# Patient Record
Sex: Female | Born: 1945
Health system: Southern US, Community
[De-identification: ages and names within clinical notes are randomized; demographics above are authoritative.]

## PROBLEM LIST (undated history)

## (undated) DIAGNOSIS — R519 Headache, unspecified: Secondary | ICD-10-CM

## (undated) DIAGNOSIS — K589 Irritable bowel syndrome without diarrhea: Secondary | ICD-10-CM

## (undated) DIAGNOSIS — M1990 Unspecified osteoarthritis, unspecified site: Secondary | ICD-10-CM

## (undated) DIAGNOSIS — E039 Hypothyroidism, unspecified: Secondary | ICD-10-CM

## (undated) DIAGNOSIS — M2240 Chondromalacia patellae, unspecified knee: Secondary | ICD-10-CM

## (undated) DIAGNOSIS — E119 Type 2 diabetes mellitus without complications: Secondary | ICD-10-CM

## (undated) DIAGNOSIS — J40 Bronchitis, not specified as acute or chronic: Secondary | ICD-10-CM

## (undated) DIAGNOSIS — F419 Anxiety disorder, unspecified: Secondary | ICD-10-CM

## (undated) DIAGNOSIS — T4145XA Adverse effect of unspecified anesthetic, initial encounter: Secondary | ICD-10-CM

## (undated) DIAGNOSIS — I10 Essential (primary) hypertension: Secondary | ICD-10-CM

## (undated) DIAGNOSIS — G2581 Restless legs syndrome: Secondary | ICD-10-CM

## (undated) DIAGNOSIS — T8859XA Other complications of anesthesia, initial encounter: Secondary | ICD-10-CM

## (undated) DIAGNOSIS — K219 Gastro-esophageal reflux disease without esophagitis: Secondary | ICD-10-CM

## (undated) DIAGNOSIS — R51 Headache: Secondary | ICD-10-CM

## (undated) HISTORY — PX: EYE SURGERY: SHX253

## (undated) HISTORY — PX: ABDOMINAL HYSTERECTOMY: SHX81

## (undated) HISTORY — PX: BREAST SURGERY: SHX581

---
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
## 1986-10-02 HISTORY — PX: BREAST EXCISIONAL BIOPSY: SUR124

## 2004-05-11 LAB — HM COLONOSCOPY

## 2005-06-12 ENCOUNTER — Ambulatory Visit: Payer: Self-pay | Admitting: Family Medicine

## 2006-06-19 ENCOUNTER — Ambulatory Visit: Payer: Self-pay | Admitting: Family Medicine

## 2007-06-25 ENCOUNTER — Ambulatory Visit: Payer: Self-pay | Admitting: Family Medicine

## 2008-09-17 ENCOUNTER — Ambulatory Visit: Payer: Self-pay | Admitting: Family Medicine

## 2009-10-04 ENCOUNTER — Ambulatory Visit: Payer: Self-pay | Admitting: Family Medicine

## 2010-01-18 ENCOUNTER — Ambulatory Visit: Payer: Self-pay | Admitting: Otolaryngology

## 2010-10-17 ENCOUNTER — Ambulatory Visit: Payer: Self-pay | Admitting: Family Medicine

## 2011-12-21 DIAGNOSIS — E7800 Pure hypercholesterolemia, unspecified: Secondary | ICD-10-CM | POA: Diagnosis not present

## 2011-12-21 DIAGNOSIS — G43909 Migraine, unspecified, not intractable, without status migrainosus: Secondary | ICD-10-CM | POA: Diagnosis not present

## 2011-12-21 DIAGNOSIS — E119 Type 2 diabetes mellitus without complications: Secondary | ICD-10-CM | POA: Diagnosis not present

## 2011-12-21 DIAGNOSIS — I10 Essential (primary) hypertension: Secondary | ICD-10-CM | POA: Diagnosis not present

## 2012-01-03 ENCOUNTER — Ambulatory Visit: Payer: Self-pay | Admitting: Family Medicine

## 2012-01-03 DIAGNOSIS — Z1231 Encounter for screening mammogram for malignant neoplasm of breast: Secondary | ICD-10-CM | POA: Diagnosis not present

## 2012-03-20 DIAGNOSIS — G2581 Restless legs syndrome: Secondary | ICD-10-CM | POA: Diagnosis not present

## 2012-03-20 DIAGNOSIS — G43909 Migraine, unspecified, not intractable, without status migrainosus: Secondary | ICD-10-CM | POA: Diagnosis not present

## 2012-03-20 DIAGNOSIS — E119 Type 2 diabetes mellitus without complications: Secondary | ICD-10-CM | POA: Diagnosis not present

## 2012-03-20 DIAGNOSIS — I10 Essential (primary) hypertension: Secondary | ICD-10-CM | POA: Diagnosis not present

## 2012-06-20 DIAGNOSIS — E109 Type 1 diabetes mellitus without complications: Secondary | ICD-10-CM | POA: Diagnosis not present

## 2012-06-20 DIAGNOSIS — F411 Generalized anxiety disorder: Secondary | ICD-10-CM | POA: Diagnosis not present

## 2012-06-20 DIAGNOSIS — J069 Acute upper respiratory infection, unspecified: Secondary | ICD-10-CM | POA: Diagnosis not present

## 2012-06-20 DIAGNOSIS — G43909 Migraine, unspecified, not intractable, without status migrainosus: Secondary | ICD-10-CM | POA: Diagnosis not present

## 2012-08-06 DIAGNOSIS — Z23 Encounter for immunization: Secondary | ICD-10-CM | POA: Diagnosis not present

## 2012-09-11 DIAGNOSIS — H2510 Age-related nuclear cataract, unspecified eye: Secondary | ICD-10-CM | POA: Diagnosis not present

## 2012-09-19 DIAGNOSIS — E7800 Pure hypercholesterolemia, unspecified: Secondary | ICD-10-CM | POA: Diagnosis not present

## 2012-09-19 DIAGNOSIS — G2581 Restless legs syndrome: Secondary | ICD-10-CM | POA: Diagnosis not present

## 2012-09-19 DIAGNOSIS — E109 Type 1 diabetes mellitus without complications: Secondary | ICD-10-CM | POA: Diagnosis not present

## 2012-09-19 DIAGNOSIS — F17200 Nicotine dependence, unspecified, uncomplicated: Secondary | ICD-10-CM | POA: Diagnosis not present

## 2012-09-23 DIAGNOSIS — I10 Essential (primary) hypertension: Secondary | ICD-10-CM | POA: Diagnosis not present

## 2012-09-23 DIAGNOSIS — R5381 Other malaise: Secondary | ICD-10-CM | POA: Diagnosis not present

## 2012-09-23 DIAGNOSIS — E785 Hyperlipidemia, unspecified: Secondary | ICD-10-CM | POA: Diagnosis not present

## 2013-01-08 DIAGNOSIS — E7800 Pure hypercholesterolemia, unspecified: Secondary | ICD-10-CM | POA: Diagnosis not present

## 2013-01-08 DIAGNOSIS — E109 Type 1 diabetes mellitus without complications: Secondary | ICD-10-CM | POA: Diagnosis not present

## 2013-01-08 DIAGNOSIS — Z0000 Encounter for general adult medical examination without abnormal findings: Secondary | ICD-10-CM | POA: Diagnosis not present

## 2013-01-08 DIAGNOSIS — G2581 Restless legs syndrome: Secondary | ICD-10-CM | POA: Diagnosis not present

## 2013-01-08 LAB — HM PAP SMEAR: HM Pap smear: NORMAL

## 2013-01-16 DIAGNOSIS — I10 Essential (primary) hypertension: Secondary | ICD-10-CM | POA: Diagnosis not present

## 2013-01-16 DIAGNOSIS — G2581 Restless legs syndrome: Secondary | ICD-10-CM | POA: Diagnosis not present

## 2013-01-16 DIAGNOSIS — E119 Type 2 diabetes mellitus without complications: Secondary | ICD-10-CM | POA: Diagnosis not present

## 2013-01-16 DIAGNOSIS — Z23 Encounter for immunization: Secondary | ICD-10-CM | POA: Diagnosis not present

## 2013-01-16 DIAGNOSIS — F17200 Nicotine dependence, unspecified, uncomplicated: Secondary | ICD-10-CM | POA: Diagnosis not present

## 2013-02-11 ENCOUNTER — Ambulatory Visit: Payer: Self-pay | Admitting: Family Medicine

## 2013-02-11 DIAGNOSIS — Z803 Family history of malignant neoplasm of breast: Secondary | ICD-10-CM | POA: Diagnosis not present

## 2013-02-11 DIAGNOSIS — Z1231 Encounter for screening mammogram for malignant neoplasm of breast: Secondary | ICD-10-CM | POA: Diagnosis not present

## 2013-05-22 DIAGNOSIS — E109 Type 1 diabetes mellitus without complications: Secondary | ICD-10-CM | POA: Diagnosis not present

## 2013-05-22 DIAGNOSIS — G2581 Restless legs syndrome: Secondary | ICD-10-CM | POA: Diagnosis not present

## 2013-05-22 DIAGNOSIS — F411 Generalized anxiety disorder: Secondary | ICD-10-CM | POA: Diagnosis not present

## 2013-05-22 DIAGNOSIS — I10 Essential (primary) hypertension: Secondary | ICD-10-CM | POA: Diagnosis not present

## 2013-06-10 ENCOUNTER — Ambulatory Visit: Payer: Self-pay | Admitting: Family Medicine

## 2013-06-10 DIAGNOSIS — M899 Disorder of bone, unspecified: Secondary | ICD-10-CM | POA: Diagnosis not present

## 2013-06-10 LAB — HM DEXA SCAN

## 2013-07-01 DIAGNOSIS — Z23 Encounter for immunization: Secondary | ICD-10-CM | POA: Diagnosis not present

## 2013-09-30 DIAGNOSIS — E109 Type 1 diabetes mellitus without complications: Secondary | ICD-10-CM | POA: Diagnosis not present

## 2013-09-30 DIAGNOSIS — G43909 Migraine, unspecified, not intractable, without status migrainosus: Secondary | ICD-10-CM | POA: Diagnosis not present

## 2013-09-30 DIAGNOSIS — F17200 Nicotine dependence, unspecified, uncomplicated: Secondary | ICD-10-CM | POA: Diagnosis not present

## 2013-09-30 DIAGNOSIS — G2581 Restless legs syndrome: Secondary | ICD-10-CM | POA: Diagnosis not present

## 2013-10-06 DIAGNOSIS — E785 Hyperlipidemia, unspecified: Secondary | ICD-10-CM | POA: Diagnosis not present

## 2013-10-09 DIAGNOSIS — H2510 Age-related nuclear cataract, unspecified eye: Secondary | ICD-10-CM | POA: Diagnosis not present

## 2014-01-12 DIAGNOSIS — G2581 Restless legs syndrome: Secondary | ICD-10-CM | POA: Diagnosis not present

## 2014-01-12 DIAGNOSIS — G43909 Migraine, unspecified, not intractable, without status migrainosus: Secondary | ICD-10-CM | POA: Diagnosis not present

## 2014-01-12 DIAGNOSIS — E109 Type 1 diabetes mellitus without complications: Secondary | ICD-10-CM | POA: Diagnosis not present

## 2014-01-12 DIAGNOSIS — J441 Chronic obstructive pulmonary disease with (acute) exacerbation: Secondary | ICD-10-CM | POA: Diagnosis not present

## 2014-03-26 ENCOUNTER — Ambulatory Visit: Payer: Self-pay | Admitting: Family Medicine

## 2014-03-26 DIAGNOSIS — Z1231 Encounter for screening mammogram for malignant neoplasm of breast: Secondary | ICD-10-CM | POA: Diagnosis not present

## 2014-03-26 LAB — HM MAMMOGRAPHY: HM MAMMO: NORMAL

## 2014-07-07 DIAGNOSIS — Z23 Encounter for immunization: Secondary | ICD-10-CM | POA: Diagnosis not present

## 2014-07-21 DIAGNOSIS — E119 Type 2 diabetes mellitus without complications: Secondary | ICD-10-CM | POA: Diagnosis not present

## 2014-07-21 DIAGNOSIS — F410 Panic disorder [episodic paroxysmal anxiety] without agoraphobia: Secondary | ICD-10-CM | POA: Diagnosis not present

## 2014-08-31 ENCOUNTER — Ambulatory Visit: Payer: Self-pay | Admitting: Family Medicine

## 2014-08-31 DIAGNOSIS — M25512 Pain in left shoulder: Secondary | ICD-10-CM | POA: Diagnosis not present

## 2014-08-31 DIAGNOSIS — M25511 Pain in right shoulder: Secondary | ICD-10-CM | POA: Diagnosis not present

## 2014-08-31 DIAGNOSIS — M5032 Other cervical disc degeneration, mid-cervical region: Secondary | ICD-10-CM | POA: Diagnosis not present

## 2014-08-31 DIAGNOSIS — M542 Cervicalgia: Secondary | ICD-10-CM | POA: Diagnosis not present

## 2014-08-31 DIAGNOSIS — E119 Type 2 diabetes mellitus without complications: Secondary | ICD-10-CM | POA: Diagnosis not present

## 2014-08-31 DIAGNOSIS — Z23 Encounter for immunization: Secondary | ICD-10-CM | POA: Diagnosis not present

## 2014-08-31 DIAGNOSIS — G8929 Other chronic pain: Secondary | ICD-10-CM | POA: Diagnosis not present

## 2014-08-31 DIAGNOSIS — M5012 Cervical disc disorder with radiculopathy, mid-cervical region: Secondary | ICD-10-CM | POA: Diagnosis not present

## 2014-08-31 DIAGNOSIS — M5410 Radiculopathy, site unspecified: Secondary | ICD-10-CM | POA: Diagnosis not present

## 2014-11-19 DIAGNOSIS — J441 Chronic obstructive pulmonary disease with (acute) exacerbation: Secondary | ICD-10-CM | POA: Diagnosis not present

## 2014-11-19 DIAGNOSIS — M1990 Unspecified osteoarthritis, unspecified site: Secondary | ICD-10-CM | POA: Diagnosis not present

## 2014-11-19 DIAGNOSIS — Z23 Encounter for immunization: Secondary | ICD-10-CM | POA: Diagnosis not present

## 2014-11-19 DIAGNOSIS — E109 Type 1 diabetes mellitus without complications: Secondary | ICD-10-CM | POA: Diagnosis not present

## 2014-11-19 LAB — HEMOGLOBIN A1C: HEMOGLOBIN A1C: 5.9 % (ref 4.0–6.0)

## 2014-11-26 DIAGNOSIS — M7541 Impingement syndrome of right shoulder: Secondary | ICD-10-CM | POA: Diagnosis not present

## 2014-11-26 DIAGNOSIS — M7542 Impingement syndrome of left shoulder: Secondary | ICD-10-CM | POA: Diagnosis not present

## 2014-12-09 DIAGNOSIS — M25511 Pain in right shoulder: Secondary | ICD-10-CM | POA: Diagnosis not present

## 2014-12-09 DIAGNOSIS — M25512 Pain in left shoulder: Secondary | ICD-10-CM | POA: Diagnosis not present

## 2014-12-09 DIAGNOSIS — M25612 Stiffness of left shoulder, not elsewhere classified: Secondary | ICD-10-CM | POA: Diagnosis not present

## 2014-12-09 DIAGNOSIS — M7541 Impingement syndrome of right shoulder: Secondary | ICD-10-CM | POA: Diagnosis not present

## 2014-12-09 DIAGNOSIS — M25611 Stiffness of right shoulder, not elsewhere classified: Secondary | ICD-10-CM | POA: Diagnosis not present

## 2014-12-09 DIAGNOSIS — M7542 Impingement syndrome of left shoulder: Secondary | ICD-10-CM | POA: Diagnosis not present

## 2014-12-11 DIAGNOSIS — M25512 Pain in left shoulder: Secondary | ICD-10-CM | POA: Diagnosis not present

## 2014-12-11 DIAGNOSIS — M25611 Stiffness of right shoulder, not elsewhere classified: Secondary | ICD-10-CM | POA: Diagnosis not present

## 2014-12-11 DIAGNOSIS — M25511 Pain in right shoulder: Secondary | ICD-10-CM | POA: Diagnosis not present

## 2014-12-11 DIAGNOSIS — M25612 Stiffness of left shoulder, not elsewhere classified: Secondary | ICD-10-CM | POA: Diagnosis not present

## 2014-12-11 DIAGNOSIS — M7541 Impingement syndrome of right shoulder: Secondary | ICD-10-CM | POA: Diagnosis not present

## 2014-12-11 DIAGNOSIS — M7542 Impingement syndrome of left shoulder: Secondary | ICD-10-CM | POA: Diagnosis not present

## 2014-12-14 DIAGNOSIS — M25611 Stiffness of right shoulder, not elsewhere classified: Secondary | ICD-10-CM | POA: Diagnosis not present

## 2014-12-14 DIAGNOSIS — M25511 Pain in right shoulder: Secondary | ICD-10-CM | POA: Diagnosis not present

## 2014-12-14 DIAGNOSIS — M7542 Impingement syndrome of left shoulder: Secondary | ICD-10-CM | POA: Diagnosis not present

## 2014-12-14 DIAGNOSIS — M25512 Pain in left shoulder: Secondary | ICD-10-CM | POA: Diagnosis not present

## 2014-12-14 DIAGNOSIS — M25612 Stiffness of left shoulder, not elsewhere classified: Secondary | ICD-10-CM | POA: Diagnosis not present

## 2014-12-14 DIAGNOSIS — M7541 Impingement syndrome of right shoulder: Secondary | ICD-10-CM | POA: Diagnosis not present

## 2014-12-23 DIAGNOSIS — M7542 Impingement syndrome of left shoulder: Secondary | ICD-10-CM | POA: Diagnosis not present

## 2014-12-23 DIAGNOSIS — M25612 Stiffness of left shoulder, not elsewhere classified: Secondary | ICD-10-CM | POA: Diagnosis not present

## 2014-12-23 DIAGNOSIS — M7541 Impingement syndrome of right shoulder: Secondary | ICD-10-CM | POA: Diagnosis not present

## 2014-12-23 DIAGNOSIS — M25611 Stiffness of right shoulder, not elsewhere classified: Secondary | ICD-10-CM | POA: Diagnosis not present

## 2014-12-23 DIAGNOSIS — M25511 Pain in right shoulder: Secondary | ICD-10-CM | POA: Diagnosis not present

## 2014-12-23 DIAGNOSIS — M25512 Pain in left shoulder: Secondary | ICD-10-CM | POA: Diagnosis not present

## 2014-12-24 DIAGNOSIS — I10 Essential (primary) hypertension: Secondary | ICD-10-CM | POA: Diagnosis not present

## 2014-12-24 DIAGNOSIS — M1990 Unspecified osteoarthritis, unspecified site: Secondary | ICD-10-CM | POA: Diagnosis not present

## 2014-12-24 DIAGNOSIS — E119 Type 2 diabetes mellitus without complications: Secondary | ICD-10-CM | POA: Diagnosis not present

## 2014-12-24 DIAGNOSIS — R05 Cough: Secondary | ICD-10-CM | POA: Diagnosis not present

## 2014-12-29 DIAGNOSIS — E785 Hyperlipidemia, unspecified: Secondary | ICD-10-CM | POA: Diagnosis not present

## 2014-12-29 DIAGNOSIS — R5381 Other malaise: Secondary | ICD-10-CM | POA: Diagnosis not present

## 2014-12-29 LAB — LIPID PANEL
Cholesterol: 174 mg/dL (ref 0–200)
HDL: 71 mg/dL — AB (ref 35–70)
LDL Cholesterol: 95 mg/dL
LDl/HDL Ratio: 1.3
TRIGLYCERIDES: 39 mg/dL — AB (ref 40–160)

## 2014-12-29 LAB — BASIC METABOLIC PANEL
BUN: 23 mg/dL — AB (ref 4–21)
Creatinine: 1.1 mg/dL (ref <=1.1)
GLUCOSE: 134 mg/dL
Potassium: 5 mmol/L (ref 3.4–5.3)
SODIUM: 144 mmol/L (ref 137–147)

## 2014-12-29 LAB — HEPATIC FUNCTION PANEL
ALK PHOS: 81 U/L (ref 25–125)
ALT: 6 U/L — AB (ref 7–35)
AST: 12 U/L — AB (ref 13–35)
Bilirubin, Total: 0.2 mg/dL

## 2014-12-29 LAB — CBC AND DIFFERENTIAL
HCT: 40 % (ref 36–46)
Hemoglobin: 13 g/dL (ref 12.0–16.0)
Neutrophils Absolute: 49 /uL
PLATELETS: 325 10*3/uL (ref 150–399)
WBC: 5.8 10^3/mL

## 2014-12-29 LAB — TSH: TSH: 6.85 u[IU]/mL — AB (ref <=5.90)

## 2015-01-07 DIAGNOSIS — M25511 Pain in right shoulder: Secondary | ICD-10-CM | POA: Diagnosis not present

## 2015-01-07 DIAGNOSIS — M25611 Stiffness of right shoulder, not elsewhere classified: Secondary | ICD-10-CM | POA: Diagnosis not present

## 2015-01-07 DIAGNOSIS — M25512 Pain in left shoulder: Secondary | ICD-10-CM | POA: Diagnosis not present

## 2015-01-07 DIAGNOSIS — M7542 Impingement syndrome of left shoulder: Secondary | ICD-10-CM | POA: Diagnosis not present

## 2015-01-07 DIAGNOSIS — M25612 Stiffness of left shoulder, not elsewhere classified: Secondary | ICD-10-CM | POA: Diagnosis not present

## 2015-01-07 DIAGNOSIS — M7541 Impingement syndrome of right shoulder: Secondary | ICD-10-CM | POA: Diagnosis not present

## 2015-01-14 DIAGNOSIS — M7541 Impingement syndrome of right shoulder: Secondary | ICD-10-CM | POA: Diagnosis not present

## 2015-02-05 DIAGNOSIS — G2581 Restless legs syndrome: Secondary | ICD-10-CM | POA: Insufficient documentation

## 2015-02-05 DIAGNOSIS — E785 Hyperlipidemia, unspecified: Secondary | ICD-10-CM | POA: Insufficient documentation

## 2015-02-05 DIAGNOSIS — K219 Gastro-esophageal reflux disease without esophagitis: Secondary | ICD-10-CM | POA: Insufficient documentation

## 2015-02-05 DIAGNOSIS — N183 Chronic kidney disease, stage 3 unspecified: Secondary | ICD-10-CM | POA: Insufficient documentation

## 2015-02-05 DIAGNOSIS — N6019 Diffuse cystic mastopathy of unspecified breast: Secondary | ICD-10-CM | POA: Insufficient documentation

## 2015-02-05 DIAGNOSIS — M2240 Chondromalacia patellae, unspecified knee: Secondary | ICD-10-CM | POA: Insufficient documentation

## 2015-02-05 DIAGNOSIS — Z87891 Personal history of nicotine dependence: Secondary | ICD-10-CM | POA: Insufficient documentation

## 2015-02-05 DIAGNOSIS — K589 Irritable bowel syndrome without diarrhea: Secondary | ICD-10-CM | POA: Insufficient documentation

## 2015-02-05 DIAGNOSIS — M1990 Unspecified osteoarthritis, unspecified site: Secondary | ICD-10-CM | POA: Insufficient documentation

## 2015-02-05 DIAGNOSIS — E039 Hypothyroidism, unspecified: Secondary | ICD-10-CM | POA: Insufficient documentation

## 2015-02-05 DIAGNOSIS — I10 Essential (primary) hypertension: Secondary | ICD-10-CM | POA: Insufficient documentation

## 2015-02-05 DIAGNOSIS — E109 Type 1 diabetes mellitus without complications: Secondary | ICD-10-CM | POA: Insufficient documentation

## 2015-02-05 DIAGNOSIS — F419 Anxiety disorder, unspecified: Secondary | ICD-10-CM | POA: Insufficient documentation

## 2015-02-05 DIAGNOSIS — I878 Other specified disorders of veins: Secondary | ICD-10-CM | POA: Insufficient documentation

## 2015-02-05 DIAGNOSIS — M8580 Other specified disorders of bone density and structure, unspecified site: Secondary | ICD-10-CM | POA: Insufficient documentation

## 2015-02-05 DIAGNOSIS — J441 Chronic obstructive pulmonary disease with (acute) exacerbation: Secondary | ICD-10-CM | POA: Insufficient documentation

## 2015-02-05 DIAGNOSIS — J309 Allergic rhinitis, unspecified: Secondary | ICD-10-CM | POA: Insufficient documentation

## 2015-03-23 ENCOUNTER — Ambulatory Visit (INDEPENDENT_AMBULATORY_CARE_PROVIDER_SITE_OTHER): Payer: Medicare Other | Admitting: Family Medicine

## 2015-03-23 ENCOUNTER — Encounter: Payer: Self-pay | Admitting: Family Medicine

## 2015-03-23 VITALS — BP 142/64 | HR 66 | Temp 98.3°F | Resp 12 | Ht 61.0 in | Wt 125.0 lb

## 2015-03-23 DIAGNOSIS — E109 Type 1 diabetes mellitus without complications: Secondary | ICD-10-CM | POA: Diagnosis not present

## 2015-03-23 DIAGNOSIS — Z87891 Personal history of nicotine dependence: Secondary | ICD-10-CM | POA: Diagnosis not present

## 2015-03-23 DIAGNOSIS — Z0000 Encounter for general adult medical examination without abnormal findings: Secondary | ICD-10-CM

## 2015-03-23 DIAGNOSIS — Z1211 Encounter for screening for malignant neoplasm of colon: Secondary | ICD-10-CM | POA: Diagnosis not present

## 2015-03-23 DIAGNOSIS — I10 Essential (primary) hypertension: Secondary | ICD-10-CM

## 2015-03-23 DIAGNOSIS — E038 Other specified hypothyroidism: Secondary | ICD-10-CM

## 2015-03-23 LAB — POCT UA - MICROALBUMIN: Microalbumin Ur, POC: 20 mg/L

## 2015-03-23 NOTE — Progress Notes (Signed)
Patient ID: WHITNIE DELEON, female   DOB: 02-22-1946, 69 y.o.   MRN: [id number] Patient: Debbie Dennis, Female    DOB: 09-Mar-1946, 69 y.o.   MRN: [id number] Visit Date: 03/23/2015  Today's Provider: Wilhemena Durie, MD   Chief Complaint  Patient presents with  . Medicare Wellness   Subjective:    Annual wellness visit Debbie Dennis is a 69 y.o. female who presents today for her Subsequent Annual Wellness Visit. She feels fairly well. She reports exercising yes. She reports she is sleeping poorly. Due to arm/shouler pain. Her last pap smear done in April 2014 and it was normal-she has had abdominal hysterectomy and still has ovaries. [id number]. Colonoscopy 2005. Mammogram-03/2014. She is up to date on immunizations at this time.  Type 1 diabetes since age 85. All risk factors are treated. Stress to the patient of hypoglycemia this to be avoided as she gets older. Overall patient is doing well. Her husband is having severe anxiety problems and for this reason she retired in the past year. He is actually been referred from local psychiatrist to a psychiatrist in [place] for this issue.  Review of Systems  Constitutional: Negative.   HENT: Negative.   Eyes: Negative.   Respiratory: Negative.   Cardiovascular: Negative.   Gastrointestinal: Negative.   Endocrine: Negative.   Genitourinary: Negative.   Musculoskeletal: Positive for myalgias and arthralgias. Negative for back pain and gait problem.  Skin: Negative.   Allergic/Immunologic: Negative.   Neurological: Positive for numbness (in her legs).  Hematological: Negative.   Psychiatric/Behavioral: Negative.     History   Social History  . Marital Status: Married    Spouse Name: Debbie Dennis  . Number of Children: 1  . Years of Education: 12   Occupational History  . retired    Social History Main Topics  . Smoking status: Former Smoker -- 1.00 packs/day for 35 years    Types: Cigarettes    Quit date: 10/02/2012  . Smokeless tobacco:  Never Used  . Alcohol Use: Yes     Comment: rarely  . Drug Use: No  . Sexual Activity: Not Currently    Birth Control/ Protection: None   Other Topics Concern  . Not on file   Social History Narrative    Patient Active Problem List   Diagnosis Date Noted  . Anxiety 02/05/2015  . Allergic rhinitis 02/05/2015  . Arthritis 02/05/2015  . Acute exacerbation of chronic obstructive airways disease 02/05/2015  . 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 Insulin dependent type 1 diabetes mellitus 02/05/2015  . Bloodgood disease 02/05/2015  . Acid reflux 02/05/2015  . HLD (hyperlipidemia) 02/05/2015  . BP (high blood pressure) 02/05/2015  . Adult hypothyroidism 02/05/2015  . Adaptive colitis 02/05/2015  . Osteopenia 02/05/2015  . Restless leg 02/05/2015  . Current smoker 02/05/2015  . Venous stasis syndrome 02/05/2015    Past Surgical History  Procedure Laterality Date  . Breast biopsy    . Abdominal hysterectomy      has ovaries-due to abnormal pap smears    Her family history includes Arthritis in her sister; Asthma in her maternal grandfather; Breast cancer in her sister; COPD in her father; Cancer in her mother; Heart attack in her mother; Heart disease in her father; Lung cancer in her brother; Throat cancer in her father.    Previous Medications   ALPRAZOLAM (XANAX) 0.5 MG TABLET    Take by mouth.   BUTALBITAL-APAP-CAFFEINE 50-325-40 MG PER CAPSULE    Take by  mouth.   INSULIN GLARGINE (LANTUS) 100 UNIT/ML INJECTION    Inject into the skin. 8 units at night   INSULIN LISPRO (HUMALOG) 100 UNIT/ML INJECTION    Inject into the skin. Sliding scale   LEVOTHYROXINE (SYNTHROID, LEVOTHROID) 50 MCG TABLET    Take by mouth.   LORATADINE (CLARITIN) 10 MG TABLET    Take by mouth.   MECLIZINE (ANTIVERT) 25 MG TABLET    Take by mouth.   MELOXICAM (MOBIC) 7.5 MG TABLET    Take by mouth.   RANITIDINE (ZANTAC) 75 MG TABLET    Take by mouth.   TOPIRAMATE (TOPAMAX) 50 MG TABLET     Take by mouth 2 (two) times daily.     Patient Care Team: Jerrol Banana., MD as PCP - General (Family Medicine)     Objective:   Vitals: BP 142/64 mmHg  Pulse 66  Temp(Src) 98.3 F (36.8 C)  Resp 12  Ht 5\' 1"  (1.549 m)  Wt 125 lb (56.7 kg)  BMI 23.63 kg/m2  Physical Exam  Constitutional: She is oriented to person, place, and time. She appears well-developed and well-nourished. No distress.  HENT:  Head: Normocephalic and atraumatic.  Right Ear: External ear normal.  Left Ear: External ear normal.  Nose: Nose normal.  Mouth/Throat: Oropharynx is clear and moist.  Eyes: Conjunctivae and EOM are normal. Pupils are equal, round, and reactive to light.  Neck: Normal range of motion. Neck supple.  Cardiovascular: Normal rate, regular rhythm, normal heart sounds and intact distal pulses.  Exam reveals no friction rub.   No murmur heard. Pulmonary/Chest: Effort normal and breath sounds normal. She has no wheezes. She exhibits no tenderness. Right breast exhibits no inverted nipple, no mass and no nipple discharge. Left breast exhibits no inverted nipple, no mass and no nipple discharge.  Abdominal: Soft. Bowel sounds are normal. She exhibits no distension. There is no tenderness.  Musculoskeletal: Normal range of motion. She exhibits no edema or tenderness.  Neurological: She is alert and oriented to person, place, and time.  Skin: Skin is warm and dry.  Psychiatric: She has a normal mood and affect. Her behavior is normal. Judgment and thought content normal.    Activities of Daily Living In your present state of health, do you have any difficulty performing the following activities: 03/23/2015 03/23/2015  Hearing? N N  Vision? N N  Difficulty concentrating or making decisions? N N  Walking or climbing stairs? N N  Dressing or bathing? N N  Doing errands, shopping? N N    Fall Risk Assessment Fall Risk  03/23/2015  Falls in the past year? No     Depression  Screen PHQ 2/9 Scores 03/23/2015  PHQ - 2 Score 2  PHQ- 9 Score 4    Cognitive Testing - 6-CIT  Correct? Score   What year is it? yes 0 0 or 4  What month is it? yes 0 0 or 3  Memorize:    Debbie Dennis,  43,  [place],      What time is it? (within 1 hour) yes 0 0 or 3  Count backwards from 20 yes 0 0, 2, or 4  Name the months of the year yes 0 0, 2, or 4  Repeat name & address above no 8 0, 2, 4, 6, 8, or 10       TOTAL SCORE  8/28   Interpretation:  Normal  Normal (0-7) Abnormal (8-28)  Assessment & Plan:     Annual Wellness Visit  Reviewed patient's Family Medical History Reviewed and updated list of patient's medical providers Assessment of cognitive impairment was done Assessed patient's functional ability Established a written schedule for health screening [place] Completed and Reviewed   Immunization History  Administered Date(s) Administered  . Pneumococcal Conjugate-13 08/31/2014  . Pneumococcal Polysaccharide-23 07/11/2007, 01/16/2013  . Tdap 08/25/2008  . Zoster 03/02/2012    Health Maintenance  Topic Date Due  . FOOT EXAM  12/07/1956  . OPHTHALMOLOGY EXAM  12/07/1956  . URINE MICROALBUMIN  12/07/1956  . COLONOSCOPY  05/11/2014  . INFLUENZA VACCINE  05/03/2015  . HEMOGLOBIN A1C  05/20/2015  . MAMMOGRAM  03/26/2016  . TETANUS/TDAP  08/25/2018  . DEXA SCAN  Completed  . ZOSTAVAX  Completed  . PNA vac Low Risk Adult  Completed   BMD in September 2016   Discussed health benefits of physical activity, and encouraged her to engage in regular exercise appropriate for her age and condition.  1. Medicare annual wellness visit, subsequent  2. Colon cancer screening Refer for colonoscopy. - Ambulatory referral to Gastroenterology  3. Essential hypertension Stable.  4. Insulin dependent type 1 diabetes mellitus Urine microalbumin checked today-20. - HgB A1c Type 1 diabetes since age 77. 5. Other specified  hypothyroidism  - TSH  6. History of tobacco abuse  7. Mild cognitive impairment Will follow. Presently normal. Post partial hysterectomy

## 2015-03-24 LAB — HEMOGLOBIN A1C
Est. average glucose Bld gHb Est-mCnc: 131 mg/dL
Hgb A1c MFr Bld: 6.2 % — ABNORMAL HIGH (ref 4.8–5.6)

## 2015-03-24 LAB — TSH: TSH: 1.23 u[IU]/mL (ref 0.450–4.500)

## 2015-03-25 NOTE — Progress Notes (Signed)
Pt advised-aa 

## 2015-03-29 ENCOUNTER — Other Ambulatory Visit: Payer: Self-pay | Admitting: Family Medicine

## 2015-04-12 ENCOUNTER — Other Ambulatory Visit: Payer: Self-pay

## 2015-04-12 MED ORDER — GLUCOSE BLOOD VI STRP: ORAL_STRIP | Status: DC

## 2015-04-14 ENCOUNTER — Other Ambulatory Visit: Payer: Self-pay

## 2015-04-14 ENCOUNTER — Telehealth: Payer: Self-pay | Admitting: Family Medicine

## 2015-04-14 NOTE — Telephone Encounter (Signed)
Needs one touch strips at [employer].

## 2015-04-15 ENCOUNTER — Other Ambulatory Visit: Payer: Self-pay

## 2015-04-19 ENCOUNTER — Other Ambulatory Visit: Payer: Self-pay

## 2015-04-19 MED ORDER — GLUCOSE BLOOD VI STRP: ORAL_STRIP | Status: DC

## 2015-04-28 ENCOUNTER — Other Ambulatory Visit: Payer: Self-pay | Admitting: Family Medicine

## 2015-04-29 DIAGNOSIS — Z1211 Encounter for screening for malignant neoplasm of colon: Secondary | ICD-10-CM | POA: Diagnosis not present

## 2015-05-11 ENCOUNTER — Ambulatory Visit (INDEPENDENT_AMBULATORY_CARE_PROVIDER_SITE_OTHER): Payer: Medicare Other | Admitting: Family Medicine

## 2015-05-11 ENCOUNTER — Encounter: Payer: Self-pay | Admitting: Family Medicine

## 2015-05-11 VITALS — BP 104/60 | HR 82 | Temp 97.6°F | Resp 16 | Wt 127.0 lb

## 2015-05-11 DIAGNOSIS — R05 Cough: Secondary | ICD-10-CM | POA: Diagnosis not present

## 2015-05-11 DIAGNOSIS — B3731 Acute candidiasis of vulva and vagina: Secondary | ICD-10-CM

## 2015-05-11 DIAGNOSIS — R059 Cough, unspecified: Secondary | ICD-10-CM

## 2015-05-11 DIAGNOSIS — E162 Hypoglycemia, unspecified: Secondary | ICD-10-CM | POA: Diagnosis not present

## 2015-05-11 DIAGNOSIS — B373 Candidiasis of vulva and vagina: Secondary | ICD-10-CM

## 2015-05-11 DIAGNOSIS — R4182 Altered mental status, unspecified: Secondary | ICD-10-CM | POA: Diagnosis not present

## 2015-05-11 DIAGNOSIS — K219 Gastro-esophageal reflux disease without esophagitis: Secondary | ICD-10-CM

## 2015-05-11 DIAGNOSIS — J040 Acute laryngitis: Secondary | ICD-10-CM | POA: Diagnosis not present

## 2015-05-11 DIAGNOSIS — N952 Postmenopausal atrophic vaginitis: Secondary | ICD-10-CM | POA: Diagnosis not present

## 2015-05-11 MED ORDER — FLUCONAZOLE 150 MG PO TABS: 150.0000 mg | ORAL_TABLET | Freq: Every day | ORAL | Status: DC

## 2015-05-11 MED ORDER — OMEPRAZOLE 20 MG PO CPDR: 20.0000 mg | DELAYED_RELEASE_CAPSULE | Freq: Every day | ORAL | Status: DC

## 2015-05-11 NOTE — Progress Notes (Signed)
Patient ID: Debbie Dennis, female   DOB: [birth year], 69 y.o.   MRN: [id number]    Subjective:  HPI Pt reports that she started taking Levothyroxine about 3-4 months ago. Ever since she started it she has been having vaginal irritation. She thought it was a yeast infection so she tried 3 different OTC yeast infection meds. Those did not work. She reports that is does not itch or burn it just does feel irritated and is red does not feel swollen. She tried different underwear and several other home remedities to treat this. She also reports that she has had a sore throat and hoarseness about the same time she started Levothyroxine as well. She at first thought it was her allergies. She thinks that it could be related to the medication.   Prior to Admission medications   Medication Sig Start Date End Date Taking? Authorizing Provider  ALPRAZolam Duanne Moron) 0.5 MG tablet Take by mouth. 01/14/15  Yes Historical Provider, MD  Butalbital-APAP-Caffeine [phone] MG per capsule Take by mouth. 04/23/13  Yes Historical Provider, MD  glucose blood (ONE TOUCH ULTRA TEST) test strip Patient to test six (6) times daily 04/19/15  Yes Richard Maceo Pro., MD  insulin glargine (LANTUS) 100 UNIT/ML injection Inject into the skin. 8 units at night 09/29/13  Yes Historical Provider, MD  insulin lispro (HUMALOG) 100 UNIT/ML injection Inject into the skin. Sliding scale 07/16/14  Yes Historical Provider, MD  levothyroxine (SYNTHROID, LEVOTHROID) 50 MCG tablet TAKE ONE TABLET BY MOUTH ONCE DAILY 04/28/15  Yes Jerrol Banana., MD  loratadine (CLARITIN) 10 MG tablet Take by mouth. 06/03/14  Yes Historical Provider, MD  meclizine (ANTIVERT) 25 MG tablet Take by mouth. 04/24/11  Yes Historical Provider, MD  meloxicam (MOBIC) 7.5 MG tablet Take by mouth. 08/31/14  Yes Historical Provider, MD  ranitidine (ZANTAC) 150 MG tablet TAKE ONE TABLET BY MOUTH TWICE DAILY 03/29/15  Yes Jerrol Banana., MD  ranitidine (ZANTAC) 75 MG  tablet Take by mouth. 02/02/14  Yes Historical Provider, MD  topiramate (TOPAMAX) 50 MG tablet Take by mouth 2 (two) times daily.  02/02/14  Yes Historical Provider, MD    Patient Active Problem List   Diagnosis Date Noted  . Anxiety 02/05/2015  . Allergic rhinitis 02/05/2015  . Arthritis 02/05/2015  . Acute exacerbation of chronic obstructive airways disease 02/05/2015  . 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 Insulin dependent type 1 diabetes mellitus 02/05/2015  . Bloodgood disease 02/05/2015  . Acid reflux 02/05/2015  . HLD (hyperlipidemia) 02/05/2015  . BP (high blood pressure) 02/05/2015  . Adult hypothyroidism 02/05/2015  . Adaptive colitis 02/05/2015  . Osteopenia 02/05/2015  . Restless leg 02/05/2015  . History of tobacco abuse 02/05/2015  . Venous stasis syndrome 02/05/2015    History reviewed. No pertinent past medical history.  History   Social History  . Marital Status: Married    Spouse Name: Sonia Side  . Number of Children: 1  . Years of Education: 12   Occupational History  . retired    Social History Main Topics  . Smoking status: Former Smoker -- 1.00 packs/day for 35 years    Types: Cigarettes    Quit date: 10/02/2012  . Smokeless tobacco: Never Used  . Alcohol Use: Yes     Comment: rarely  . Drug Use: No  . Sexual Activity: Not Currently    Birth Control/ Protection: None   Other Topics Concern  . Not on file   Social History Narrative  Allergies  Allergen Reactions  . Codeine   . Sulfa Antibiotics     Review of Systems  Constitutional: Negative.   HENT: Positive for sore throat (and hoarseness).   Eyes: Negative.   Respiratory: Negative.   Cardiovascular: Negative.   Gastrointestinal: Negative.   Genitourinary:       Vaginal irritation  Musculoskeletal: Negative.   Skin: Negative.   Neurological: Negative.   Endo/Heme/Allergies: Negative.   Psychiatric/Behavioral: Negative.     Immunization History  Administered  Date(s) Administered  . Pneumococcal Conjugate-13 08/31/2014  . Pneumococcal Polysaccharide-23 07/11/2007, 01/16/2013  . Tdap 08/25/2008  . Zoster 03/02/2012   Objective:  BP 104/60 mmHg  Pulse 82  Temp(Src) 97.6 F (36.4 C) (Oral)  Resp 16  Wt 127 lb (57.607 kg)  Physical Exam  Constitutional: She is oriented to person, place, and time and well-developed, well-nourished, and in no distress.  HENT:  Head: Normocephalic and atraumatic.  Right Ear: External ear normal.  Left Ear: External ear normal.  Nose: Nose normal.  Mouth/Throat: Oropharynx is clear and moist.  Eyes: Conjunctivae and EOM are normal. Pupils are equal, round, and reactive to light.  Neck: Normal range of motion. Neck supple.  Cardiovascular: Normal rate, regular rhythm, normal heart sounds and intact distal pulses.   Pulmonary/Chest: Effort normal and breath sounds normal.  Abdominal: Soft. Bowel sounds are normal.  Genitourinary: Vagina normal, uterus normal, cervix normal, right adnexa normal and left adnexa normal.  atrophy and mild erythema. Wet prep showed yeast  Neurological: She is alert and oriented to person, place, and time. Gait normal.  Skin: Skin is warm and dry.  Psychiatric: Mood, memory, affect and judgment normal.    Lab Results  Component Value Date   WBC 5.8 12/29/2014   HGB 13.0 12/29/2014   HCT 40 12/29/2014   PLT 325 12/29/2014   CHOL 174 12/29/2014   TRIG 39* 12/29/2014   HDL 71* 12/29/2014   LDLCALC 95 12/29/2014   TSH 1.230 03/23/2015   HGBA1C 6.2* 03/23/2015    CMP     Component Value Date/Time   NA 144 12/29/2014   K 5.0 12/29/2014   BUN 23* 12/29/2014   CREATININE 1.1 12/29/2014   AST 12* 12/29/2014   ALT 6* 12/29/2014   ALKPHOS 81 12/29/2014    Assessment and Plan :  Vaginitis Treat as yeast although exam is normal today. May need gyn referral. Hoarseness Treat as GERD for now. Refer to ENT if not improving as pt is long time smoker. [place] MD [facility] 05/11/2015 12:00 PM

## 2015-05-13 ENCOUNTER — Encounter: Payer: Self-pay | Admitting: *Deleted

## 2015-05-14 ENCOUNTER — Ambulatory Visit: Payer: Medicare Other | Admitting: Anesthesiology

## 2015-05-14 ENCOUNTER — Encounter
Admission: RE | Disposition: A | Discharge status: Home / Self Care | Payer: Self-pay | Source: Ambulatory Visit | Attending: Gastroenterology

## 2015-05-14 ENCOUNTER — Ambulatory Visit
Admission: RE | Admit: 2015-05-14 | Discharge: 2015-05-14 | Disposition: A | Discharge status: Home / Self Care | Payer: Medicare Other | Source: Ambulatory Visit | Attending: Gastroenterology | Admitting: Gastroenterology

## 2015-05-14 ENCOUNTER — Encounter: Payer: Self-pay | Admitting: Anesthesiology

## 2015-05-14 DIAGNOSIS — F419 Anxiety disorder, unspecified: Secondary | ICD-10-CM | POA: Diagnosis not present

## 2015-05-14 DIAGNOSIS — Z79899 Other long term (current) drug therapy: Secondary | ICD-10-CM | POA: Diagnosis not present

## 2015-05-14 DIAGNOSIS — K635 Polyp of colon: Secondary | ICD-10-CM | POA: Diagnosis not present

## 2015-05-14 DIAGNOSIS — Z90710 Acquired absence of both cervix and uterus: Secondary | ICD-10-CM | POA: Diagnosis not present

## 2015-05-14 DIAGNOSIS — E119 Type 2 diabetes mellitus without complications: Secondary | ICD-10-CM | POA: Diagnosis not present

## 2015-05-14 DIAGNOSIS — K640 First degree hemorrhoids: Secondary | ICD-10-CM | POA: Insufficient documentation

## 2015-05-14 DIAGNOSIS — Z885 Allergy status to narcotic agent status: Secondary | ICD-10-CM | POA: Insufficient documentation

## 2015-05-14 DIAGNOSIS — Z87891 Personal history of nicotine dependence: Secondary | ICD-10-CM | POA: Diagnosis not present

## 2015-05-14 DIAGNOSIS — Z803 Family history of malignant neoplasm of breast: Secondary | ICD-10-CM | POA: Diagnosis not present

## 2015-05-14 DIAGNOSIS — Z801 Family history of malignant neoplasm of trachea, bronchus and lung: Secondary | ICD-10-CM | POA: Insufficient documentation

## 2015-05-14 DIAGNOSIS — E039 Hypothyroidism, unspecified: Secondary | ICD-10-CM | POA: Insufficient documentation

## 2015-05-14 DIAGNOSIS — K648 Other hemorrhoids: Secondary | ICD-10-CM | POA: Diagnosis not present

## 2015-05-14 DIAGNOSIS — Z836 Family history of other diseases of the respiratory system: Secondary | ICD-10-CM | POA: Diagnosis not present

## 2015-05-14 DIAGNOSIS — M1990 Unspecified osteoarthritis, unspecified site: Secondary | ICD-10-CM | POA: Insufficient documentation

## 2015-05-14 DIAGNOSIS — Z8261 Family history of arthritis: Secondary | ICD-10-CM | POA: Diagnosis not present

## 2015-05-14 DIAGNOSIS — Z800 Family history of malignant neoplasm of digestive organs: Secondary | ICD-10-CM | POA: Insufficient documentation

## 2015-05-14 DIAGNOSIS — Z825 Family history of asthma and other chronic lower respiratory diseases: Secondary | ICD-10-CM | POA: Diagnosis not present

## 2015-05-14 DIAGNOSIS — D127 Benign neoplasm of rectosigmoid junction: Secondary | ICD-10-CM | POA: Insufficient documentation

## 2015-05-14 DIAGNOSIS — Z8249 Family history of ischemic heart disease and other diseases of the circulatory system: Secondary | ICD-10-CM | POA: Diagnosis not present

## 2015-05-14 DIAGNOSIS — Z882 Allergy status to sulfonamides status: Secondary | ICD-10-CM | POA: Insufficient documentation

## 2015-05-14 DIAGNOSIS — K644 Residual hemorrhoidal skin tags: Secondary | ICD-10-CM | POA: Insufficient documentation

## 2015-05-14 DIAGNOSIS — I25810 Atherosclerosis of coronary artery bypass graft(s) without angina pectoris: Secondary | ICD-10-CM | POA: Diagnosis not present

## 2015-05-14 DIAGNOSIS — G2581 Restless legs syndrome: Secondary | ICD-10-CM | POA: Diagnosis not present

## 2015-05-14 DIAGNOSIS — Z1211 Encounter for screening for malignant neoplasm of colon: Secondary | ICD-10-CM | POA: Insufficient documentation

## 2015-05-14 DIAGNOSIS — I10 Essential (primary) hypertension: Secondary | ICD-10-CM | POA: Insufficient documentation

## 2015-05-14 DIAGNOSIS — K219 Gastro-esophageal reflux disease without esophagitis: Secondary | ICD-10-CM | POA: Insufficient documentation

## 2015-05-14 DIAGNOSIS — I252 Old myocardial infarction: Secondary | ICD-10-CM | POA: Insufficient documentation

## 2015-05-14 DIAGNOSIS — Z794 Long term (current) use of insulin: Secondary | ICD-10-CM | POA: Diagnosis not present

## 2015-05-14 HISTORY — PX: COLONOSCOPY WITH PROPOFOL: SHX5780

## 2015-05-14 HISTORY — DX: Essential (primary) hypertension: I10

## 2015-05-14 HISTORY — DX: Unspecified osteoarthritis, unspecified site: M19.90

## 2015-05-14 HISTORY — DX: Type 2 diabetes mellitus without complications: E11.9

## 2015-05-14 HISTORY — DX: Irritable bowel syndrome, unspecified: K58.9

## 2015-05-14 HISTORY — DX: Anxiety disorder, unspecified: F41.9

## 2015-05-14 HISTORY — DX: Hypothyroidism, unspecified: E03.9

## 2015-05-14 HISTORY — DX: Restless legs syndrome: G25.81

## 2015-05-14 HISTORY — DX: Chondromalacia patellae, unspecified knee: M22.40

## 2015-05-14 HISTORY — DX: Gastro-esophageal reflux disease without esophagitis: K21.9

## 2015-05-14 LAB — GLUCOSE, CAPILLARY: GLUCOSE-CAPILLARY: 214 mg/dL — AB (ref 65–99)

## 2015-05-14 SURGERY — COLONOSCOPY WITH PROPOFOL
Anesthesia: General

## 2015-05-14 MED ORDER — PROPOFOL 10 MG/ML IV BOLUS
INTRAVENOUS | Status: DC | PRN
  Administered 2015-05-14: 70 mg via INTRAVENOUS

## 2015-05-14 MED ORDER — LIDOCAINE HCL (CARDIAC) 20 MG/ML IV SOLN
INTRAVENOUS | Status: DC | PRN
  Administered 2015-05-14: 50 mg via INTRAVENOUS

## 2015-05-14 MED ORDER — PROPOFOL INFUSION 10 MG/ML OPTIME
INTRAVENOUS | Status: DC | PRN
  Administered 2015-05-14: 120 ug/kg/min via INTRAVENOUS

## 2015-05-14 MED ORDER — SODIUM CHLORIDE 0.9 % IV SOLN: INTRAVENOUS | Status: DC

## 2015-05-14 MED ORDER — SODIUM CHLORIDE 0.9 % IV SOLN
INTRAVENOUS | Status: DC
  Administered 2015-05-14: 1000 mL via INTRAVENOUS

## 2015-05-14 NOTE — Anesthesia Preprocedure Evaluation (Signed)
Anesthesia Evaluation  Patient identified by MRN, date of birth, ID band Patient awake    Reviewed: Allergy & Precautions, H&P , NPO status , Patient's Chart, lab work & pertinent test results, reviewed documented beta blocker date and time   History of Anesthesia Complications Negative for: history of anesthetic complications  Airway Mallampati: I  TM Distance: >3 FB Neck ROM: full    Dental no notable dental hx. (+) Teeth Intact   Pulmonary neg shortness of breath, neg sleep apnea, COPD (mild, no treatment)neg recent URI, former smoker,  breath sounds clear to auscultation  Pulmonary exam normal       Cardiovascular Exercise Tolerance: Good hypertension, - angina+ Peripheral Vascular Disease - CAD, - Past MI and - CABG Normal cardiovascular exam- dysrhythmias - Valvular Problems/MurmursRhythm:regular Rate:Normal     Neuro/Psych negative neurological ROS  negative psych ROS   GI/Hepatic Neg liver ROS, GERD-  Medicated and Controlled,  Endo/Other  diabetes, Type 1, Insulin DependentHypothyroidism   Renal/GU negative Renal ROS  negative genitourinary   Musculoskeletal   Abdominal   Peds  Hematology negative hematology ROS (+)   Anesthesia Other Findings Past Medical History:   Anxiety                                                      Arthritis                                                    Buedinger-Ludloff-Laewen disease                             GERD (gastroesophageal reflux disease)                       Diabetes mellitus without complication                       Hypertension                                                 Hypothyroidism                                               Restless leg syndrome                                        Adaptive colitis                                             Reproductive/Obstetrics negative OB ROS  Anesthesia Physical Anesthesia Plan  ASA: III  Anesthesia Plan: General   Post-op Pain Management:    Induction:   Airway Management Planned:   Additional Equipment:   Intra-op Plan:   Post-operative Plan:   Informed Consent: I have reviewed the patients History and Physical, chart, labs and discussed the procedure including the risks, benefits and alternatives for the proposed anesthesia with the patient or authorized representative who has indicated his/her understanding and acceptance.   Dental Advisory Given  Plan Discussed with: Anesthesiologist, CRNA and Surgeon  Anesthesia Plan Comments:         Anesthesia Quick Evaluation

## 2015-05-14 NOTE — Discharge Instructions (Signed)

## 2015-05-14 NOTE — H&P (Signed)
Primary Care Physician:  Wilhemena Durie, MD  Pre-Procedure History & Physical: HPI:  Debbie Dennis is a 69 y.o. female is here for an colonoscopy.   Past Medical History  Diagnosis Date  . Anxiety   . Arthritis   . Buedinger-Ludloff-Laewen disease   . GERD (gastroesophageal reflux disease)   . Diabetes mellitus without complication   . Hypertension   . Hypothyroidism   . Restless leg syndrome   . Adaptive colitis     Past Surgical History  Procedure Laterality Date  . Breast biopsy    . Abdominal hysterectomy      has ovaries-due to abnormal pap smears    Prior to Admission medications   Medication Sig Start Date End Date Taking? Authorizing Provider  ALPRAZolam Duanne Moron) 0.5 MG tablet Take by mouth. 01/14/15  Yes Historical Provider, MD  Butalbital-APAP-Caffeine [phone] MG per capsule Take by mouth. 04/23/13  Yes Historical Provider, MD  cholecalciferol (VITAMIN D) 1000 UNITS tablet Take 1,000 Units by mouth daily.   Yes Historical Provider, MD  fluconazole (DIFLUCAN) 150 MG tablet Take 1 tablet (150 mg total) by mouth daily. For 2 days 05/11/15  Yes Richard Maceo Pro., MD  glucose blood (ONE TOUCH ULTRA TEST) test strip Patient to test six (6) times daily 04/19/15  Yes Richard Maceo Pro., MD  insulin glargine (LANTUS) 100 UNIT/ML injection Inject into the skin. 8 units at night 09/29/13  Yes Historical Provider, MD  insulin lispro (HUMALOG) 100 UNIT/ML injection Inject into the skin. Sliding scale 07/16/14  Yes Historical Provider, MD  levothyroxine (SYNTHROID, LEVOTHROID) 50 MCG tablet TAKE ONE TABLET BY MOUTH ONCE DAILY 04/28/15  Yes Jerrol Banana., MD  meclizine (ANTIVERT) 25 MG tablet Take by mouth. 04/24/11  Yes Historical Provider, MD  omeprazole (PRILOSEC) 20 MG capsule Take 1 capsule (20 mg total) by mouth daily. 05/11/15  Yes Richard Maceo Pro., MD  ranitidine (ZANTAC) 150 MG tablet TAKE ONE TABLET BY MOUTH TWICE DAILY 03/29/15  Yes Jerrol Banana., MD   ranitidine (ZANTAC) 75 MG tablet Take by mouth. 02/02/14  Yes Historical Provider, MD  topiramate (TOPAMAX) 50 MG tablet Take by mouth 2 (two) times daily.  02/02/14  Yes Historical Provider, MD  loratadine (CLARITIN) 10 MG tablet Take by mouth. 06/03/14   Historical Provider, MD  meloxicam (MOBIC) 7.5 MG tablet Take by mouth. 08/31/14   Historical Provider, MD    Allergies as of 05/02/2015 - Review Complete 03/23/2015  Allergen Reaction Noted  . Codeine  02/05/2015  . Sulfa antibiotics  02/05/2015    Family History  Problem Relation Age of Onset  . Cancer Mother     spread into her lungs-died from heart issues   . Heart attack Mother   . COPD Father   . Throat cancer Father   . Heart disease Father   . Arthritis Sister   . Breast cancer Sister   . Lung cancer Brother   . Asthma Maternal Grandfather     Social History   Social History  . Marital Status: Married    Spouse Name: Sonia Side  . Number of Children: 1  . Years of Education: 12   Occupational History  . retired    Social History Main Topics  . Smoking status: Former Smoker -- 1.00 packs/day for 35 years    Types: Cigarettes    Quit date: 10/02/2012  . Smokeless tobacco: Never Used  . Alcohol Use: Yes  Comment: rarely  . Drug Use: No  . Sexual Activity: Not Currently    Birth Control/ Protection: None   Other Topics Concern  . Not on file   Social History Narrative     Physical Exam: BP 110/62 mmHg  Pulse 65  Temp(Src) 96.1 F (35.6 C) (Tympanic)  Resp 18  Ht 5\' 1"  (1.549 m)  Wt 57.607 kg (127 lb)  BMI 24.01 kg/m2  SpO2 100% General:   Alert,  pleasant and cooperative in NAD Head:  Normocephalic and atraumatic. Neck:  Supple; no masses or thyromegaly. Lungs:  Clear throughout to auscultation.    Heart:  Regular rate and rhythm. Abdomen:  Soft, nontender and nondistended. Normal bowel sounds, without guarding, and without rebound.   Neurologic:  Alert and  oriented x4;  grossly normal  neurologically.  Impression/Plan: Debbie Dennis is here for an colonoscopy to be performed for screening  Risks, benefits, limitations, and alternatives regarding  colonoscopy have been reviewed with the patient.  Questions have been answered.  All parties agreeable.   Josefine Class, MD  05/14/2015, 8:17 AM

## 2015-05-14 NOTE — Transfer of Care (Signed)
Immediate Anesthesia Transfer of Care Note  Patient: Debbie Dennis  Procedure(s) Performed: Procedure(s): COLONOSCOPY WITH PROPOFOL (N/A)  Patient Location: Endoscopy Unit  Anesthesia Type:General  Level of Consciousness: awake  Airway & Oxygen Therapy: Patient Spontanous Breathing and Patient connected to nasal cannula oxygen  Post-op Assessment: Report given to RN  Post vital signs: Reviewed  Last Vitals:  Filed Vitals:   05/14/15 0849  BP: 96/76  Pulse: 64  Temp: 36.1 C  Resp: 16    Complications: No apparent anesthesia complications

## 2015-05-14 NOTE — Op Note (Signed)
[facility] Gastroenterology Patient Name: Debbie Dennis Procedure Date: 05/14/2015 8:26 AM MRN: [id number] Account #: [id number] Date of Birth: [birth year] Admit Type: Outpatient Age: 69 Room: [facility] ENDO ROOM 2 Gender: Female Note Status: Finalized Procedure:         Colonoscopy Indications:       Screening for colorectal malignant neoplasm, Last                     colonoscopy 10 years ago Patient Profile:   This is a 70 year old female. Providers:         Gerrit Heck. Rayann Heman, MD Referring MD:      Janine Ores. Rosanna Randy, MD (Referring MD) Medicines:         Propofol per Anesthesia Complications:     No immediate complications. Procedure:         Pre-Anesthesia Assessment:                    - Prior to the procedure, a History and Physical was                     performed, and patient medications, allergies and                     sensitivities were reviewed. The patient's tolerance of                     previous anesthesia was reviewed.                    After obtaining informed consent, the colonoscope was                     passed under direct vision. Throughout the procedure, the                     patient's blood pressure, pulse, and oxygen saturations                     were monitored continuously. The Olympus [id number]                     colonoscope (S#. [id number]) was introduced through the anus                     and advanced to the the cecum, identified by appendiceal                     orifice and ileocecal valve. The colonoscopy was performed                     without difficulty. The patient tolerated the procedure                     well. The quality of the bowel preparation was excellent. Findings:      The perianal exam findings include non-thrombosed external hemorrhoids.      A 2 mm polyp was found in the recto-sigmoid colon. The polyp was       sessile. The polyp was removed with a jumbo cold forceps. Resection and       retrieval were  complete.      Internal hemorrhoids were found during retroflexion. The hemorrhoids       were Grade I (internal hemorrhoids that do not prolapse).  The exam was otherwise without abnormality. Impression:        - Non-thrombosed external hemorrhoids found on perianal                     exam.                    - One 2 mm polyp at the recto-sigmoid colon. Resected and                     retrieved.                    - Internal hemorrhoids.                    - The examination was otherwise normal. Recommendation:    - Observe patient in GI recovery unit.                    - High fiber diet.                    - Continue present medications.                    - Await pathology results.                    - Repeat colonoscopy for surveillance in 5 years if path                     is adenomatous, otherwise repeat for screening in 10 years.                    - Return to referring physician.                    - The findings and recommendations were discussed with the                     patient.                    - The findings and recommendations were discussed with the                     patient's family.                    - - Repeat colonoscopy for surveillance in 5 years if path                     is adenomatous, otherwise repeat for screening in 10 years. Procedure Code(s): --- Professional ---                    [id number], Colonoscopy, flexible; with biopsy, single or                     multiple CPT copyright 2014 American Medical Association. All rights reserved. The codes documented in this report are preliminary and upon coder review may  be revised to meet current compliance requirements. Mellody Life, MD 05/14/2015 8:48:14 AM This report has been signed electronically. Number of Addenda: 0 Note Initiated On: 05/14/2015 8:26 AM Scope Withdrawal Time: 0 hours 9 minutes 36 seconds  Total Procedure Duration: 0 hours 15 minutes 6 seconds       [facility]

## 2015-05-16 NOTE — Anesthesia Postprocedure Evaluation (Signed)
  Anesthesia Post-op Note  Patient: Debbie Dennis  Procedure(s) Performed: Procedure(s): COLONOSCOPY WITH PROPOFOL (N/A)  Anesthesia type:General  Patient location: PACU  Post pain: Pain level controlled  Post assessment: Post-op Vital signs reviewed, Patient's Cardiovascular Status Stable, Respiratory Function Stable, Patent Airway and No signs of Nausea or vomiting  Post vital signs: Reviewed and stable  Last Vitals:  Filed Vitals:   05/14/15 0920  BP: 111/64  Pulse: 59  Temp:   Resp: 19    Level of consciousness: awake, alert  and patient cooperative  Complications: No apparent anesthesia complications

## 2015-05-17 LAB — SURGICAL PATHOLOGY

## 2015-05-20 ENCOUNTER — Other Ambulatory Visit: Payer: Self-pay | Admitting: Family Medicine

## 2015-05-29 ENCOUNTER — Other Ambulatory Visit: Payer: Self-pay | Admitting: Family Medicine

## 2015-06-01 ENCOUNTER — Ambulatory Visit
Admission: RE | Admit: 2015-06-01 | Discharge: 2015-06-01 | Disposition: A | Discharge status: Home / Self Care | Payer: Medicare Other | Source: Ambulatory Visit | Attending: Family Medicine | Admitting: Family Medicine

## 2015-06-01 ENCOUNTER — Ambulatory Visit (INDEPENDENT_AMBULATORY_CARE_PROVIDER_SITE_OTHER): Payer: Medicare Other | Admitting: Family Medicine

## 2015-06-01 ENCOUNTER — Encounter: Payer: Self-pay | Admitting: Family Medicine

## 2015-06-01 VITALS — BP 108/64 | HR 72 | Temp 97.7°F | Resp 16 | Wt 128.8 lb

## 2015-06-01 DIAGNOSIS — J40 Bronchitis, not specified as acute or chronic: Secondary | ICD-10-CM | POA: Diagnosis not present

## 2015-06-01 DIAGNOSIS — R490 Dysphonia: Secondary | ICD-10-CM | POA: Diagnosis not present

## 2015-06-01 DIAGNOSIS — B373 Candidiasis of vulva and vagina: Secondary | ICD-10-CM

## 2015-06-01 DIAGNOSIS — B3731 Acute candidiasis of vulva and vagina: Secondary | ICD-10-CM

## 2015-06-01 DIAGNOSIS — R05 Cough: Secondary | ICD-10-CM

## 2015-06-01 DIAGNOSIS — R059 Cough, unspecified: Secondary | ICD-10-CM

## 2015-06-01 DIAGNOSIS — K219 Gastro-esophageal reflux disease without esophagitis: Secondary | ICD-10-CM

## 2015-06-01 DIAGNOSIS — J449 Chronic obstructive pulmonary disease, unspecified: Secondary | ICD-10-CM | POA: Insufficient documentation

## 2015-06-01 MED ORDER — DOXYCYCLINE HYCLATE 100 MG PO TABS: 100.0000 mg | ORAL_TABLET | Freq: Two times a day (BID) | ORAL | Status: DC

## 2015-06-01 NOTE — Progress Notes (Signed)
Patient ID: Debbie Dennis, female   DOB: 11-Dec-1945, 69 y.o.   MRN: [id number]       Patient: Debbie Dennis Female    DOB: [birth year]   69 y.o.   MRN: [id number] Visit Date: 06/01/2015  Today's Provider: Wilhemena Durie, MD   Chief Complaint  Patient presents with  . Gastrophageal Reflux    follow-up, ov 05/11/2015, started on omeprazole  . Vaginitis    follow-up, ov on 05/11/2015, started on fluconazole, completed   Subjective:    Gastrophageal Reflux She complains of a hoarse voice. She reports no abdominal pain, no chest pain, no coughing, no heartburn or no sore throat. The current episode started 1 to 4 weeks ago. The problem has been unchanged.       Allergies  Allergen Reactions  . Codeine Nausea And Vomiting  . Sulfa Antibiotics Rash   Previous Medications   ALPRAZOLAM (XANAX) 0.5 MG TABLET    Take by mouth.   BUTALBITAL-APAP-CAFFEINE 50-325-40 MG PER CAPSULE    Take by mouth.   CHOLECALCIFEROL (VITAMIN D) 1000 UNITS TABLET    Take 1,000 Units by mouth daily.   FLUCONAZOLE (DIFLUCAN) 150 MG TABLET    Take 1 tablet (150 mg total) by mouth daily. For 2 days   GLUCOSE BLOOD (ONE TOUCH ULTRA TEST) TEST STRIP    Patient to test six (6) times daily   INSULIN GLARGINE (LANTUS) 100 UNIT/ML INJECTION    Inject into the skin. 8 units at night   INSULIN LISPRO (HUMALOG) 100 UNIT/ML INJECTION    Inject into the skin. Sliding scale   LEVOTHYROXINE (SYNTHROID, LEVOTHROID) 50 MCG TABLET    TAKE ONE TABLET BY MOUTH ONCE DAILY   LORATADINE (CLARITIN) 10 MG TABLET    Take by mouth.   MECLIZINE (ANTIVERT) 25 MG TABLET    Take by mouth.   MELOXICAM (MOBIC) 7.5 MG TABLET    Take by mouth.   OMEPRAZOLE (PRILOSEC) 20 MG CAPSULE    Take 1 capsule (20 mg total) by mouth daily.   RANITIDINE (ZANTAC) 150 MG TABLET    TAKE ONE TABLET BY MOUTH TWICE DAILY   RANITIDINE (ZANTAC) 75 MG TABLET    Take by mouth.   TOPIRAMATE (TOPAMAX) 50 MG TABLET    Take by mouth 2 (two) times daily.     Review of  Systems  Constitutional: Negative.   HENT: Positive for hoarse voice. Negative for sore throat.   Eyes: Negative.   Respiratory: Negative for cough.   Cardiovascular: Negative for chest pain.  Gastrointestinal: Negative for heartburn and abdominal pain.  Endocrine: Negative.   Psychiatric/Behavioral: Negative.     Social History  Substance Use Topics  . Smoking status: Former Smoker -- 1.00 packs/day for 35 years    Types: Cigarettes    Quit date: 10/02/2012  . Smokeless tobacco: Never Used  . Alcohol Use: Yes     Comment: rarely   Objective:   BP 108/64 mmHg  Pulse 72  Temp(Src) 97.7 F (36.5 C) (Oral)  Resp 16  Wt 128 lb 12.8 oz (58.423 kg)  Physical Exam  Constitutional: She is oriented to person, place, and time. She appears well-developed and well-nourished.  HENT:  Head: Normocephalic and atraumatic.  Right Ear: External ear normal.  Left Ear: External ear normal.  Nose: Nose normal.  Eyes: Conjunctivae are normal.  Neck: Neck supple.  Cardiovascular: Normal rate, regular rhythm and normal heart sounds.   Pulmonary/Chest: Effort normal and breath sounds normal.  Abdominal: Soft.  Neurological: She is alert and oriented to person, place, and time.  Skin: Skin is warm and dry.  Psychiatric: She has a normal mood and affect. Her behavior is normal. Judgment and thought content normal.        Assessment & Plan:     Cough, treated as presumptive bronchitis.    treated with doxycycline twice a day for one week Hoarseness in a smoker Refer to ENT if this cough does not resolve with the doxycycline. Type 1 diabetes Wilhemena Durie, MD  [facility]

## 2015-06-05 DIAGNOSIS — E162 Hypoglycemia, unspecified: Secondary | ICD-10-CM | POA: Diagnosis not present

## 2015-06-10 DIAGNOSIS — H15101 Unspecified episcleritis, right eye: Secondary | ICD-10-CM | POA: Diagnosis not present

## 2015-06-11 ENCOUNTER — Telehealth: Payer: Self-pay | Admitting: Family Medicine

## 2015-06-11 DIAGNOSIS — J301 Allergic rhinitis due to pollen: Secondary | ICD-10-CM | POA: Diagnosis not present

## 2015-06-11 DIAGNOSIS — Z87891 Personal history of nicotine dependence: Secondary | ICD-10-CM | POA: Diagnosis not present

## 2015-06-11 DIAGNOSIS — J387 Other diseases of larynx: Secondary | ICD-10-CM | POA: Diagnosis not present

## 2015-06-11 DIAGNOSIS — K219 Gastro-esophageal reflux disease without esophagitis: Secondary | ICD-10-CM | POA: Diagnosis not present

## 2015-06-11 DIAGNOSIS — J370 Chronic laryngitis: Secondary | ICD-10-CM | POA: Diagnosis not present

## 2015-06-11 NOTE — Telephone Encounter (Signed)
Pt's husband called wanting to get a (pin) for when  Or if his wife passes out.  He uses [address]  Call back [phone]  Thanks, Con Memos

## 2015-06-14 NOTE — Telephone Encounter (Signed)
Spoke with husband he states he wants a pen for injection for his wife in case she passes out due to her sugars. He states there is a injection for diabetics like that. Please review.-aa

## 2015-06-14 NOTE — Telephone Encounter (Signed)
Referral to new endocrinology will be better. I have set the patient many times before her sugar control is too tight. She needs to see an endocrinologist for her

## 2015-06-16 NOTE — Telephone Encounter (Signed)
Husband advised and he will let patient know and let her make that decision he said-aa

## 2015-06-19 ENCOUNTER — Other Ambulatory Visit: Payer: Self-pay | Admitting: Family Medicine

## 2015-06-19 DIAGNOSIS — J309 Allergic rhinitis, unspecified: Secondary | ICD-10-CM

## 2015-06-19 DIAGNOSIS — F419 Anxiety disorder, unspecified: Secondary | ICD-10-CM

## 2015-06-21 NOTE — Telephone Encounter (Signed)
Last ov was on 06/01/2015 and next ov appointment is on 07/22/2015.  Thanks,

## 2015-06-25 DIAGNOSIS — E161 Other hypoglycemia: Secondary | ICD-10-CM | POA: Diagnosis not present

## 2015-07-20 ENCOUNTER — Other Ambulatory Visit: Payer: Self-pay | Admitting: Family Medicine

## 2015-07-20 DIAGNOSIS — E109 Type 1 diabetes mellitus without complications: Secondary | ICD-10-CM

## 2015-07-22 ENCOUNTER — Encounter: Payer: Self-pay | Admitting: Family Medicine

## 2015-07-22 ENCOUNTER — Ambulatory Visit (INDEPENDENT_AMBULATORY_CARE_PROVIDER_SITE_OTHER): Payer: Medicare Other | Admitting: Family Medicine

## 2015-07-22 VITALS — BP 118/60 | HR 84 | Temp 97.6°F | Resp 16 | Wt 129.0 lb

## 2015-07-22 DIAGNOSIS — Z23 Encounter for immunization: Secondary | ICD-10-CM | POA: Diagnosis not present

## 2015-07-22 DIAGNOSIS — E109 Type 1 diabetes mellitus without complications: Secondary | ICD-10-CM | POA: Diagnosis not present

## 2015-07-22 DIAGNOSIS — E038 Other specified hypothyroidism: Secondary | ICD-10-CM | POA: Diagnosis not present

## 2015-07-22 LAB — POCT GLYCOSYLATED HEMOGLOBIN (HGB A1C): HEMOGLOBIN A1C: 6

## 2015-07-22 NOTE — Progress Notes (Signed)
Patient ID: Debbie Dennis, female   DOB: 11/25/1945, 69 y.o.   MRN: [id number]    Subjective:  HPI  Diabetes Mellitus Type II, Follow-up:   Lab Results  Component Value Date   HGBA1C 6.2* 03/23/2015   HGBA1C 5.9 11/19/2014    Last seen for diabetes 4 months ago.  Management since then includes none. She reports good compliance with treatment. She is not having side effects.  Current symptoms include none. Home blood sugar records: pt reports that her blood sugar has been up and down and does recall numbers  Episodes of hypoglycemia? yes - She feels like she has had more low blood sugars since she started Levothyroxine.   Current Insulin Regimen: lantus 8 units and Humalog- sliding scale Most Recent Eye Exam: more than a year ago Current exercise: walking  Pertinent Labs:    Component Value Date/Time   CHOL 174 12/29/2014   TRIG 39* 12/29/2014   CREATININE 1.1 12/29/2014    Wt Readings from Last 3 Encounters:  07/22/15 129 lb (58.514 kg)  06/01/15 128 lb 12.8 oz (58.423 kg)  05/14/15 127 lb (57.607 kg)    ------------------------------------------------------------------------     Prior to Admission medications   Medication Sig Start Date End Date Taking? Authorizing Provider  ALPRAZolam Duanne Moron) 0.5 MG tablet TAKE ONE TO TWO TABLETS BY MOUTH AT BEDTIME 06/21/15  Yes Jerrol Banana., MD  Butalbital-APAP-Caffeine [phone] MG per capsule Take by mouth. 04/23/13  Yes Historical Provider, MD  cholecalciferol (VITAMIN D) 1000 UNITS tablet Take 1,000 Units by mouth daily.   Yes Historical Provider, MD  glucose blood (ONE TOUCH ULTRA TEST) test strip Patient to test six (6) times daily 04/19/15  Yes Richard Maceo Pro., MD  insulin glargine (LANTUS) 100 UNIT/ML injection Inject into the skin. 8 units at night 09/29/13  Yes Historical Provider, MD  insulin lispro (HUMALOG) 100 UNIT/ML injection Inject into the skin. Sliding scale 07/16/14  Yes Historical Provider, MD    levothyroxine (SYNTHROID, LEVOTHROID) 50 MCG tablet TAKE ONE TABLET BY MOUTH ONCE DAILY 05/31/15  Yes Jerrol Banana., MD  loratadine (CLARITIN) 10 MG tablet TAKE ONE TABLET BY MOUTH ONCE DAILY 06/21/15  Yes Jerrol Banana., MD  meclizine (ANTIVERT) 25 MG tablet Take by mouth. 04/24/11  Yes Historical Provider, MD  meloxicam (MOBIC) 7.5 MG tablet Take by mouth. 08/31/14  Yes Historical Provider, MD  omeprazole (PRILOSEC) 20 MG capsule Take 1 capsule (20 mg total) by mouth daily. 05/11/15  Yes Richard Maceo Pro., MD  ranitidine (ZANTAC) 75 MG tablet Take by mouth. 02/02/14  Yes Historical Provider, MD  topiramate (TOPAMAX) 50 MG tablet Take by mouth 2 (two) times daily.  02/02/14  Yes Historical Provider, MD    Patient Active Problem List   Diagnosis Date Noted  . Anxiety 02/05/2015  . Allergic rhinitis 02/05/2015  . Arthritis 02/05/2015  . Acute exacerbation of chronic obstructive airways disease ([place]) 02/05/2015  . 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 Insulin dependent type 1 diabetes mellitus ([place]) 02/05/2015  . Bloodgood disease 02/05/2015  . Acid reflux 02/05/2015  . HLD (hyperlipidemia) 02/05/2015  . BP (high blood pressure) 02/05/2015  . Adult hypothyroidism 02/05/2015  . Adaptive colitis 02/05/2015  . Osteopenia 02/05/2015  . Restless leg 02/05/2015  . History of tobacco abuse 02/05/2015  . Venous stasis syndrome 02/05/2015    Past Medical History  Diagnosis Date  . Anxiety   . Arthritis   . Buedinger-Ludloff-Laewen disease   .  GERD (gastroesophageal reflux disease)   . Diabetes mellitus without complication ([place])   . Hypertension   . Hypothyroidism   . Restless leg syndrome   . Adaptive colitis     Social History   Social History  . Marital Status: Married    Spouse Name: Sonia Side  . Number of Children: 1  . Years of Education: 12   Occupational History  . retired    Social History Main Topics  . Smoking status: Former Smoker -- 1.00  packs/day for 35 years    Types: Cigarettes    Quit date: 10/02/2012  . Smokeless tobacco: Never Used  . Alcohol Use: Yes     Comment: rarely  . Drug Use: No  . Sexual Activity: Not Currently    Birth Control/ Protection: None   Other Topics Concern  . Not on file   Social History Narrative    Allergies  Allergen Reactions  . Codeine Nausea And Vomiting  . Sulfa Antibiotics Rash    Review of Systems  Constitutional: Negative.   HENT: Negative.   Eyes: Negative.   Respiratory: Negative.   Cardiovascular: Negative.   Gastrointestinal: Negative.   Genitourinary: Negative.   Musculoskeletal: Negative.   Skin: Negative.   Neurological: Negative.   Endo/Heme/Allergies: Negative.   Psychiatric/Behavioral: Negative.     Immunization History  Administered Date(s) Administered  . Pneumococcal Conjugate-13 08/31/2014  . Pneumococcal Polysaccharide-23 07/11/2007, 01/16/2013  . Tdap 08/25/2008  . Zoster 03/02/2012   Objective:  BP 118/60 mmHg  Pulse 84  Temp(Src) 97.6 F (36.4 C) (Oral)  Resp 16  Wt 129 lb (58.514 kg)  Physical Exam  Constitutional: She is oriented to person, place, and time and well-developed, well-nourished, and in no distress.  HENT:  Head: Normocephalic and atraumatic.  Right Ear: External ear normal.  Left Ear: External ear normal.  Nose: Nose normal.  Eyes: Conjunctivae are normal.  Neck: Neck supple.  Cardiovascular: Normal rate, regular rhythm and normal heart sounds.   Pulmonary/Chest: Effort normal and breath sounds normal.  Abdominal: Soft.  Neurological: She is alert and oriented to person, place, and time.  Skin: Skin is warm and dry.  Psychiatric: Mood, memory, affect and judgment normal.    Lab Results  Component Value Date   WBC 5.8 12/29/2014   HGB 13.0 12/29/2014   HCT 40 12/29/2014   PLT 325 12/29/2014   CHOL 174 12/29/2014   TRIG 39* 12/29/2014   HDL 71* 12/29/2014   LDLCALC 95 12/29/2014   TSH 1.230 03/23/2015    HGBA1C 6.2* 03/23/2015    CMP     Component Value Date/Time   NA 144 12/29/2014   K 5.0 12/29/2014   BUN 23* 12/29/2014   CREATININE 1.1 12/29/2014   AST 12* 12/29/2014   ALT 6* 12/29/2014   ALKPHOS 81 12/29/2014    Assessment and Plan :  1. Insulin dependent type 1 diabetes mellitus ([place]) Told pt I strongly desire her to see endocrinology. She refuses. Will have to allow BS to be higher as hypoglycemia is a regular issue which is worsening as she ages. Again stressed the need to see endocrinology. - POCT HgB A1C--6.0 TIDM for 28 years. 2. Other specified hypothyroidism   3. Need for influenza vaccination  - Flu vaccine HIGH DOSE PF  I have done the exam and reviewed the above chart and it is accurate to the best of my knowledge.  Miguel Aschoff MD [facility] 07/22/2015 3:58 PM

## 2015-07-26 ENCOUNTER — Other Ambulatory Visit: Payer: Self-pay | Admitting: Family Medicine

## 2015-08-24 ENCOUNTER — Other Ambulatory Visit: Payer: Self-pay | Admitting: Family Medicine

## 2015-08-31 ENCOUNTER — Other Ambulatory Visit: Payer: Self-pay | Admitting: Family Medicine

## 2015-08-31 DIAGNOSIS — Z1231 Encounter for screening mammogram for malignant neoplasm of breast: Secondary | ICD-10-CM

## 2015-09-02 ENCOUNTER — Ambulatory Visit
Admission: RE | Admit: 2015-09-02 | Discharge: 2015-09-02 | Disposition: A | Discharge status: Home / Self Care | Payer: Medicare Other | Source: Ambulatory Visit | Attending: Family Medicine | Admitting: Family Medicine

## 2015-09-02 ENCOUNTER — Other Ambulatory Visit: Payer: Self-pay | Admitting: Family Medicine

## 2015-09-02 DIAGNOSIS — Z1231 Encounter for screening mammogram for malignant neoplasm of breast: Secondary | ICD-10-CM | POA: Insufficient documentation

## 2015-10-21 DIAGNOSIS — E119 Type 2 diabetes mellitus without complications: Secondary | ICD-10-CM | POA: Diagnosis not present

## 2015-10-29 ENCOUNTER — Encounter: Payer: Self-pay | Admitting: Family Medicine

## 2015-11-09 ENCOUNTER — Telehealth: Payer: Self-pay | Admitting: Family Medicine

## 2015-11-09 ENCOUNTER — Other Ambulatory Visit: Payer: Self-pay

## 2015-11-09 MED ORDER — INSULIN ASPART 100 UNIT/ML ~~LOC~~ SOLN: SUBCUTANEOUS | Status: DC

## 2015-11-09 NOTE — Telephone Encounter (Signed)
Ok to [employer] dosing.

## 2015-11-09 NOTE — Telephone Encounter (Signed)
Pt stated her insurance will no longer cover her insulin lispro (HUMALOG) 100 UNIT/ML injection she would like it changed to Novalog and send it to [place]. Pt would like it done today if possible. Thanks TNP

## 2015-11-09 NOTE — Telephone Encounter (Signed)
Ok to send? Please advise. Thanks!

## 2015-11-09 NOTE — Telephone Encounter (Signed)
Pt advised and RX sent in and updated in the chart-aa

## 2015-11-22 ENCOUNTER — Ambulatory Visit (INDEPENDENT_AMBULATORY_CARE_PROVIDER_SITE_OTHER): Payer: Medicare Other | Admitting: Family Medicine

## 2015-11-22 VITALS — BP 130/68 | HR 76 | Temp 98.1°F | Resp 16 | Wt 127.0 lb

## 2015-11-22 DIAGNOSIS — E109 Type 1 diabetes mellitus without complications: Secondary | ICD-10-CM | POA: Diagnosis not present

## 2015-11-22 DIAGNOSIS — E785 Hyperlipidemia, unspecified: Secondary | ICD-10-CM

## 2015-11-22 DIAGNOSIS — I10 Essential (primary) hypertension: Secondary | ICD-10-CM

## 2015-11-22 NOTE — Progress Notes (Signed)
Patient ID: Debbie Dennis, female   DOB: 06-28-1946, 70 y.o.   MRN: [id number]   Debbie Dennis  MRN: [id number] DOB: March 17, 1946  Subjective:  HPI   1. Essential hypertension The patient is a 70 year old female who presents for follow up of her hypertension.  She was last seen on 07/22/15.  Her blood pressure was 118/60 at that time and no management changes were made.    2. Insulin dependent type 1 diabetes mellitus ([place]) The patient is also here to follow up on her Type I diabetes.  She states her glucose when checked ranges from in the 50"s-200.  She could not say how often she had low glucose levels, possibly weekly.   Patient Active Problem List   Diagnosis Date Noted  . Anxiety 02/05/2015  . Allergic rhinitis 02/05/2015  . Arthritis 02/05/2015  . Acute exacerbation of chronic obstructive airways disease ([place]) 02/05/2015  . 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 Insulin dependent type 1 diabetes mellitus ([place]) 02/05/2015  . Bloodgood disease 02/05/2015  . Acid reflux 02/05/2015  . HLD (hyperlipidemia) 02/05/2015  . BP (high blood pressure) 02/05/2015  . Adult hypothyroidism 02/05/2015  . Adaptive colitis 02/05/2015  . Osteopenia 02/05/2015  . Restless leg 02/05/2015  . History of tobacco abuse 02/05/2015  . Venous stasis syndrome 02/05/2015    Past Medical History  Diagnosis Date  . Anxiety   . Arthritis   . Buedinger-Ludloff-Laewen disease   . GERD (gastroesophageal reflux disease)   . Diabetes mellitus without complication ([place])   . Hypertension   . Hypothyroidism   . Restless leg syndrome   . Adaptive colitis     Social History   Social History  . Marital Status: Married    Spouse Name: Sonia Side  . Number of Children: 1  . Years of Education: 12   Occupational History  . retired    Social History Main Topics  . Smoking status: Former Smoker -- 1.00 packs/day for 35 years    Types: Cigarettes    Quit date: 10/02/2012  . Smokeless tobacco: Never Used   . Alcohol Use: Yes     Comment: rarely  . Drug Use: No  . Sexual Activity: Not Currently    Birth Control/ Protection: None   Other Topics Concern  . Not on file   Social History Narrative    Outpatient Prescriptions Prior to Visit  Medication Sig Dispense Refill  . ALPRAZolam (XANAX) 0.5 MG tablet TAKE ONE TO TWO TABLETS BY MOUTH AT BEDTIME 60 tablet 5  . Butalbital-APAP-Caffeine 50-325-40 MG per capsule Take by mouth.    Marland Kitchen glucose blood (ONE TOUCH ULTRA TEST) test strip Patient to test six (6) times daily 200 each 12  . insulin aspart (NOVOLOG) 100 UNIT/ML injection Sliding scale- up to 60 units daily DX: E11.9 10 mL 11  . LANTUS 100 UNIT/ML injection INJECT 10 UNITS SUBCUTANEOUSLY AT BEDTIME 10 mL 12  . levothyroxine (SYNTHROID, LEVOTHROID) 50 MCG tablet TAKE ONE TABLET BY MOUTH ONCE DAILY 30 tablet 12  . loratadine (CLARITIN) 10 MG tablet TAKE ONE TABLET BY MOUTH ONCE DAILY 30 tablet 12  . meclizine (ANTIVERT) 25 MG tablet Take by mouth.    . meloxicam (MOBIC) 7.5 MG tablet Take by mouth.    Marland Kitchen omeprazole (PRILOSEC) 20 MG capsule Take 1 capsule (20 mg total) by mouth daily. 30 capsule 12  . ranitidine (ZANTAC) 150 MG tablet TAKE ONE TABLET BY MOUTH TWICE DAILY 60 tablet 12  . ranitidine (  ZANTAC) 75 MG tablet Take by mouth.    . topiramate (TOPAMAX) 50 MG tablet Take by mouth 2 (two) times daily.     . cholecalciferol (VITAMIN D) 1000 UNITS tablet Take 1,000 Units by mouth daily. Reported on 11/22/2015     No facility-administered medications prior to visit.    Allergies  Allergen Reactions  . Codeine Nausea And Vomiting  . Sulfa Antibiotics Rash    Review of Systems  Constitutional: Positive for malaise/fatigue. Negative for fever, chills and diaphoresis.  Respiratory: Negative for cough, hemoptysis, sputum production, shortness of breath and wheezing.   Cardiovascular: Positive for leg swelling. Negative for chest pain, palpitations and orthopnea.  Neurological:  Negative for weakness.  Endo/Heme/Allergies: Negative for polydipsia.   Objective:  BP 130/68 mmHg  Pulse 76  Temp(Src) 98.1 F (36.7 C) (Oral)  Resp 16  Wt 127 lb (57.607 kg)  Physical Exam  Assessment and Plan :  Essential hypertension  Insulin dependent type 1 diabetes mellitus (HCC) Labile, brittle type 1 diabetes area and I stressed again the importance of endocrinology referral and patient refuses. She checks her blood sugar about 12 times a day because she is so brittle. I again stressed to her up and rather her sugars run a little bit higher at age 48 then having very tight control. Again, stressed importance or referral to endocrinology. Patient refuses. More than 50% of this time is spent in counseling. Hypothyroid Hyperlipidemia Consider adding statin therapy even though LDL was below 100. GERD I have done the exam and reviewed the above chart and it is accurate to the best of my knowledge.  Miguel Aschoff MD [facility] 11/22/2015 3:43 PM

## 2015-11-23 DIAGNOSIS — E161 Other hypoglycemia: Secondary | ICD-10-CM | POA: Diagnosis not present

## 2015-11-26 DIAGNOSIS — H2513 Age-related nuclear cataract, bilateral: Secondary | ICD-10-CM | POA: Diagnosis not present

## 2015-11-26 DIAGNOSIS — E109 Type 1 diabetes mellitus without complications: Secondary | ICD-10-CM | POA: Diagnosis not present

## 2015-11-26 DIAGNOSIS — E785 Hyperlipidemia, unspecified: Secondary | ICD-10-CM | POA: Diagnosis not present

## 2015-11-26 DIAGNOSIS — I10 Essential (primary) hypertension: Secondary | ICD-10-CM | POA: Diagnosis not present

## 2015-11-27 LAB — CBC WITH DIFFERENTIAL/PLATELET
BASOS ABS: 0 10*3/uL (ref 0.0–0.2)
BASOS: 1 %
EOS (ABSOLUTE): 0.1 10*3/uL (ref 0.0–0.4)
Eos: 2 %
HEMOGLOBIN: 13.3 g/dL (ref 11.1–15.9)
Hematocrit: 40.9 % (ref 34.0–46.6)
IMMATURE GRANS (ABS): 0 10*3/uL (ref 0.0–0.1)
Immature Granulocytes: 0 %
LYMPHS ABS: 2 10*3/uL (ref 0.7–3.1)
Lymphs: 33 %
MCH: 31.9 pg (ref 26.6–33.0)
MCHC: 32.5 g/dL (ref 31.5–35.7)
MCV: 98 fL — AB (ref 79–97)
Monocytes Absolute: 0.5 10*3/uL (ref 0.1–0.9)
Monocytes: 9 %
NEUTROS ABS: 3.2 10*3/uL (ref 1.4–7.0)
Neutrophils: 55 %
PLATELETS: 292 10*3/uL (ref 150–379)
RBC: 4.17 x10E6/uL (ref 3.77–5.28)
RDW: 13.8 % (ref 12.3–15.4)
WBC: 5.9 10*3/uL (ref 3.4–10.8)

## 2015-11-27 LAB — COMPREHENSIVE METABOLIC PANEL
A/G RATIO: 2.4 (ref 1.1–2.5)
ALBUMIN: 4 g/dL (ref 3.6–4.8)
ALK PHOS: 87 IU/L (ref 39–117)
ALT: 9 IU/L (ref 0–32)
AST: 16 IU/L (ref 0–40)
BILIRUBIN TOTAL: 0.4 mg/dL (ref 0.0–1.2)
BUN / CREAT RATIO: 12 (ref 11–26)
BUN: 13 mg/dL (ref 8–27)
CHLORIDE: 104 mmol/L (ref 96–106)
CO2: 21 mmol/L (ref 18–29)
Calcium: 9.2 mg/dL (ref 8.7–10.3)
Creatinine, Ser: 1.13 mg/dL — ABNORMAL HIGH (ref 0.57–1.00)
GFR calc non Af Amer: 50 mL/min/{1.73_m2} — ABNORMAL LOW (ref >59)
GFR, EST AFRICAN AMERICAN: 58 mL/min/{1.73_m2} — AB (ref >59)
GLUCOSE: 227 mg/dL — AB (ref 65–99)
Globulin, Total: 1.7 g/dL (ref 1.5–4.5)
POTASSIUM: 5 mmol/L (ref 3.5–5.2)
Sodium: 141 mmol/L (ref 134–144)
TOTAL PROTEIN: 5.7 g/dL — AB (ref 6.0–8.5)

## 2015-11-27 LAB — LIPID PANEL WITH LDL/HDL RATIO
CHOLESTEROL TOTAL: 163 mg/dL (ref 100–199)
HDL: 57 mg/dL (ref >39)
LDL CALC: 93 mg/dL (ref 0–99)
LDl/HDL Ratio: 1.6 ratio units (ref 0.0–3.2)
TRIGLYCERIDES: 63 mg/dL (ref 0–149)
VLDL Cholesterol Cal: 13 mg/dL (ref 5–40)

## 2015-11-27 LAB — HEMOGLOBIN A1C
ESTIMATED AVERAGE GLUCOSE: 126 mg/dL
HEMOGLOBIN A1C: 6 % — AB (ref 4.8–5.6)

## 2015-12-10 ENCOUNTER — Other Ambulatory Visit: Payer: Self-pay

## 2015-12-10 MED ORDER — GLUCOSE BLOOD VI STRP: ORAL_STRIP | Status: DC

## 2015-12-24 ENCOUNTER — Other Ambulatory Visit: Payer: Self-pay | Admitting: Family Medicine

## 2015-12-31 DIAGNOSIS — H2513 Age-related nuclear cataract, bilateral: Secondary | ICD-10-CM | POA: Diagnosis not present

## 2016-01-11 ENCOUNTER — Encounter: Payer: Self-pay | Admitting: *Deleted

## 2016-01-20 ENCOUNTER — Encounter
Admission: RE | Disposition: A | Discharge status: Home / Self Care | Payer: Self-pay | Source: Ambulatory Visit | Attending: Ophthalmology

## 2016-01-20 ENCOUNTER — Ambulatory Visit: Payer: Medicare Other | Admitting: Anesthesiology

## 2016-01-20 ENCOUNTER — Ambulatory Visit
Admission: RE | Admit: 2016-01-20 | Discharge: 2016-01-20 | Disposition: A | Discharge status: Home / Self Care | Payer: Medicare Other | Source: Ambulatory Visit | Attending: Ophthalmology | Admitting: Ophthalmology

## 2016-01-20 DIAGNOSIS — K219 Gastro-esophageal reflux disease without esophagitis: Secondary | ICD-10-CM | POA: Diagnosis not present

## 2016-01-20 DIAGNOSIS — F419 Anxiety disorder, unspecified: Secondary | ICD-10-CM | POA: Diagnosis not present

## 2016-01-20 DIAGNOSIS — Z90710 Acquired absence of both cervix and uterus: Secondary | ICD-10-CM | POA: Insufficient documentation

## 2016-01-20 DIAGNOSIS — Z87891 Personal history of nicotine dependence: Secondary | ICD-10-CM | POA: Diagnosis not present

## 2016-01-20 DIAGNOSIS — M4692 Unspecified inflammatory spondylopathy, cervical region: Secondary | ICD-10-CM | POA: Diagnosis not present

## 2016-01-20 DIAGNOSIS — Z885 Allergy status to narcotic agent status: Secondary | ICD-10-CM | POA: Diagnosis not present

## 2016-01-20 DIAGNOSIS — Z882 Allergy status to sulfonamides status: Secondary | ICD-10-CM | POA: Insufficient documentation

## 2016-01-20 DIAGNOSIS — I10 Essential (primary) hypertension: Secondary | ICD-10-CM | POA: Insufficient documentation

## 2016-01-20 DIAGNOSIS — H2512 Age-related nuclear cataract, left eye: Secondary | ICD-10-CM | POA: Insufficient documentation

## 2016-01-20 DIAGNOSIS — E079 Disorder of thyroid, unspecified: Secondary | ICD-10-CM | POA: Diagnosis not present

## 2016-01-20 DIAGNOSIS — M19011 Primary osteoarthritis, right shoulder: Secondary | ICD-10-CM | POA: Insufficient documentation

## 2016-01-20 DIAGNOSIS — G2581 Restless legs syndrome: Secondary | ICD-10-CM | POA: Diagnosis not present

## 2016-01-20 DIAGNOSIS — M19012 Primary osteoarthritis, left shoulder: Secondary | ICD-10-CM | POA: Diagnosis not present

## 2016-01-20 DIAGNOSIS — G43909 Migraine, unspecified, not intractable, without status migrainosus: Secondary | ICD-10-CM | POA: Insufficient documentation

## 2016-01-20 DIAGNOSIS — J449 Chronic obstructive pulmonary disease, unspecified: Secondary | ICD-10-CM | POA: Insufficient documentation

## 2016-01-20 DIAGNOSIS — E119 Type 2 diabetes mellitus without complications: Secondary | ICD-10-CM | POA: Diagnosis not present

## 2016-01-20 HISTORY — PX: CATARACT EXTRACTION W/PHACO: SHX586

## 2016-01-20 HISTORY — DX: Bronchitis, not specified as acute or chronic: J40

## 2016-01-20 HISTORY — DX: Headache: R51

## 2016-01-20 HISTORY — DX: Headache, unspecified: R51.9

## 2016-01-20 LAB — GLUCOSE, CAPILLARY: Glucose-Capillary: 166 mg/dL — ABNORMAL HIGH (ref 65–99)

## 2016-01-20 SURGERY — PHACOEMULSIFICATION, CATARACT, WITH IOL INSERTION
Anesthesia: Monitor Anesthesia Care | Site: Eye | Laterality: Left | Wound class: Clean

## 2016-01-20 MED ORDER — SODIUM CHLORIDE 0.9 % IV SOLN
INTRAVENOUS | Status: DC
  Administered 2016-01-20: 06:00:00 via INTRAVENOUS

## 2016-01-20 MED ORDER — MIDAZOLAM HCL 2 MG/2ML IJ SOLN
INTRAMUSCULAR | Status: DC | PRN
  Administered 2016-01-20: 1 mg via INTRAVENOUS

## 2016-01-20 MED ORDER — TETRACAINE HCL 0.5 % OP SOLN
1.0000 [drp] | Freq: Once | OPHTHALMIC | Status: AC
  Administered 2016-01-20: 1 [drp] via OPHTHALMIC

## 2016-01-20 MED ORDER — BUPIVACAINE HCL (PF) 0.75 % IJ SOLN
INTRAMUSCULAR | Status: AC
  Filled 2016-01-20: qty 10

## 2016-01-20 MED ORDER — POVIDONE-IODINE 5 % OP SOLN
1.0000 "application " | Freq: Once | OPHTHALMIC | Status: AC
  Administered 2016-01-20: 1 via OPHTHALMIC

## 2016-01-20 MED ORDER — CEFUROXIME OPHTHALMIC INJECTION 1 MG/0.1 ML
INJECTION | OPHTHALMIC | Status: AC
  Filled 2016-01-20: qty 0.1

## 2016-01-20 MED ORDER — MOXIFLOXACIN HCL 0.5 % OP SOLN: 1.0000 [drp] | OPHTHALMIC | Status: DC | PRN

## 2016-01-20 MED ORDER — FENTANYL CITRATE (PF) 100 MCG/2ML IJ SOLN
INTRAMUSCULAR | Status: DC | PRN
  Administered 2016-01-20: 50 ug via INTRAVENOUS

## 2016-01-20 MED ORDER — ONDANSETRON HCL 4 MG/2ML IJ SOLN
INTRAMUSCULAR | Status: DC | PRN
Start: 2016-01-20 — End: 2016-01-20
  Administered 2016-01-20: 4 mg via INTRAVENOUS

## 2016-01-20 MED ORDER — MOXIFLOXACIN HCL 0.5 % OP SOLN
OPHTHALMIC | Status: AC
  Filled 2016-01-20: qty 3

## 2016-01-20 MED ORDER — ARMC OPHTHALMIC DILATING GEL
OPHTHALMIC | Status: AC
  Administered 2016-01-20: 1 via OPHTHALMIC
  Filled 2016-01-20: qty 0.25

## 2016-01-20 MED ORDER — ARMC OPHTHALMIC DILATING GEL
1.0000 "application " | OPHTHALMIC | Status: AC | PRN
  Administered 2016-01-20 (×2): 1 via OPHTHALMIC

## 2016-01-20 MED ORDER — NEOMYCIN-POLYMYXIN-DEXAMETH 3.5-10000-0.1 OP OINT
TOPICAL_OINTMENT | OPHTHALMIC | Status: AC
  Filled 2016-01-20: qty 3.5

## 2016-01-20 MED ORDER — LIDOCAINE HCL (PF) 4 % IJ SOLN
INTRAMUSCULAR | Status: DC | PRN
  Administered 2016-01-20: 4 mL via OPHTHALMIC

## 2016-01-20 MED ORDER — EPINEPHRINE HCL 1 MG/ML IJ SOLN
INTRAOCULAR | Status: DC | PRN
  Administered 2016-01-20: 250 mL via OPHTHALMIC

## 2016-01-20 MED ORDER — LIDOCAINE HCL (PF) 4 % IJ SOLN
INTRAMUSCULAR | Status: AC
  Filled 2016-01-20: qty 5

## 2016-01-20 MED ORDER — NA HYALUR & NA CHOND-NA HYALUR 0.4-0.35 ML IO KIT
PACK | INTRAOCULAR | Status: DC | PRN
  Administered 2016-01-20: .75 mL via INTRAOCULAR

## 2016-01-20 MED ORDER — TETRACAINE HCL 0.5 % OP SOLN
OPHTHALMIC | Status: AC
  Administered 2016-01-20: 1 [drp] via OPHTHALMIC
  Filled 2016-01-20: qty 2

## 2016-01-20 MED ORDER — NEOMYCIN-POLYMYXIN-DEXAMETH 0.1 % OP OINT
TOPICAL_OINTMENT | OPHTHALMIC | Status: DC | PRN
  Administered 2016-01-20: 1 via OPHTHALMIC

## 2016-01-20 MED ORDER — CEFUROXIME OPHTHALMIC INJECTION 1 MG/0.1 ML
INJECTION | OPHTHALMIC | Status: DC | PRN
  Administered 2016-01-20: 0.1 mL via INTRACAMERAL

## 2016-01-20 MED ORDER — POVIDONE-IODINE 5 % OP SOLN
OPHTHALMIC | Status: AC
  Administered 2016-01-20: 1 via OPHTHALMIC
  Filled 2016-01-20: qty 30

## 2016-01-20 MED ORDER — NA HYALUR & NA CHOND-NA HYALUR 0.55-0.5 ML IO KIT
PACK | INTRAOCULAR | Status: AC
  Filled 2016-01-20: qty 1.05

## 2016-01-20 MED ORDER — CARBACHOL 0.01 % IO SOLN
INTRAOCULAR | Status: DC | PRN
  Administered 2016-01-20: 0.5 mL via INTRAOCULAR

## 2016-01-20 MED ORDER — EPINEPHRINE HCL 1 MG/ML IJ SOLN
INTRAMUSCULAR | Status: AC
  Filled 2016-01-20: qty 2

## 2016-01-20 SURGICAL SUPPLY — 21 items
CANNULA ANT/CHMB 27GA (MISCELLANEOUS) ×3 IMPLANT
CUP MEDICINE 2OZ PLAST GRAD ST (MISCELLANEOUS) ×3 IMPLANT
GLOVE BIO SURGEON STRL SZ8 (GLOVE) ×3 IMPLANT
GLOVE BIOGEL M 6.5 STRL (GLOVE) ×3 IMPLANT
GLOVE SURG LX 7.5 STRW (GLOVE) ×2
GLOVE SURG LX STRL 7.5 STRW (GLOVE) ×1 IMPLANT
GOWN STRL REUS W/ TWL LRG LVL3 (GOWN DISPOSABLE) ×2 IMPLANT
GOWN STRL REUS W/TWL LRG LVL3 (GOWN DISPOSABLE) ×4
LENS IOL TECNIS SYMFONY 24.0 (Intraocular Lens) ×3 IMPLANT
PACK CATARACT (MISCELLANEOUS) ×3 IMPLANT
PACK CATARACT BRASINGTON LX (MISCELLANEOUS) ×3 IMPLANT
PACK EYE AFTER SURG (MISCELLANEOUS) ×3 IMPLANT
SOL BSS BAG (MISCELLANEOUS) ×3
SOL PREP PVP 2OZ (MISCELLANEOUS) ×3
SOLUTION BSS BAG (MISCELLANEOUS) ×1 IMPLANT
SOLUTION PREP PVP 2OZ (MISCELLANEOUS) ×1 IMPLANT
SYR 3ML LL SCALE MARK (SYRINGE) ×3 IMPLANT
SYR 5ML LL (SYRINGE) ×3 IMPLANT
SYR TB 1ML 27GX1/2 LL (SYRINGE) ×3 IMPLANT
WATER STERILE IRR 1000ML POUR (IV SOLUTION) ×3 IMPLANT
WIPE NON LINTING 3.25X3.25 (MISCELLANEOUS) ×3 IMPLANT

## 2016-01-20 NOTE — Op Note (Signed)
OPERATIVE NOTE  Sidonia Knake Paolucci [id number] 01/20/2016   PREOPERATIVE DIAGNOSIS:  Nuclear sclerotic cataract left eye. H25.12   POSTOPERATIVE DIAGNOSIS:    Nuclear sclerotic cataract left eye.     PROCEDURE:  Phacoemusification with posterior chamber intraocular lens placement of the left eye   LENS:   Implant Name Type Inv. Item Serial No. Manufacturer Lot No. LRB No. Used  LENS IOL SYMFONY 24.0 - [id number] Intraocular Lens LENS IOL SYMFONY 24.0 [id number] Left 1        ULTRASOUND TIME: 20 % of 2 minutes, 2 seconds.  CDE 24.5   SURGEON:  Wyonia Hough, MD   ANESTHESIA:  Topical with tetracaine drops and 2% Xylocaine jelly, augmented with 1% preservative-free intracameral lidocaine.    COMPLICATIONS:  None.   DESCRIPTION OF PROCEDURE:  The patient was identified in the holding room and transported to the operating room and placed in the supine position under the operating microscope.  The left eye was identified as the operative eye and it was prepped and draped in the usual sterile ophthalmic fashion.   A 1 millimeter clear-corneal paracentesis was made at the 1:30 position. 0.5 ml of preservative-free 1% lidocaine was injected into the anterior chamber.  The anterior chamber was filled with Viscoat viscoelastic.  A 2.4 millimeter keratome was used to make a near-clear corneal incision at the 10:30 position.  .  A curvilinear capsulorrhexis was made with a cystotome and capsulorrhexis forceps.  Balanced salt solution was used to hydrodissect and hydrodelineate the nucleus.   Phacoemulsification was then used in stop and chop fashion to remove the lens nucleus and epinucleus.  The remaining cortex was then removed using the irrigation and aspiration handpiece. Provisc was then placed into the capsular bag to distend it for lens placement.  A lens was then injected into the capsular bag.  The remaining viscoelastic was aspirated.   Wounds were hydrated with  balanced salt solution.  The anterior chamber was inflated to a physiologic pressure with balanced salt solution. Cefuroxime 0.1 ml of a 10mg /ml solution was injected into the anterior chamber for a dose of 1 mg of intracameral antibiotic at the completion of the case.  Miostat was placed into the anterior chamber to constrict the pupil.  No wound leaks were noted.  Topical Vigamox drops and Maxitrol ointment were applied to the eye.  The patient was taken to the recovery room in stable condition without complications of anesthesia or surgery  Takeyah Wieman 01/20/2016, 8:05 AM

## 2016-01-20 NOTE — Anesthesia Postprocedure Evaluation (Signed)
Anesthesia Post Note  Patient: Debbie Dennis  Procedure(s) Performed: Procedure(s) (LRB): CATARACT EXTRACTION PHACO AND INTRAOCULAR LENS PLACEMENT (IOC) (Left)  Patient location during evaluation: Other Anesthesia Type: MAC Level of consciousness: awake and alert Pain management: pain level controlled Vital Signs Assessment: post-procedure vital signs reviewed and stable Respiratory status: spontaneous breathing and respiratory function stable Cardiovascular status: blood pressure returned to baseline and stable Postop Assessment: no headache and no backache Anesthetic complications: no    Last Vitals:  Filed Vitals:   01/20/16 0602 01/20/16 0807  BP: 123/63 131/65  Pulse: 75 70  Temp: 36.6 C 35.9 C  Resp: 14 12    Last Pain: There were no vitals filed for this visit.               Doreen Salvage A

## 2016-01-20 NOTE — Anesthesia Preprocedure Evaluation (Signed)
Anesthesia Evaluation  Patient identified by MRN, date of birth, ID band Patient awake    Reviewed: Allergy & Precautions, NPO status   Airway Mallampati: II       Dental  (+) Upper Dentures, Lower Dentures   Pulmonary COPD, former smoker,    breath sounds clear to auscultation       Cardiovascular hypertension,  Rhythm:Regular Rate:Normal     Neuro/Psych    GI/Hepatic Neg liver ROS, GERD  ,  Endo/Other  diabetes, Well Controlled, Type 1, Insulin DependentHypothyroidism   Renal/GU negative Renal ROS     Musculoskeletal   Abdominal   Peds  Hematology   Anesthesia Other Findings   Reproductive/Obstetrics                             Anesthesia Physical Anesthesia Plan  ASA: III  Anesthesia Plan: MAC   Post-op Pain Management:    Induction: Intravenous  Airway Management Planned: Natural Airway and Nasal Cannula  Additional Equipment:   Intra-op Plan:   Post-operative Plan:   Informed Consent: I have reviewed the patients History and Physical, chart, labs and discussed the procedure including the risks, benefits and alternatives for the proposed anesthesia with the patient or authorized representative who has indicated his/her understanding and acceptance.     Plan Discussed with: CRNA  Anesthesia Plan Comments:         Anesthesia Quick Evaluation

## 2016-01-20 NOTE — H&P (Signed)
  The History and Physical notes are on paper, have been signed, and are to be scanned. The patient remains stable and unchanged from the H&P.   Previous H&P reviewed, patient examined, and there are no changes.  Connelly [place] 01/20/2016 7:34 AM

## 2016-01-20 NOTE — Transfer of Care (Signed)
Immediate Anesthesia Transfer of Care Note  Patient: Liona Nimmer Oshana  Procedure(s) Performed: Procedure(s) with comments: CATARACT EXTRACTION PHACO AND INTRAOCULAR LENS PLACEMENT (IOC) (Left) - Lot # [id number] H US:02:01:5 AP:20.2% CDE: 24.51  Patient Location: PHASE II  Anesthesia Type:MAC  Level of Consciousness: Awake, Alert, Oriented  Airway & Oxygen Therapy: Patient Spontanous Breathing and Patient on room air   Post-op Assessment: Report given to RN and Post -op Vital signs reviewed and stable  Post vital signs: Reviewed and stable  Last Vitals:  Filed Vitals:   01/20/16 0602 01/20/16 0807  BP: 123/63 131/65  Pulse: 75 70  Temp: 36.6 C 35.9 C  Resp: 14 12    Complications: No apparent anesthesia complications

## 2016-01-20 NOTE — OR Nursing (Signed)
Patient reports taking Lantus 4 units last pm and Novolog 7 units at 1730 last pm

## 2016-01-20 NOTE — Discharge Instructions (Signed)
Eye Surgery Discharge Instructions  Expect mild scratchy sensation or mild soreness. DO NOT RUB YOUR EYE!  The day of surgery:  Minimal physical activity, but bed rest is not required  No reading, computer work, or close hand work  No bending, lifting, or straining.  May watch TV  For 24 hours:  No driving, legal decisions, or alcoholic beverages  Safety precautions  Eat anything you prefer: It is better to start with liquids, then soup then solid foods.  _____ Eye patch should be worn until postoperative exam tomorrow.  ____ Solar shield eyeglasses should be worn for comfort in the sunlight/patch while sleeping  Resume all regular medications including aspirin or Coumadin if these were discontinued prior to surgery. You may shower, bathe, shave, or wash your hair. Tylenol may be taken for mild discomfort.  Call your doctor if you experience significant pain, nausea, or vomiting, fever > 101 or other signs of infection. [phone] or [phone] Specific instructions:  Follow-up Information    Follow up with Leandrew Koyanagi, MD. Go today.   Specialty:  Ophthalmology   Why:  at 3:40pm   Contact information:   [address]   [place] [address] [phone]

## 2016-01-20 NOTE — Anesthesia Procedure Notes (Signed)
Procedure Name: MAC Date/Time: 01/20/2016 7:41 AM Performed by: Doreen Salvage Pre-anesthesia Checklist: Patient identified, Emergency Drugs available, Suction available and Patient being monitored Patient Re-evaluated:Patient Re-evaluated prior to inductionOxygen Delivery Method: Nasal cannula

## 2016-01-21 ENCOUNTER — Encounter: Payer: Self-pay | Admitting: Ophthalmology

## 2016-03-23 ENCOUNTER — Ambulatory Visit (INDEPENDENT_AMBULATORY_CARE_PROVIDER_SITE_OTHER): Payer: Medicare Other | Admitting: Family Medicine

## 2016-03-23 VITALS — BP 118/68 | HR 68 | Temp 97.5°F | Resp 16 | Ht 61.0 in | Wt 128.0 lb

## 2016-03-23 DIAGNOSIS — M79604 Pain in right leg: Secondary | ICD-10-CM | POA: Diagnosis not present

## 2016-03-23 DIAGNOSIS — M5431 Sciatica, right side: Secondary | ICD-10-CM

## 2016-03-23 MED ORDER — GABAPENTIN 100 MG PO CAPS: 100.0000 mg | ORAL_CAPSULE | Freq: Every day | ORAL | Status: DC

## 2016-03-23 NOTE — Progress Notes (Signed)
Patient: Debbie Dennis, Female    DOB: 1946-06-30, 70 y.o.   MRN: [id number] Visit Date: 03/23/2016  Today's Provider: Wilhemena Durie, MD   Chief Complaint  Patient presents with  . Annual Exam   Subjective:   [name] is a 70 y.o. female who presents today for her Subsequent Annual Wellness Visit. She feels well. She reports exercising 3 days weekly. She reports she is sleeping poorly.  Immunization History  Administered Date(s) Administered  . Influenza, High Dose Seasonal PF 07/22/2015  . Pneumococcal Conjugate-13 08/31/2014  . Pneumococcal Polysaccharide-23 07/11/2007, 01/16/2013  . Tdap 08/25/2008  . Zoster 03/02/2012   09/02/2015 Mammogram 05/14/2015 Colonoscopy-hemorrhoids, polyps, F/U 5 years 01/08/2013 Pap Smear 10/21/2015 Diabetic eye exam 01/20/2016 Cataract surgery  Review of Systems  Constitutional: Negative.   HENT: Positive for rhinorrhea and sneezing.   Eyes: Negative.   Respiratory: Negative.   Cardiovascular: Positive for leg swelling.  Gastrointestinal: Negative.   Endocrine: Negative.   Genitourinary: Negative.   Musculoskeletal: Positive for back pain, arthralgias and neck pain.  Skin: Negative.   Allergic/Immunologic: Negative.   Neurological: Positive for light-headedness and headaches.  Hematological: Negative.   Psychiatric/Behavioral: Negative.     Patient Active Problem List   Diagnosis Date Noted  . Anxiety 02/05/2015  . Allergic rhinitis 02/05/2015  . Arthritis 02/05/2015  . Acute exacerbation of chronic obstructive airways disease ([place]) 02/05/2015  . 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 Insulin dependent type 1 diabetes mellitus ([place]) 02/05/2015  . Bloodgood disease 02/05/2015  . Acid reflux 02/05/2015  . HLD (hyperlipidemia) 02/05/2015  . BP (high blood pressure) 02/05/2015  . Adult hypothyroidism 02/05/2015  . Adaptive colitis 02/05/2015  . Osteopenia 02/05/2015  . Restless leg 02/05/2015  . History of tobacco  abuse 02/05/2015  . Venous stasis syndrome 02/05/2015    Social History   Social History  . Marital Status: Married    Spouse Name: Sonia Side  . Number of Children: 1  . Years of Education: 12   Occupational History  . retired    Social History Main Topics  . Smoking status: Former Smoker -- 1.00 packs/day for 35 years    Types: Cigarettes    Quit date: 10/02/2012  . Smokeless tobacco: Never Used  . Alcohol Use: No     Comment: rarely  . Drug Use: No  . Sexual Activity: Not Currently    Birth Control/ Protection: None   Other Topics Concern  . Not on file   Social History Narrative    Past Surgical History  Procedure Laterality Date  . Abdominal hysterectomy      has ovaries-due to abnormal pap smears  . Colonoscopy with propofol N/A 05/14/2015    Procedure: COLONOSCOPY WITH PROPOFOL;  Surgeon: Josefine Class, MD;  Location: [facility] ENDOSCOPY;  Service: Endoscopy;  Laterality: N/A;  . Breast biopsy Left 1988  . Cataract extraction w/phaco Left 01/20/2016    Procedure: CATARACT EXTRACTION PHACO AND INTRAOCULAR LENS PLACEMENT (IOC);  Surgeon: Leandrew Koyanagi, MD;  Location: [facility] ORS;  Service: Ophthalmology;  Laterality: Left;  Lot # [id number] H US:02:01:5 AP:20.2% CDE: 24.51    Her family history includes Arthritis in her sister; Asthma in her maternal grandfather; Breast cancer (age of onset: 101) in her sister; COPD in her father; Cancer in her mother; Heart attack in her mother; Heart disease in her father; Lung cancer in her brother; Throat cancer in her father.    Outpatient Prescriptions Prior to Visit  Medication Sig Dispense Refill  .  ALPRAZolam (XANAX) 0.5 MG tablet TAKE ONE TO TWO TABLETS BY MOUTH AT BEDTIME 60 tablet 5  . Butalbital-APAP-Caffeine 50-325-40 MG per capsule Take by mouth. Reported on 01/20/2016    . cholecalciferol (VITAMIN D) 1000 UNITS tablet Take 1,000 Units by mouth daily. Reported on 01/20/2016    . fluticasone (FLONASE) 50 MCG/ACT nasal  spray Place into both nostrils daily.    Marland Kitchen glucose blood (ONE TOUCH ULTRA TEST) test strip Patient to test six (6) times daily 200 each 12  . insulin aspart (NOVOLOG) 100 UNIT/ML injection Sliding scale- up to 60 units daily DX: E11.9 10 mL 11  . LANTUS 100 UNIT/ML injection INJECT 10 UNITS SUBCUTANEOUSLY AT BEDTIME (Patient taking differently: 8 UNITS HS) 10 mL 12  . levothyroxine (SYNTHROID, LEVOTHROID) 50 MCG tablet TAKE ONE TABLET BY MOUTH ONCE DAILY 30 tablet 12  . loratadine (CLARITIN) 10 MG tablet TAKE ONE TABLET BY MOUTH ONCE DAILY 30 tablet 12  . meloxicam (MOBIC) 7.5 MG tablet Take by mouth. Reported on 01/20/2016    . omeprazole (PRILOSEC) 20 MG capsule Take 1 capsule (20 mg total) by mouth daily. 30 capsule 12  . topiramate (TOPAMAX) 50 MG tablet Take by mouth 2 (two) times daily.      No facility-administered medications prior to visit.    Allergies  Allergen Reactions  . Codeine Nausea And Vomiting  . Sulfa Antibiotics Rash    Patient Care Team: Jerrol Banana., MD as PCP - General (Family Medicine)  Objective:   Vitals:  Filed Vitals:   03/23/16 1416  BP: 118/68  Pulse: 68  Temp: 97.5 F (36.4 C)  TempSrc: Oral  Resp: 16  Height: 5\' 1"  (1.549 m)  Weight: 128 lb (58.06 kg)    Physical Exam  Constitutional: She is oriented to person, place, and time. She appears well-developed and well-nourished.  HENT:  Head: Normocephalic and atraumatic.  Right Ear: External ear normal.  Left Ear: External ear normal.  Nose: Nose normal.  Mouth/Throat: Oropharynx is clear and moist.  Eyes: Conjunctivae and EOM are normal. Pupils are equal, round, and reactive to light.  Neck: Normal range of motion. Neck supple.  Cardiovascular: Normal rate, regular rhythm, normal heart sounds and intact distal pulses.   Pulmonary/Chest: Effort normal and breath sounds normal.  Abdominal: Soft. Bowel sounds are normal.  Musculoskeletal: Normal range of motion.  Neurological: She  is alert and oriented to person, place, and time.  Skin: Skin is warm and dry.  Psychiatric: She has a normal mood and affect. Her behavior is normal. Judgment and thought content normal.    Activities of Daily Living In your present state of health, do you have any difficulty performing the following activities: 03/23/2016  Hearing? Y  Vision? N  Difficulty concentrating or making decisions? N  Walking or climbing stairs? N  Dressing or bathing? N  Doing errands, shopping? N    Fall Risk Assessment Fall Risk  03/23/2016 03/23/2015  Falls in the past year? No No     Depression Screen PHQ 2/9 Scores 03/23/2016 03/23/2015  PHQ - 2 Score 2 2  PHQ- 9 Score 8 4    Cognitive Testing - 6-CIT    Year: 0 4 points  Month: 0 3 points  Memorize "Pia Mau, [address], [place]"  Time (within 1 hour:) 0 3 points  Count backwards from 20: 0 2 4 points  Name months of year: 0 2 4 points  Repeat Address: 0 2 4 6  8 10 points   Total Score: 6/28  Interpretation : Normal (0-7) Abnormal (8-28)    Assessment & Plan:     Annual Wellness Visit  Reviewed patient's Family Medical History Reviewed and updated list of patient's medical providers Assessment of cognitive impairment was done Assessed patient's functional ability Established a written schedule for health screening [place] Completed and Reviewed  Exercise Activities and Dietary recommendations Goals    None      Immunization History  Administered Date(s) Administered  . Influenza, High Dose Seasonal PF 07/22/2015  . Pneumococcal Conjugate-13 08/31/2014  . Pneumococcal Polysaccharide-23 07/11/2007, 01/16/2013  . Tdap 08/25/2008  . Zoster 03/02/2012    Health Maintenance  Topic Date Due  . Hepatitis C Screening  08/01/1946  . FOOT EXAM  03/22/2016  . URINE MICROALBUMIN  03/22/2016  . INFLUENZA VACCINE  05/02/2016  . HEMOGLOBIN A1C  05/25/2016  . OPHTHALMOLOGY EXAM  10/20/2016  . MAMMOGRAM   09/01/2017  . TETANUS/TDAP  08/25/2018  . COLONOSCOPY  05/13/2025  . DEXA SCAN  Completed  . ZOSTAVAX  Completed  . PNA vac Low Risk Adult  Completed      Discussed health benefits of physical activity, and encouraged her to engage in regular exercise appropriate for her age and condition.            Status post left cataract surgery May 2017, she is scheduled for right cataract removal November 2017            Type 1 diabetes--she has very labile blood sugars and I again recommended strongly that we refer to endocrinology. She refuses to do this.            Diabetic neuropathy--try gabapentin 100 mg daily at bedtime and after a week increase to 200 mg daily at bedtime. Return to clinic 3 months for A1c and follow-up neuropathy.  Miguel Aschoff MD [facility] 03/23/2016 2:21 PM  ------------------------------------------------------------------------------------------------------------

## 2016-03-24 ENCOUNTER — Other Ambulatory Visit: Payer: Self-pay | Admitting: Family Medicine

## 2016-05-22 ENCOUNTER — Other Ambulatory Visit: Payer: Self-pay | Admitting: Family Medicine

## 2016-05-22 MED ORDER — GLUCOSE BLOOD VI STRP: ORAL_STRIP | 12 refills | Status: DC

## 2016-05-22 NOTE — Telephone Encounter (Signed)
Pt is requesting a Rx to change her Test stripes to Reli On Ultima.  [place]  CB#[phone]/MW  Pt is requesting these sent to the pharmacy today if possible.

## 2016-05-22 NOTE — Telephone Encounter (Signed)
Done-aa 

## 2016-05-23 ENCOUNTER — Ambulatory Visit: Payer: Medicare Other | Admitting: Family Medicine

## 2016-06-10 ENCOUNTER — Other Ambulatory Visit: Payer: Self-pay | Admitting: Family Medicine

## 2016-06-21 ENCOUNTER — Other Ambulatory Visit: Payer: Self-pay | Admitting: Family Medicine

## 2016-07-21 DIAGNOSIS — E162 Hypoglycemia, unspecified: Secondary | ICD-10-CM | POA: Diagnosis not present

## 2016-07-21 DIAGNOSIS — E161 Other hypoglycemia: Secondary | ICD-10-CM | POA: Diagnosis not present

## 2016-07-24 ENCOUNTER — Ambulatory Visit (INDEPENDENT_AMBULATORY_CARE_PROVIDER_SITE_OTHER): Payer: Medicare Other | Admitting: Family Medicine

## 2016-07-24 VITALS — BP 118/62 | HR 80 | Temp 97.7°F | Resp 14 | Wt 133.0 lb

## 2016-07-24 DIAGNOSIS — E109 Type 1 diabetes mellitus without complications: Secondary | ICD-10-CM | POA: Diagnosis not present

## 2016-07-24 DIAGNOSIS — Z23 Encounter for immunization: Secondary | ICD-10-CM

## 2016-07-24 DIAGNOSIS — E038 Other specified hypothyroidism: Secondary | ICD-10-CM | POA: Diagnosis not present

## 2016-07-24 DIAGNOSIS — W19XXXA Unspecified fall, initial encounter: Secondary | ICD-10-CM

## 2016-07-24 DIAGNOSIS — M79604 Pain in right leg: Secondary | ICD-10-CM

## 2016-07-24 DIAGNOSIS — G629 Polyneuropathy, unspecified: Secondary | ICD-10-CM

## 2016-07-24 DIAGNOSIS — M25562 Pain in left knee: Secondary | ICD-10-CM

## 2016-07-24 DIAGNOSIS — M5431 Sciatica, right side: Secondary | ICD-10-CM

## 2016-07-24 LAB — POCT GLYCOSYLATED HEMOGLOBIN (HGB A1C): Hemoglobin A1C: 5.5

## 2016-07-24 MED ORDER — GABAPENTIN 100 MG PO CAPS: 100.0000 mg | ORAL_CAPSULE | Freq: Three times a day (TID) | ORAL | 12 refills | Status: DC

## 2016-07-24 NOTE — Progress Notes (Signed)
Debbie Dennis  MRN: [id number] DOB: 11-27-1945  Subjective:  HPI  Patient is here for follow up last visit was in June for wellness visit. Last follow up was in February and routine labs were done at that time. On her last visit in June for diabetic neuropathy Gabapentin was added and patient was advised to start with 1 tablet daily and then increase to 2 tablets daily but prescription was only written for 30 tablets so she has been taking 1 tablet daily. Symptoms have improved some with this regimen.  Diabetes: patient is checking her sugar about 6 to 10 times daily. She checks blood sugar between 8-12 times daily. She does have hypoglycemia but says she can deal with it. She says she has labile blood sugars. Lab Results  Component Value Date   HGBA1C 6.0 (H) 11/26/2015   Patient fell about 8 weeks ago. She was at home vacuuming and when she went to pull the vacuum cord it got tangled up and pulled her and she fell with the vacuum to the ground and landed on her left leg/side twisting her leg and foot in the process. She did not seek medical treatment at that time. She has a walker and crutches so she used that for about 6 weeks to help her walk. She had bruising on the side of her left foot and leg after that. Her pain is better but left knee still bothering her. She has Meloxicam at home so she took that daily for 3 to 4 weeks but is not sure if that was helping or not. Patient talked and vented at some length about her to husband who is having chronic problems with anxiety. 3 has not been able to help him.  Patient Active Problem List   Diagnosis Date Noted  . Anxiety 02/05/2015  . Allergic rhinitis 02/05/2015  . Arthritis 02/05/2015  . Acute exacerbation of chronic obstructive airways disease ([place]) 02/05/2015  . 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 Insulin dependent type 1 diabetes mellitus ([place]) 02/05/2015  . Bloodgood disease 02/05/2015  . Acid reflux 02/05/2015  . HLD  (hyperlipidemia) 02/05/2015  . BP (high blood pressure) 02/05/2015  . Adult hypothyroidism 02/05/2015  . Adaptive colitis 02/05/2015  . Osteopenia 02/05/2015  . Restless leg 02/05/2015  . History of tobacco abuse 02/05/2015  . Venous stasis syndrome 02/05/2015    Past Medical History:  Diagnosis Date  . Adaptive colitis   . Anxiety   . Arthritis   . Bronchitis    CHRONIC  . Buedinger-Ludloff-Laewen disease   . Diabetes mellitus without complication ([place])   . GERD (gastroesophageal reflux disease)   . Headache    MIGRAINES  . Hypertension   . Hypothyroidism   . Restless leg syndrome     Social History   Social History  . Marital status: Married    Spouse name: Sonia Side  . Number of children: 1  . Years of education: 55   Occupational History  . retired    Social History Main Topics  . Smoking status: Former Smoker    Packs/day: 1.00    Years: 35.00    Types: Cigarettes    Quit date: 10/02/2012  . Smokeless tobacco: Never Used  . Alcohol use No     Comment: rarely  . Drug use: No  . Sexual activity: Not Currently    Birth control/ protection: None   Other Topics Concern  . Not on file   Social History Narrative  . No narrative  on file    Outpatient Encounter Prescriptions as of 07/24/2016  Medication Sig Note  . ALPRAZolam (XANAX) 0.5 MG tablet TAKE ONE TO TWO TABLETS BY MOUTH AT BEDTIME   . Butalbital-APAP-Caffeine 50-325-40 MG per capsule Take by mouth. Reported on 01/20/2016 02/05/2015: Medication taken as needed. disregard Fioricet with codeine RX-aa Received from: [facility]  . fluticasone (FLONASE) 50 MCG/ACT nasal spray Place into both nostrils daily.   Marland Kitchen gabapentin (NEURONTIN) 100 MG capsule Take 1 capsule (100 mg total) by mouth at bedtime.   Marland Kitchen glucose blood test strip Checks sugar 6 times daily -Needs Reli on ultima strips-DX:E11.9   . insulin aspart (NOVOLOG) 100 UNIT/ML injection Sliding scale- up to 60 units daily DX: E11.9   .  LANTUS 100 UNIT/ML injection INJECT 10 UNITS SUBCUTANEOUSLY AT BEDTIME (Patient taking differently: 8 UNITS HS)   . levothyroxine (SYNTHROID, LEVOTHROID) 50 MCG tablet TAKE ONE TABLET BY MOUTH ONCE DAILY   . loratadine (CLARITIN) 10 MG tablet TAKE ONE TABLET BY MOUTH ONCE DAILY   . meloxicam (MOBIC) 7.5 MG tablet Take by mouth. Reported on 01/20/2016 02/05/2015: Medication taken as needed.  Received from: [facility]  . omeprazole (PRILOSEC) 20 MG capsule Take 1 capsule (20 mg total) by mouth daily.   Marland Kitchen topiramate (TOPAMAX) 50 MG tablet TAKE TWO TABLETS BY MOUTH ONCE DAILY   . [DISCONTINUED] cholecalciferol (VITAMIN D) 1000 UNITS tablet Take 1,000 Units by mouth daily. Reported on 01/20/2016    No facility-administered encounter medications on file as of 07/24/2016.     Allergies  Allergen Reactions  . Codeine Nausea And Vomiting  . Sulfa Antibiotics Rash    Review of Systems  Constitutional: Negative.   HENT: Negative.   Eyes: Negative.   Respiratory: Negative.   Cardiovascular: Negative.   Gastrointestinal: Negative.   Musculoskeletal: Positive for falls, joint pain and myalgias.  Skin: Negative.   Neurological: Positive for tingling. Negative for dizziness.  Endo/Heme/Allergies: Negative.   Psychiatric/Behavioral: Negative.    Objective:  BP 118/62   Pulse 80   Temp 97.7 F (36.5 C)   Resp 14   Wt 133 lb (60.3 kg)   BMI 25.13 kg/m   Physical Exam  Constitutional: She is oriented to person, place, and time and well-developed, well-nourished, and in no distress.  HENT:  Head: Normocephalic and atraumatic.  Eyes: Conjunctivae are normal. Pupils are equal, round, and reactive to light.  Neck: Normal range of motion. Neck supple.  Cardiovascular: Normal rate, regular rhythm, normal heart sounds and intact distal pulses.   No murmur heard. Pulmonary/Chest: Effort normal and breath sounds normal. No respiratory distress. She has no wheezes.  Abdominal: Soft.   Musculoskeletal: She exhibits no edema or tenderness.  Neurological: She is alert and oriented to person, place, and time. No cranial nerve deficit. She exhibits normal muscle tone. Gait normal. GCS score is 15.  Skin: Skin is warm and dry.  Psychiatric: Mood, memory, affect and judgment normal.    Assessment and Plan :  1. Insulin dependent type 1 diabetes mellitus (HCC) A1C 5.5 today. Discussed this with patient and that it is too tight of a control with her sugar. Advised patient to decrease Lantus at least to 6 units daily and decrease Novolog sliding scale. Advised patient of possible risks of hypoglycemia and how much insulin she is taking. - POCT HgB A1C--5.5 For several years I have expressed my concern to the patient that her diabetes is too tightly controlled. I have told  her at least a dozen times at think she needs to see endocrinology. Cut back on 2 units of each dose of insulin that she takes. I would rather see her A1c at 7.5-8 than where it is at 5.5. I am very concerned about the possibility of severe hypoglycemia and resulting issues and have shared  with the patient this concern many times 2. Fall, initial encounter Discussed this. Patient is feeling better.  3. Need for influenza vaccination - Flu vaccine HIGH DOSE PF (Fluzone High dose)  4. Other specified hypothyroidism  5. Neuropathy ([place]) Better. Will go ahead and increase Gabapentin to TID. Follow.  6. Acute pain of left knee Better. Exam is stable today. Knee sprain, resolving. 7. Adjustment reaction This is mainly due to this or severe anxiety several by her husband.   HPI, Exam and A&P transcribed under direction and in the presence of Miguel Aschoff, MD. I have done the exam and reviewed the chart and it is accurate to the best of my knowledge. Miguel Aschoff M.D. [facility]

## 2016-08-08 ENCOUNTER — Other Ambulatory Visit: Payer: Self-pay | Admitting: Family Medicine

## 2016-08-08 DIAGNOSIS — K219 Gastro-esophageal reflux disease without esophagitis: Secondary | ICD-10-CM

## 2016-08-11 ENCOUNTER — Other Ambulatory Visit: Payer: Self-pay | Admitting: Family Medicine

## 2016-08-11 DIAGNOSIS — Z1231 Encounter for screening mammogram for malignant neoplasm of breast: Secondary | ICD-10-CM

## 2016-08-20 ENCOUNTER — Other Ambulatory Visit: Payer: Self-pay | Admitting: Family Medicine

## 2016-08-20 DIAGNOSIS — E109 Type 1 diabetes mellitus without complications: Secondary | ICD-10-CM

## 2016-08-29 DIAGNOSIS — H2511 Age-related nuclear cataract, right eye: Secondary | ICD-10-CM | POA: Diagnosis not present

## 2016-08-30 ENCOUNTER — Encounter: Payer: Self-pay | Admitting: Family Medicine

## 2016-09-14 ENCOUNTER — Ambulatory Visit
Admission: RE | Admit: 2016-09-14 | Discharge: 2016-09-14 | Disposition: A | Discharge status: Home / Self Care | Payer: Medicare Other | Source: Ambulatory Visit | Attending: Family Medicine | Admitting: Family Medicine

## 2016-09-14 DIAGNOSIS — Z1231 Encounter for screening mammogram for malignant neoplasm of breast: Secondary | ICD-10-CM | POA: Insufficient documentation

## 2016-09-18 ENCOUNTER — Other Ambulatory Visit: Payer: Self-pay | Admitting: Family Medicine

## 2016-09-21 ENCOUNTER — Telehealth: Payer: Self-pay

## 2016-09-21 NOTE — Telephone Encounter (Signed)
-----   Message from Leo Rod sent at 09/21/2016  1:08 PM EST ----- Regarding: Call w/ Results of Mammogram Contact: [phone] pls call pt with results of her mammogram, call on husband's cell [phone] - thnx, kb

## 2016-09-21 NOTE — Telephone Encounter (Signed)
Patient advised results normal-aa

## 2016-10-16 ENCOUNTER — Other Ambulatory Visit: Payer: Self-pay | Admitting: Family Medicine

## 2016-11-16 DIAGNOSIS — H2511 Age-related nuclear cataract, right eye: Secondary | ICD-10-CM | POA: Diagnosis not present

## 2016-11-21 ENCOUNTER — Encounter: Payer: Self-pay | Admitting: *Deleted

## 2016-11-28 ENCOUNTER — Encounter
Admission: RE | Disposition: A | Discharge status: Home / Self Care | Payer: Self-pay | Source: Ambulatory Visit | Attending: Ophthalmology

## 2016-11-28 ENCOUNTER — Ambulatory Visit: Payer: Medicare Other | Admitting: Anesthesiology

## 2016-11-28 ENCOUNTER — Encounter: Payer: Self-pay | Admitting: *Deleted

## 2016-11-28 ENCOUNTER — Ambulatory Visit
Admission: RE | Admit: 2016-11-28 | Discharge: 2016-11-28 | Disposition: A | Discharge status: Home / Self Care | Payer: Medicare Other | Source: Ambulatory Visit | Attending: Ophthalmology | Admitting: Ophthalmology

## 2016-11-28 DIAGNOSIS — J449 Chronic obstructive pulmonary disease, unspecified: Secondary | ICD-10-CM | POA: Diagnosis not present

## 2016-11-28 DIAGNOSIS — Z794 Long term (current) use of insulin: Secondary | ICD-10-CM | POA: Insufficient documentation

## 2016-11-28 DIAGNOSIS — I10 Essential (primary) hypertension: Secondary | ICD-10-CM | POA: Insufficient documentation

## 2016-11-28 DIAGNOSIS — E1036 Type 1 diabetes mellitus with diabetic cataract: Secondary | ICD-10-CM | POA: Diagnosis not present

## 2016-11-28 DIAGNOSIS — F419 Anxiety disorder, unspecified: Secondary | ICD-10-CM | POA: Diagnosis not present

## 2016-11-28 DIAGNOSIS — I739 Peripheral vascular disease, unspecified: Secondary | ICD-10-CM | POA: Insufficient documentation

## 2016-11-28 DIAGNOSIS — Z87891 Personal history of nicotine dependence: Secondary | ICD-10-CM | POA: Insufficient documentation

## 2016-11-28 DIAGNOSIS — H2511 Age-related nuclear cataract, right eye: Secondary | ICD-10-CM | POA: Diagnosis not present

## 2016-11-28 DIAGNOSIS — K219 Gastro-esophageal reflux disease without esophagitis: Secondary | ICD-10-CM | POA: Insufficient documentation

## 2016-11-28 DIAGNOSIS — E039 Hypothyroidism, unspecified: Secondary | ICD-10-CM | POA: Diagnosis not present

## 2016-11-28 DIAGNOSIS — Z79899 Other long term (current) drug therapy: Secondary | ICD-10-CM | POA: Insufficient documentation

## 2016-11-28 HISTORY — PX: CATARACT EXTRACTION W/PHACO: SHX586

## 2016-11-28 LAB — GLUCOSE, CAPILLARY
Glucose-Capillary: 243 mg/dL — ABNORMAL HIGH (ref 65–99)
Glucose-Capillary: 173 mg/dL — ABNORMAL HIGH (ref 65–99)

## 2016-11-28 SURGERY — PHACOEMULSIFICATION, CATARACT, WITH IOL INSERTION
Anesthesia: Monitor Anesthesia Care | Site: Eye | Laterality: Right | Wound class: Clean

## 2016-11-28 MED ORDER — ARMC OPHTHALMIC DILATING DROPS
OPHTHALMIC | Status: AC
  Administered 2016-11-28: 1 via OPHTHALMIC
  Filled 2016-11-28: qty 0.4

## 2016-11-28 MED ORDER — MOXIFLOXACIN HCL 0.5 % OP SOLN
OPHTHALMIC | Status: AC
  Administered 2016-11-28: 1 [drp] via OPHTHALMIC
  Filled 2016-11-28: qty 3

## 2016-11-28 MED ORDER — MIDAZOLAM HCL 2 MG/2ML IJ SOLN
INTRAMUSCULAR | Status: DC | PRN
  Administered 2016-11-28: .5 mg via INTRAVENOUS
  Administered 2016-11-28: 1 mg via INTRAVENOUS
  Administered 2016-11-28: .5 mg via INTRAVENOUS

## 2016-11-28 MED ORDER — FENTANYL CITRATE (PF) 100 MCG/2ML IJ SOLN
INTRAMUSCULAR | Status: AC
  Filled 2016-11-28: qty 2

## 2016-11-28 MED ORDER — EPINEPHRINE PF 1 MG/ML IJ SOLN
INTRAOCULAR | Status: DC | PRN
  Administered 2016-11-28: 1 mL via OPHTHALMIC

## 2016-11-28 MED ORDER — NEOMYCIN-POLYMYXIN-DEXAMETH 3.5-10000-0.1 OP OINT
TOPICAL_OINTMENT | OPHTHALMIC | Status: AC
  Filled 2016-11-28: qty 3.5

## 2016-11-28 MED ORDER — EPINEPHRINE PF 1 MG/ML IJ SOLN
INTRAMUSCULAR | Status: AC
  Filled 2016-11-28: qty 2

## 2016-11-28 MED ORDER — NEOMYCIN-POLYMYXIN-DEXAMETH 0.1 % OP OINT
TOPICAL_OINTMENT | OPHTHALMIC | Status: DC | PRN
  Administered 2016-11-28: 1 via OPHTHALMIC

## 2016-11-28 MED ORDER — ARMC OPHTHALMIC DILATING DROPS
1.0000 "application " | OPHTHALMIC | Status: AC | PRN
  Administered 2016-11-28 (×3): 1 via OPHTHALMIC

## 2016-11-28 MED ORDER — NA HYALUR & NA CHOND-NA HYALUR 0.55-0.5 ML IO KIT
PACK | INTRAOCULAR | Status: AC
  Filled 2016-11-28: qty 1.05

## 2016-11-28 MED ORDER — NA HYALUR & NA CHOND-NA HYALUR 0.4-0.35 ML IO KIT
PACK | INTRAOCULAR | Status: DC | PRN
  Administered 2016-11-28: 1 mL via INTRAOCULAR

## 2016-11-28 MED ORDER — SODIUM CHLORIDE 0.9 % IV SOLN
INTRAVENOUS | Status: DC
  Administered 2016-11-28 (×2): via INTRAVENOUS

## 2016-11-28 MED ORDER — FENTANYL CITRATE (PF) 100 MCG/2ML IJ SOLN
INTRAMUSCULAR | Status: DC | PRN
  Administered 2016-11-28: 50 ug via INTRAVENOUS

## 2016-11-28 MED ORDER — CEFUROXIME OPHTHALMIC INJECTION 1 MG/0.1 ML
INJECTION | OPHTHALMIC | Status: AC
Start: 2016-11-28 — End: 2016-11-28
  Filled 2016-11-28: qty 0.1

## 2016-11-28 MED ORDER — MOXIFLOXACIN HCL 0.5 % OP SOLN
OPHTHALMIC | Status: DC | PRN
  Administered 2016-11-28: 0.2 mL via OPHTHALMIC

## 2016-11-28 MED ORDER — MIDAZOLAM HCL 2 MG/2ML IJ SOLN
INTRAMUSCULAR | Status: AC
Start: 2016-11-28 — End: 2016-11-28
  Filled 2016-11-28: qty 2

## 2016-11-28 MED ORDER — CARBACHOL 0.01 % IO SOLN
INTRAOCULAR | Status: DC | PRN
  Administered 2016-11-28: .5 mL via INTRAOCULAR

## 2016-11-28 MED ORDER — POVIDONE-IODINE 5 % OP SOLN
OPHTHALMIC | Status: DC | PRN
  Administered 2016-11-28: 1 via OPHTHALMIC

## 2016-11-28 MED ORDER — LIDOCAINE HCL (PF) 4 % IJ SOLN
INTRAOCULAR | Status: DC | PRN
  Administered 2016-11-28: 2.25 mL via OPHTHALMIC

## 2016-11-28 MED ORDER — MOXIFLOXACIN HCL 0.5 % OP SOLN
1.0000 [drp] | Freq: Once | OPHTHALMIC | Status: AC
  Administered 2016-11-28: 1 [drp] via OPHTHALMIC

## 2016-11-28 SURGICAL SUPPLY — 22 items
CANNULA ANT/CHMB 27GA (MISCELLANEOUS) ×3 IMPLANT
CARTRIDGE ABBOTT (MISCELLANEOUS) ×3 IMPLANT
CUP MEDICINE 2OZ PLAST GRAD ST (MISCELLANEOUS) ×3 IMPLANT
GLOVE BIO SURGEON STRL SZ8 (GLOVE) ×3 IMPLANT
GLOVE BIOGEL M 6.5 STRL (GLOVE) ×3 IMPLANT
GLOVE SURG LX 7.5 STRW (GLOVE) ×2
GLOVE SURG LX STRL 7.5 STRW (GLOVE) ×1 IMPLANT
GOWN STRL REUS W/ TWL LRG LVL3 (GOWN DISPOSABLE) ×2 IMPLANT
GOWN STRL REUS W/TWL LRG LVL3 (GOWN DISPOSABLE) ×4
LENS IOL TECNIS SYMFONY 24.5 ×3 IMPLANT
PACK CATARACT (MISCELLANEOUS) ×3 IMPLANT
PACK CATARACT BRASINGTON LX (MISCELLANEOUS) ×3 IMPLANT
PACK EYE AFTER SURG (MISCELLANEOUS) ×3 IMPLANT
SOL BSS BAG (MISCELLANEOUS) ×3
SOL PREP PVP 2OZ (MISCELLANEOUS) ×3
SOLUTION BSS BAG (MISCELLANEOUS) ×1 IMPLANT
SOLUTION PREP PVP 2OZ (MISCELLANEOUS) ×1 IMPLANT
SYR 3ML LL SCALE MARK (SYRINGE) ×3 IMPLANT
SYR 5ML LL (SYRINGE) ×3 IMPLANT
SYR TB 1ML 27GX1/2 LL (SYRINGE) ×3 IMPLANT
WATER STERILE IRR 250ML POUR (IV SOLUTION) ×3 IMPLANT
WIPE NON LINTING 3.25X3.25 (MISCELLANEOUS) ×3 IMPLANT

## 2016-11-28 NOTE — Anesthesia Post-op Follow-up Note (Cosign Needed)
Anesthesia QCDR form completed.        

## 2016-11-28 NOTE — Anesthesia Procedure Notes (Signed)
Procedure Name: MAC Date/Time: 11/28/2016 9:30 AM Performed by: Allean Found Pre-anesthesia Checklist: Patient identified, Emergency Drugs available, Suction available, Patient being monitored and Timeout performed Patient Re-evaluated:Patient Re-evaluated prior to inductionOxygen Delivery Method: Nasal cannula Placement Confirmation: positive ETCO2 Dental Injury: Teeth and Oropharynx as per pre-operative assessment

## 2016-11-28 NOTE — Discharge Instructions (Signed)
Eye Surgery Discharge Instructions  Expect mild scratchy sensation or mild soreness. DO NOT RUB YOUR EYE!  The day of surgery:  Minimal physical activity, but bed rest is not required  No reading, computer work, or close hand work  No bending, lifting, or straining.  May watch TV  For 24 hours:  No driving, legal decisions, or alcoholic beverages  Safety precautions  Eat anything you prefer: It is better to start with liquids, then soup then solid foods.  _____ Eye patch should be worn until postoperative exam tomorrow.  ____ Solar shield eyeglasses should be worn for comfort in the sunlight/patch while sleeping  Resume all regular medications including aspirin or Coumadin if these were discontinued prior to surgery. You may shower, bathe, shave, or wash your hair. Tylenol may be taken for mild discomfort.  Call your doctor if you experience significant pain, nausea, or vomiting, fever > 101 or other signs of infection. [phone] or [phone] Specific instructions:  Follow-up Information    BRASINGTON,CHADWICK, MD Follow up on 11/29/2016.   Specialty:  Ophthalmology Why:  1:55 Contact information: [address]   [place] [address] [phone]

## 2016-11-28 NOTE — Transfer of Care (Signed)
Immediate Anesthesia Transfer of Care Note  Patient: Debbie Dennis  Procedure(s) Performed: Procedure(s) with comments: CATARACT EXTRACTION PHACO AND INTRAOCULAR LENS PLACEMENT (IOC) (Right) - [place] 01:01 AP% 18.3 CDE 11.26 Fluid pack lot # [id number]  Patient Location: PACU  Anesthesia Type:MAC  Level of Consciousness: awake and alert   Airway & Oxygen Therapy: Patient Spontanous Breathing and Patient connected to nasal cannula oxygen  Post-op Assessment: Report given to RN and Post -op Vital signs reviewed and stable  Post vital signs: Reviewed and stable  Last Vitals:  Vitals:   11/28/16 0833  BP: 138/76  Pulse: 73  Resp: 20  Temp: 36.7 C    Last Pain:  Vitals:   11/28/16 0833  TempSrc: Oral      Patients Stated Pain Goal: 0 ([id number])  Complications: No apparent anesthesia complications

## 2016-11-28 NOTE — Op Note (Signed)
OPERATIVE NOTE  Debbie Dennis [id number] 11/28/2016   PREOPERATIVE DIAGNOSIS:  Nuclear Sclerotic Cataract Right Eye H25.11   POSTOPERATIVE DIAGNOSIS: Nuclear Sclerotic Cataract Right Eye H25.11          PROCEDURE:  Phacoemusification with posterior chamber intraocular lens placement of the right eye   LENS:   Implant Name Type Inv. Item Serial No. Manufacturer Lot No. LRB No. Used  LENS IOL SYMFONY 24.5 - [id number]   LENS IOL SYMFONY 24.5 [id number] AMO   Right 1       ULTRASOUND TIME: 18 %  of 1 minutes 0 seconds, CDE 11.3  SURGEON:  Wyonia Hough, MD   ANESTHESIA:  Topical with tetracaine drops and 2% Xylocaine jelly, augmented with 1% preservative-free intracameral lidocaine.    COMPLICATIONS:  None.   DESCRIPTION OF PROCEDURE:  The patient was identified in the holding room and transported to the operating room and placed in the supine position under the operating microscope. Theright eye was identified as the operative eye and it was prepped and draped in the usual sterile ophthalmic fashion.   A 1 millimeter clear-corneal paracentesis was made at the 12:00 position.  0.5 ml of preservative-free 1% lidocaine was injected into the anterior chamber. The anterior chamber was filled with Viscoat viscoelastic.  A 2.4 millimeter keratome was used to make a near-clear corneal incision at the 9:00 position. A curvilinear capsulorrhexis was made with a cystotome and capsulorrhexis forceps.  Balanced salt solution was used to hydrodissect and hydrodelineate the nucleus.   Phacoemulsification was then used in stop and chop fashion to remove the lens nucleus and epinucleus.  The remaining cortex was then removed using the irrigation and aspiration handpiece. Provisc was then placed into the capsular bag to distend it for lens placement.  A lens was then injected into the capsular bag.  The remaining viscoelastic was aspirated.  Wounds were hydrated with balanced salt solution.   The anterior chamber was inflated to a physiologic pressure with balanced salt solution. Vigamox 0.2 ml of a 1mg  per ml solution was injected into the anterior chamber for a dose of 0.2 mg of intracameral antibiotic at the completion of the case. Miostat was placed into the anterior chamber to constrict the pupil.  No wound leaks were noted.  Topical Vigamox drops and Maxitrol ointment were applied to the eye.  The patient was taken to the recovery room in stable condition without complications of anesthesia or surgery.  Debbie Dennis 11/28/2016, 9:49 AM

## 2016-11-28 NOTE — H&P (Signed)
The History and Physical notes are on paper, have been signed, and are to be scanned. The patient remains stable and unchanged from the H&P.   Previous H&P reviewed, patient examined, and there are no changes.  Darlinda Bellows 11/28/2016 8:57 AM

## 2016-11-28 NOTE — Anesthesia Preprocedure Evaluation (Signed)
Anesthesia Evaluation  Patient identified by MRN, date of birth, ID band Patient awake    Reviewed: Allergy & Precautions, NPO status , Patient's Chart, lab work & pertinent test results  History of Anesthesia Complications Negative for: history of anesthetic complications  Airway Mallampati: II       Dental   Pulmonary COPD, former smoker,           Cardiovascular hypertension, Pt. on medications + Peripheral Vascular Disease       Neuro/Psych Anxiety    GI/Hepatic Neg liver ROS, GERD  Medicated,  Endo/Other  diabetes, Type 1, Insulin DependentHypothyroidism   Renal/GU negative Renal ROS     Musculoskeletal   Abdominal   Peds  Hematology negative hematology ROS (+)   Anesthesia Other Findings   Reproductive/Obstetrics                             Anesthesia Physical Anesthesia Plan  ASA: III  Anesthesia Plan: MAC   Post-op Pain Management:    Induction:   Airway Management Planned:   Additional Equipment:   Intra-op Plan:   Post-operative Plan:   Informed Consent: I have reviewed the patients History and Physical, chart, labs and discussed the procedure including the risks, benefits and alternatives for the proposed anesthesia with the patient or authorized representative who has indicated his/her understanding and acceptance.     Plan Discussed with:   Anesthesia Plan Comments:         Anesthesia Quick Evaluation

## 2016-11-28 NOTE — Anesthesia Postprocedure Evaluation (Signed)
Anesthesia Post Note  Patient: Debbie Dennis  Procedure(s) Performed: Procedure(s) (LRB): CATARACT EXTRACTION PHACO AND INTRAOCULAR LENS PLACEMENT (IOC) (Right)  Patient location during evaluation: Short Stay Anesthesia Type: MAC Level of consciousness: awake and alert Pain management: pain level controlled Vital Signs Assessment: post-procedure vital signs reviewed and stable Respiratory status: spontaneous breathing Cardiovascular status: blood pressure returned to baseline Postop Assessment: no headache Anesthetic complications: no     Last Vitals:  Vitals:   11/28/16 0833  BP: 138/76  Pulse: 73  Resp: 20  Temp: 36.7 C    Last Pain:  Vitals:   11/28/16 0833  TempSrc: Oral                 Buckner Malta

## 2016-11-29 ENCOUNTER — Encounter: Payer: Self-pay | Admitting: Ophthalmology

## 2016-12-08 ENCOUNTER — Other Ambulatory Visit: Payer: Self-pay | Admitting: Family Medicine

## 2016-12-10 DIAGNOSIS — E162 Hypoglycemia, unspecified: Secondary | ICD-10-CM | POA: Diagnosis not present

## 2016-12-10 DIAGNOSIS — E161 Other hypoglycemia: Secondary | ICD-10-CM | POA: Diagnosis not present

## 2017-01-01 ENCOUNTER — Telehealth: Payer: Self-pay | Admitting: Family Medicine

## 2017-01-01 NOTE — Telephone Encounter (Signed)
Pt needs rx sent into [facility] for test strips for the [employer] Blu glucose monitor.  Thank sTeri

## 2017-01-02 MED ORDER — GLUCOSE BLOOD VI STRP: ORAL_STRIP | 12 refills | Status: DC

## 2017-01-02 NOTE — Telephone Encounter (Signed)
Done-aa 

## 2017-01-03 ENCOUNTER — Other Ambulatory Visit: Payer: Self-pay

## 2017-01-03 MED ORDER — GLUCOSE BLOOD VI STRP: ORAL_STRIP | 12 refills | Status: DC

## 2017-01-22 ENCOUNTER — Encounter: Payer: Self-pay | Admitting: Family Medicine

## 2017-01-22 ENCOUNTER — Ambulatory Visit (INDEPENDENT_AMBULATORY_CARE_PROVIDER_SITE_OTHER): Payer: Medicare Other | Admitting: Family Medicine

## 2017-01-22 VITALS — BP 124/60 | HR 68 | Temp 97.4°F | Resp 16 | Wt 132.0 lb

## 2017-01-22 DIAGNOSIS — G629 Polyneuropathy, unspecified: Secondary | ICD-10-CM | POA: Diagnosis not present

## 2017-01-22 DIAGNOSIS — E109 Type 1 diabetes mellitus without complications: Secondary | ICD-10-CM

## 2017-01-22 DIAGNOSIS — K219 Gastro-esophageal reflux disease without esophagitis: Secondary | ICD-10-CM | POA: Diagnosis not present

## 2017-01-22 LAB — POCT GLYCOSYLATED HEMOGLOBIN (HGB A1C): HEMOGLOBIN A1C: 6.3

## 2017-01-22 NOTE — Progress Notes (Signed)
Subjective:  HPI  Diabetes Mellitus Type I, Follow-up:   Lab Results  Component Value Date   HGBA1C 5.5 07/24/2016   HGBA1C 6.0 (H) 11/26/2015   HGBA1C 6.0 07/22/2015    Last seen for diabetes 6 months ago.  Management since then includes none. She reports good compliance with treatment. She is not having side effects.  Home blood sugar records: "good, up and down"  Episodes of hypoglycemia? Occasionally when she has not eaten.  Current Insulin Regimen: Lantus 6 units at night. Novolog, sliding scale.   Current exercise: gardening  Pertinent Labs:    Component Value Date/Time   CHOL 163 11/26/2015 0959   TRIG 63 11/26/2015 0959   HDL 57 11/26/2015 0959   LDLCALC 93 11/26/2015 0959   CREATININE 1.13 (H) 11/26/2015 0959    Wt Readings from Last 3 Encounters:  01/22/17 132 lb (59.9 kg)  11/28/16 129 lb (58.5 kg)  07/24/16 133 lb (60.3 kg)   ------------------------------------------------------------------------    Prior to Admission medications   Medication Sig Start Date End Date Taking? Authorizing Provider  ALPRAZolam Duanne Moron) 0.5 MG tablet TAKE ONE TO TWO TABLETS BY MOUTH AT BEDTIME 10/17/16   Richard Maceo Pro., MD  Butalbital-APAP-Caffeine [phone] MG per capsule Take by mouth. Reported on 01/20/2016 04/23/13   Historical Provider, MD  fluticasone (FLONASE) 50 MCG/ACT nasal spray Place into both nostrils daily.    Historical Provider, MD  gabapentin (NEURONTIN) 100 MG capsule Take 1 capsule (100 mg total) by mouth 3 (three) times daily. 07/24/16   Richard Maceo Pro., MD  glucose blood test strip Checks sugar 6 times daily -Needs ReliOn premier blue strips-DX:E11.9 01/03/17   Jerrol Banana., MD  insulin aspart (NOVOLOG) 100 UNIT/ML injection Per sliding scale: up to 60 units daily. DX E11.9 12/08/16   Richard Maceo Pro., MD  LANTUS 100 UNIT/ML injection INJECT 10 UNITS SUBCUTANEOUSLY AT BEDTIME Patient taking differently: 8 units at bedtime 08/21/16    Richard Maceo Pro., MD  levothyroxine (SYNTHROID, LEVOTHROID) 50 MCG tablet TAKE ONE TABLET BY MOUTH ONCE DAILY 08/09/16   Jerrol Banana., MD  loratadine (CLARITIN) 10 MG tablet TAKE ONE TABLET BY MOUTH ONCE DAILY 09/18/16   Jerrol Banana., MD  meloxicam (MOBIC) 7.5 MG tablet Take by mouth. Reported on 01/20/2016 08/31/14   Historical Provider, MD  omeprazole (PRILOSEC) 20 MG capsule TAKE ONE CAPSULE BY MOUTH ONCE DAILY 08/09/16   Jerrol Banana., MD  ranitidine (ZANTAC) 150 MG tablet TAKE ONE TABLET BY MOUTH TWICE DAILY 09/18/16   Jerrol Banana., MD  topiramate (TOPAMAX) 50 MG tablet TAKE TWO TABLETS BY MOUTH ONCE DAILY 03/27/16   Jerrol Banana., MD    Patient Active Problem List   Diagnosis Date Noted  . Anxiety 02/05/2015  . Allergic rhinitis 02/05/2015  . Arthritis 02/05/2015  . Acute exacerbation of chronic obstructive airways disease ([place]) 02/05/2015  . 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 Insulin dependent type 1 diabetes mellitus ([place]) 02/05/2015  . Bloodgood disease 02/05/2015  . Acid reflux 02/05/2015  . HLD (hyperlipidemia) 02/05/2015  . BP (high blood pressure) 02/05/2015  . Adult hypothyroidism 02/05/2015  . Adaptive colitis 02/05/2015  . Osteopenia 02/05/2015  . Restless leg 02/05/2015  . History of tobacco abuse 02/05/2015  . Venous stasis syndrome 02/05/2015    Past Medical History:  Diagnosis Date  . Adaptive colitis   . Anxiety   . Arthritis   .  Bronchitis    CHRONIC  . Buedinger-Ludloff-Laewen disease   . Diabetes mellitus without complication ([place])   . GERD (gastroesophageal reflux disease)   . Headache    MIGRAINES  . Hypertension   . Hypothyroidism   . Restless leg syndrome     Social History   Social History  . Marital status: Married    Spouse name: Sonia Side  . Number of children: 1  . Years of education: 18   Occupational History  . retired    Social History Main Topics  . Smoking status:  Former Smoker    Packs/day: 1.00    Years: 35.00    Types: Cigarettes    Quit date: 10/02/2012  . Smokeless tobacco: Never Used  . Alcohol use No     Comment: rarely  . Drug use: No  . Sexual activity: Not Currently    Birth control/ protection: None   Other Topics Concern  . Not on file   Social History Narrative  . No narrative on file    Allergies  Allergen Reactions  . Codeine Nausea And Vomiting  . Sulfa Antibiotics Rash    Review of Systems  Constitutional: Negative.   HENT: Positive for congestion.   Eyes: Negative.   Respiratory: Negative.   Cardiovascular: Negative.   Gastrointestinal: Negative.   Genitourinary: Negative.   Musculoskeletal: Positive for back pain.  Skin: Negative.   Neurological: Negative.   Endo/Heme/Allergies: Negative.   Psychiatric/Behavioral: Negative.     Immunization History  Administered Date(s) Administered  . Influenza, High Dose Seasonal PF 07/22/2015, 07/24/2016  . Pneumococcal Conjugate-13 08/31/2014  . Pneumococcal Polysaccharide-23 07/11/2007, 01/16/2013  . Tdap 08/25/2008  . Zoster 03/02/2012    Objective:  BP 124/60 (BP Location: Left Arm, Patient Position: Sitting, Cuff Size: Normal)   Pulse 68   Temp 97.4 F (36.3 C) (Oral)   Resp 16   Wt 132 lb (59.9 kg)   BMI 24.94 kg/m   Physical Exam  Constitutional: She is oriented to person, place, and time and well-developed, well-nourished, and in no distress.  HENT:  Head: Normocephalic and atraumatic.  Eyes: Conjunctivae and EOM are normal. Pupils are equal, round, and reactive to light.  Neck: Normal range of motion. Neck supple.  Cardiovascular: Normal rate, regular rhythm, normal heart sounds and intact distal pulses.   Pulmonary/Chest: Effort normal and breath sounds normal.  Abdominal: Soft.  Musculoskeletal: Normal range of motion.  Neurological: She is alert and oriented to person, place, and time. She has normal reflexes. Gait normal. GCS score is 15.    Skin: Skin is warm and dry.  Psychiatric: Mood, memory, affect and judgment normal.    Lab Results  Component Value Date   WBC 5.9 11/26/2015   HGB 13.0 12/29/2014   HCT 40.9 11/26/2015   PLT 292 11/26/2015   GLUCOSE 227 (H) 11/26/2015   CHOL 163 11/26/2015   TRIG 63 11/26/2015   HDL 57 11/26/2015   LDLCALC 93 11/26/2015   TSH 1.230 03/23/2015   HGBA1C 5.5 07/24/2016   MICROALBUR 20 03/23/2015    CMP     Component Value Date/Time   NA 141 11/26/2015 0959   K 5.0 11/26/2015 0959   CL 104 11/26/2015 0959   CO2 21 11/26/2015 0959   GLUCOSE 227 (H) 11/26/2015 0959   BUN 13 11/26/2015 0959   CREATININE 1.13 (H) 11/26/2015 0959   CALCIUM 9.2 11/26/2015 0959   PROT 5.7 (L) 11/26/2015 0959   ALBUMIN 4.0 11/26/2015 0959  AST 16 11/26/2015 0959   ALT 9 11/26/2015 0959   ALKPHOS 87 11/26/2015 0959   BILITOT 0.4 11/26/2015 0959   GFRNONAA 50 (L) 11/26/2015 0959   GFRAA 58 (L) 11/26/2015 0959    Assessment and Plan :  1. Insulin dependent type 1 diabetes mellitus (HCC)  - POCT HgB A1C 6.3 today. A1c has been too tightly controlled for years, at her age would like to see blood sugars between 6.5 and 7 to avoid low blood sugar. Pt to decrease lantus by 1 unit and decrease Novolog sliding scale by 1 unit. Follow up in 4-6 months.   2. Neuropathy   3. Gastroesophageal reflux disease, esophagitis presence not specified   HPI, Exam, and A&P Transcribed under the direction and in the presence of Richard L. Cranford Mon, MD  Electronically Signed: Katina Dung, [place] MD [facility] 01/22/2017 4:00 PM

## 2017-01-22 NOTE — Patient Instructions (Signed)
Decrease 1 unit of Lantus and 1 unit per Novolog injection to get blood sugars up just a little.

## 2017-03-19 ENCOUNTER — Other Ambulatory Visit: Payer: Self-pay | Admitting: Family Medicine

## 2017-03-27 ENCOUNTER — Ambulatory Visit: Payer: Medicare Other | Admitting: Family Medicine

## 2017-03-27 ENCOUNTER — Ambulatory Visit: Payer: Medicare Other

## 2017-04-12 ENCOUNTER — Other Ambulatory Visit: Payer: Self-pay | Admitting: Family Medicine

## 2017-04-17 ENCOUNTER — Ambulatory Visit (INDEPENDENT_AMBULATORY_CARE_PROVIDER_SITE_OTHER): Payer: Medicare Other | Admitting: Family Medicine

## 2017-04-17 ENCOUNTER — Ambulatory Visit (INDEPENDENT_AMBULATORY_CARE_PROVIDER_SITE_OTHER): Payer: Medicare Other

## 2017-04-17 VITALS — BP 104/60 | HR 72 | Temp 98.7°F | Ht 61.0 in | Wt 124.6 lb

## 2017-04-17 DIAGNOSIS — G629 Polyneuropathy, unspecified: Secondary | ICD-10-CM

## 2017-04-17 DIAGNOSIS — K219 Gastro-esophageal reflux disease without esophagitis: Secondary | ICD-10-CM

## 2017-04-17 DIAGNOSIS — E784 Other hyperlipidemia: Secondary | ICD-10-CM | POA: Diagnosis not present

## 2017-04-17 DIAGNOSIS — F419 Anxiety disorder, unspecified: Secondary | ICD-10-CM

## 2017-04-17 DIAGNOSIS — Z0000 Encounter for general adult medical examination without abnormal findings: Secondary | ICD-10-CM | POA: Diagnosis not present

## 2017-04-17 DIAGNOSIS — E109 Type 1 diabetes mellitus without complications: Secondary | ICD-10-CM

## 2017-04-17 DIAGNOSIS — E7849 Other hyperlipidemia: Secondary | ICD-10-CM

## 2017-04-17 DIAGNOSIS — E038 Other specified hypothyroidism: Secondary | ICD-10-CM

## 2017-04-17 DIAGNOSIS — Z1159 Encounter for screening for other viral diseases: Secondary | ICD-10-CM | POA: Diagnosis not present

## 2017-04-17 LAB — POCT UA - MICROALBUMIN: MICROALBUMIN (UR) POC: 20 mg/L

## 2017-04-17 NOTE — Progress Notes (Signed)
Debbie Dennis  MRN: [id number] DOB: 04/14/1946  Subjective:  HPI  Patient is here for routine follow up. Patient saw Debbie Dennis for wellness visit today. Last follow up visit for patient was 11/26/15. Diabetes" patient checks her sugar several times a day. Patient uses Novolog insulin sliding scale and Lantus insulin 6 units at bedtime. Lab Results  Component Value Date   HGBA1C 6.3 01/22/2017   Wt Readings from Last 3 Encounters:  04/17/17 124 lb 9.6 oz (56.5 kg)  04/17/17 124 lb 9.6 oz (56.5 kg)  01/22/17 132 lb (59.9 kg)    GERD: patient takes Omeprazole and Ranitidine as needed for flare up symptoms.  Mammogram 09/14/16 BMD 06/10/13 osteopenia Pap smear 01/08/13 normal Colonoscopy 05/14/15 hyperplastic changes.  Immunization History  Administered Date(s) Administered  . Influenza, High Dose Seasonal PF 07/22/2015, 07/24/2016  . Pneumococcal Conjugate-13 08/31/2014  . Pneumococcal Polysaccharide-23 07/11/2007, 01/16/2013  . Tdap 08/25/2008  . Zoster 03/02/2012   Patient Active Problem List   Diagnosis Date Noted  . Anxiety 02/05/2015  . Allergic rhinitis 02/05/2015  . Arthritis 02/05/2015  . Acute exacerbation of chronic obstructive airways disease ([place]) 02/05/2015  . 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 Insulin dependent type 1 diabetes mellitus ([place]) 02/05/2015  . Bloodgood disease 02/05/2015  . Acid reflux 02/05/2015  . HLD (hyperlipidemia) 02/05/2015  . BP (high blood pressure) 02/05/2015  . Adult hypothyroidism 02/05/2015  . Adaptive colitis 02/05/2015  . Osteopenia 02/05/2015  . Restless leg 02/05/2015  . History of tobacco abuse 02/05/2015  . Venous stasis syndrome 02/05/2015    Past Medical History:  Diagnosis Date  . Adaptive colitis   . Anxiety   . Arthritis   . Bronchitis    CHRONIC  . Buedinger-Ludloff-Laewen disease   . Diabetes mellitus without complication ([place])   . GERD (gastroesophageal reflux disease)   . Headache    MIGRAINES    . Hypertension   . Hypothyroidism   . Restless leg syndrome     Social History   Social History  . Marital status: Married    Spouse name: Debbie Dennis  . Number of children: 1  . Years of education: 55   Occupational History  . retired    Social History Main Topics  . Smoking status: Former Smoker    Packs/day: 1.00    Years: 35.00    Types: Cigarettes    Quit date: 10/02/2012  . Smokeless tobacco: Never Used  . Alcohol use No     Comment: rarely  . Drug use: No  . Sexual activity: Not Currently    Birth control/ protection: None   Other Topics Concern  . Not on file   Social History Narrative  . No narrative on file    Outpatient Encounter Prescriptions as of 04/17/2017  Medication Sig  . ALPRAZolam (XANAX) 0.5 MG tablet TAKE ONE TO TWO TABLETS BY MOUTH AT BEDTIME AS NEEDED  . fluticasone (FLONASE) 50 MCG/ACT nasal spray Place into both nostrils daily.  Marland Kitchen gabapentin (NEURONTIN) 100 MG capsule Take 1 capsule (100 mg total) by mouth 3 (three) times daily.  Marland Kitchen glucose blood test strip Checks sugar 6 times daily -Needs ReliOn premier blue strips-DX:E11.9  . insulin aspart (NOVOLOG) 100 UNIT/ML injection Per sliding scale: up to 60 units daily. DX E11.9  . LANTUS 100 UNIT/ML injection INJECT 10 UNITS SUBCUTANEOUSLY AT BEDTIME (Patient taking differently: 6 units at bedtime)  . levothyroxine (SYNTHROID, LEVOTHROID) 50 MCG tablet TAKE ONE TABLET BY MOUTH ONCE DAILY  .  loratadine (CLARITIN) 10 MG tablet TAKE ONE TABLET BY MOUTH ONCE DAILY  . omeprazole (PRILOSEC) 20 MG capsule TAKE ONE CAPSULE BY MOUTH ONCE DAILY (Patient taking differently: TAKE ONE CAPSULE BY MOUTH ONCE DAILY as needed)  . ranitidine (ZANTAC) 150 MG tablet TAKE ONE TABLET BY MOUTH TWICE DAILY (Patient taking differently: TAKE ONE TABLET BY MOUTH TWICE DAILY as needed)  . topiramate (TOPAMAX) 50 MG tablet TAKE TWO TABLETS BY MOUTH ONCE DAILY   No facility-administered encounter medications on file as of 04/17/2017.      Allergies  Allergen Reactions  . Codeine Nausea And Vomiting  . Sulfa Antibiotics Rash    Review of Systems  Constitutional: Negative.   HENT: Negative.   Eyes: Negative.   Respiratory: Negative.   Cardiovascular: Positive for leg swelling.  Gastrointestinal: Negative.   Genitourinary: Negative.   Musculoskeletal: Positive for joint pain.  Skin: Negative.   Neurological: Positive for headaches.  Endo/Heme/Allergies: Negative.   Psychiatric/Behavioral: Negative.     Objective:  BP 104/60   Pulse 72   Temp 98.7 F (37.1 C)   Ht 5\' 1"  (1.549 m)   Wt 124 lb 9.6 oz (56.5 kg)   BMI 23.54 kg/m   Physical Exam  Constitutional: She is oriented to person, place, and time and well-developed, well-nourished, and in no distress.  HENT:  Head: Normocephalic and atraumatic.  Eyes: Pupils are equal, round, and reactive to light. Conjunctivae are normal.  Neck: Normal range of motion. Neck supple.  Cardiovascular: Normal rate, regular rhythm, normal heart sounds and intact distal pulses.  Exam reveals no gallop.   No murmur heard. Pulmonary/Chest: Effort normal and breath sounds normal. No respiratory distress. She has no wheezes.  Breast exam normal.  Musculoskeletal: She exhibits no tenderness.  Neurological: She is alert and oriented to person, place, and time. Gait normal. GCS score is 15.  Foot exam normal.  Skin: Skin is warm and dry.  Psychiatric: Mood, memory, affect and judgment normal.   Diabetic Foot Exam - Simple   Simple Foot Form Diabetic Foot exam was performed with the following findings:  Yes 04/17/2017  2:53 PM  Visual Inspection No deformities, no ulcerations, no other skin breakdown bilaterally:  Yes Sensation Testing Intact to touch and monofilament testing bilaterally:  Yes Pulse Check Posterior Tibialis and Dorsalis pulse intact bilaterally:  Yes Comments    Assessment and Plan :  1. Insulin dependent type 1 diabetes mellitus (Debbie Dennis) Check through  lab work, too soon to check level today. Patient advised to get lab work done August 1st or after. Cut back on insulin by 2 units per dose. - POCT UA - Microalbumin - HgB A1c  2. Neuropathy - CBC with Differential/Platelet  3. Gastroesophageal reflux disease, esophagitis presence not specified Stable.  4. Other specified hypothyroidism - TSH  5. Other hyperlipidemia - Comprehensive metabolic panel - Lipid Panel With LDL/HDL Ratio  6. Anxiety Stable.  HPI, Exam and A&P transcribed by Theressa Millard, RMA under direction and in the presence of Miguel Aschoff, MD. I have done the exam and reviewed the chart and it is accurate to the best of my knowledge. Development worker, community has been used and  any errors in dictation or transcription are unintentional. Miguel Aschoff M.D. [facility]

## 2017-04-17 NOTE — Patient Instructions (Signed)
Debbie Dennis , Thank you for taking time to come for your Medicare Wellness Visit. I appreciate your ongoing commitment to your health goals. Please review the following plan we discussed and let me know if I can assist you in the future.   Screening recommendations/referrals: Colonoscopy: completed 05/14/15, due 05/2025 Mammogram: completed 09/15/16, due 09/2017 Bone Density: completed 06/10/13 Recommended yearly ophthalmology/optometry visit for glaucoma screening and checkup Recommended yearly dental visit for hygiene and checkup  Vaccinations: Influenza vaccine: due 06/2017 Pneumococcal vaccine: completed series Tdap vaccine: completed 08/25/08, due 08/2018 Shingles vaccine: completed 03/02/12  Advanced directives: Advance directive discussed with you today. Even though you declined this today please call our office should you change your mind and we can give you the proper paperwork for you to fill out.  Conditions/risks identified: Fall risk prevention; Recommend increasing water intake to 4-6 glasses a day.   Next appointment: None, need to schedule 1 year AWV   Preventive Care 66 Years and Older, Female Preventive care refers to lifestyle choices and visits with your health care provider that can promote health and wellness. What does preventive care include?  A yearly physical exam. This is also called an annual well check.  Dental exams once or twice a year.  Routine eye exams. Ask your health care provider how often you should have your eyes checked.  Personal lifestyle choices, including:  Daily care of your teeth and gums.  Regular physical activity.  Eating a healthy diet.  Avoiding tobacco and drug use.  Limiting alcohol use.  Practicing safe sex.  Taking low-dose aspirin every day.  Taking vitamin and mineral supplements as recommended by your health care provider. What happens during an annual well check? The services and screenings done by your health  care provider during your annual well check will depend on your age, overall health, lifestyle risk factors, and family history of disease. Counseling  Your health care provider may ask you questions about your:  Alcohol use.  Tobacco use.  Drug use.  Emotional well-being.  Home and relationship well-being.  Sexual activity.  Eating habits.  History of falls.  Memory and ability to understand (cognition).  Work and work Statistician.  Reproductive health. Screening  You may have the following tests or measurements:  Height, weight, and BMI.  Blood pressure.  Lipid and cholesterol levels. These may be checked every 5 years, or more frequently if you are over 71 years old.  Skin check.  Lung cancer screening. You may have this screening every year starting at age 27 if you have a 30-pack-year history of smoking and currently smoke or have quit within the past 15 years.  Fecal occult blood test (FOBT) of the stool. You may have this test every year starting at age 48.  Flexible sigmoidoscopy or colonoscopy. You may have a sigmoidoscopy every 5 years or a colonoscopy every 10 years starting at age 79.  Hepatitis C blood test.  Hepatitis B blood test.  Sexually transmitted disease (STD) testing.  Diabetes screening. This is done by checking your blood sugar (glucose) after you have not eaten for a while (fasting). You may have this done every 1-3 years.  Bone density scan. This is done to screen for osteoporosis. You may have this done starting at age 50.  Mammogram. This may be done every 1-2 years. Talk to your health care provider about how often you should have regular mammograms. Talk with your health care provider about your test results, treatment options, and  if necessary, the need for more tests. Vaccines  Your health care provider may recommend certain vaccines, such as:  Influenza vaccine. This is recommended every year.  Tetanus, diphtheria, and  acellular pertussis (Tdap, Td) vaccine. You may need a Td booster every 10 years.  Zoster vaccine. You may need this after age 75.  Pneumococcal 13-valent conjugate (PCV13) vaccine. One dose is recommended after age 49.  Pneumococcal polysaccharide (PPSV23) vaccine. One dose is recommended after age 30. Talk to your health care provider about which screenings and vaccines you need and how often you need them. This information is not intended to replace advice given to you by your health care provider. Make sure you discuss any questions you have with your health care provider. Document Released: 10/15/2015 Document Revised: 06/07/2016 Document Reviewed: 07/20/2015 Elsevier Interactive Patient Education  2017 [place] Prevention in the Home Falls can cause injuries. They can happen to people of all ages. There are many things you can do to make your home safe and to help prevent falls. What can I do on the outside of my home?  Regularly fix the edges of walkways and driveways and fix any cracks.  Remove anything that might make you trip as you walk through a door, such as a raised step or threshold.  Trim any bushes or trees on the path to your home.  Use bright outdoor lighting.  Clear any walking paths of anything that might make someone trip, such as rocks or tools.  Regularly check to see if handrails are loose or broken. Make sure that both sides of any steps have handrails.  Any raised decks and porches should have guardrails on the edges.  Have any leaves, snow, or ice cleared regularly.  Use sand or salt on walking paths during winter.  Clean up any spills in your garage right away. This includes oil or grease spills. What can I do in the bathroom?  Use night lights.  Install grab bars by the toilet and in the tub and shower. Do not use towel bars as grab bars.  Use non-skid mats or decals in the tub or shower.  If you need to sit down in the shower, use  a plastic, non-slip stool.  Keep the floor dry. Clean up any water that spills on the floor as soon as it happens.  Remove soap buildup in the tub or shower regularly.  Attach bath mats securely with double-sided non-slip rug tape.  Do not have throw rugs and other things on the floor that can make you trip. What can I do in the bedroom?  Use night lights.  Make sure that you have a light by your bed that is easy to reach.  Do not use any sheets or blankets that are too big for your bed. They should not hang down onto the floor.  Have a firm chair that has side arms. You can use this for support while you get dressed.  Do not have throw rugs and other things on the floor that can make you trip. What can I do in the kitchen?  Clean up any spills right away.  Avoid walking on wet floors.  Keep items that you use a lot in easy-to-reach places.  If you need to reach something above you, use a strong step stool that has a grab bar.  Keep electrical cords out of the way.  Do not use floor polish or wax that makes floors slippery. If you  must use wax, use non-skid floor wax.  Do not have throw rugs and other things on the floor that can make you trip. What can I do with my stairs?  Do not leave any items on the stairs.  Make sure that there are handrails on both sides of the stairs and use them. Fix handrails that are broken or loose. Make sure that handrails are as long as the stairways.  Check any carpeting to make sure that it is firmly attached to the stairs. Fix any carpet that is loose or worn.  Avoid having throw rugs at the top or bottom of the stairs. If you do have throw rugs, attach them to the floor with carpet tape.  Make sure that you have a light switch at the top of the stairs and the bottom of the stairs. If you do not have them, ask someone to add them for you. What else can I do to help prevent falls?  Wear shoes that:  Do not have high heels.  Have  rubber bottoms.  Are comfortable and fit you well.  Are closed at the toe. Do not wear sandals.  If you use a stepladder:  Make sure that it is fully opened. Do not climb a closed stepladder.  Make sure that both sides of the stepladder are locked into place.  Ask someone to hold it for you, if possible.  Clearly mark and make sure that you can see:  Any grab bars or handrails.  First and last steps.  Where the edge of each step is.  Use tools that help you move around (mobility aids) if they are needed. These include:  Canes.  Walkers.  Scooters.  Crutches.  Turn on the lights when you go into a dark area. Replace any light bulbs as soon as they burn out.  Set up your furniture so you have a clear path. Avoid moving your furniture around.  If any of your floors are uneven, fix them.  If there are any pets around you, be aware of where they are.  Review your medicines with your doctor. Some medicines can make you feel dizzy. This can increase your chance of falling. Ask your doctor what other things that you can do to help prevent falls. This information is not intended to replace advice given to you by your health care provider. Make sure you discuss any questions you have with your health care provider. Document Released: 07/15/2009 Document Revised: 02/24/2016 Document Reviewed: 10/23/2014 Elsevier Interactive Patient Education  2017 [employer].

## 2017-04-17 NOTE — Progress Notes (Signed)
Subjective:   Debbie Dennis is a 71 y.o. female who presents for Medicare Annual (Subsequent) preventive examination.  Review of Systems:  N/A  Cardiac Risk Factors include: advanced age (>3men, >70 women);diabetes mellitus;dyslipidemia     Objective:     Vitals: BP 104/60 (BP Location: Left Arm)   Pulse 72   Temp 98.7 F (37.1 C) (Oral)   Ht 5\' 1"  (1.549 m)   Wt 124 lb 9.6 oz (56.5 kg)   BMI 23.54 kg/m   Body mass index is 23.54 kg/m.   Tobacco History  Smoking Status  . Former Smoker  . Packs/day: 1.00  . Years: 35.00  . Types: Cigarettes  . Quit date: 10/02/2012  Smokeless Tobacco  . Never Used     Counseling given: Not Answered   Past Medical History:  Diagnosis Date  . Adaptive colitis   . Anxiety   . Arthritis   . Bronchitis    CHRONIC  . Buedinger-Ludloff-Laewen disease   . Diabetes mellitus without complication ([place])   . GERD (gastroesophageal reflux disease)   . Headache    MIGRAINES  . Hypertension   . Hypothyroidism   . Restless leg syndrome    Past Surgical History:  Procedure Laterality Date  . ABDOMINAL HYSTERECTOMY     has ovaries-due to abnormal pap smears  . BREAST EXCISIONAL BIOPSY Left 1988  . BREAST SURGERY     lumpectomy  . CATARACT EXTRACTION W/PHACO Left 01/20/2016   Procedure: CATARACT EXTRACTION PHACO AND INTRAOCULAR LENS PLACEMENT (IOC);  Surgeon: Leandrew Koyanagi, MD;  Location: [facility] ORS;  Service: Ophthalmology;  Laterality: Left;  Lot # [id number] H US:02:01:5 AP:20.2% CDE: 24.51  . CATARACT EXTRACTION W/PHACO Right 11/28/2016   Procedure: CATARACT EXTRACTION PHACO AND INTRAOCULAR LENS PLACEMENT (IOC);  Surgeon: Leandrew Koyanagi, MD;  Location: [facility] ORS;  Service: Ophthalmology;  Laterality: Right;  [place] 01:01 AP% 18.3 CDE 11.26 Fluid pack lot # [id number]  . COLONOSCOPY WITH PROPOFOL N/A 05/14/2015   Procedure: COLONOSCOPY WITH PROPOFOL;  Surgeon: Josefine Class, MD;  Location: [facility];  Service: Endoscopy;   Laterality: N/A;  . EYE SURGERY     cataract left   Family History  Problem Relation Age of Onset  . Cancer Mother        spread into her lungs-died from heart issues   . Heart attack Mother   . COPD Father   . Throat cancer Father   . Heart disease Father   . Arthritis Sister   . Breast cancer Sister 31  . Lung cancer Brother   . Asthma Maternal Grandfather    History  Sexual Activity  . Sexual activity: Not Currently  . Birth control/ protection: None    Outpatient Encounter Prescriptions as of 04/17/2017  Medication Sig  . ALPRAZolam (XANAX) 0.5 MG tablet TAKE ONE TO TWO TABLETS BY MOUTH AT BEDTIME AS NEEDED  . fluticasone (FLONASE) 50 MCG/ACT nasal spray Place into both nostrils daily.  Marland Kitchen gabapentin (NEURONTIN) 100 MG capsule Take 1 capsule (100 mg total) by mouth 3 (three) times daily.  Marland Kitchen glucose blood test strip Checks sugar 6 times daily -Needs ReliOn premier blue strips-DX:E11.9  . insulin aspart (NOVOLOG) 100 UNIT/ML injection Per sliding scale: up to 60 units daily. DX E11.9  . LANTUS 100 UNIT/ML injection INJECT 10 UNITS SUBCUTANEOUSLY AT BEDTIME (Patient taking differently: 6 units at bedtime)  . levothyroxine (SYNTHROID, LEVOTHROID) 50 MCG tablet TAKE ONE TABLET BY MOUTH ONCE DAILY  . loratadine (CLARITIN) 10 MG  tablet TAKE ONE TABLET BY MOUTH ONCE DAILY  . omeprazole (PRILOSEC) 20 MG capsule TAKE ONE CAPSULE BY MOUTH ONCE DAILY (Patient taking differently: TAKE ONE CAPSULE BY MOUTH ONCE DAILY as needed)  . ranitidine (ZANTAC) 150 MG tablet TAKE ONE TABLET BY MOUTH TWICE DAILY (Patient taking differently: TAKE ONE TABLET BY MOUTH TWICE DAILY as needed)  . topiramate (TOPAMAX) 50 MG tablet TAKE TWO TABLETS BY MOUTH ONCE DAILY  . [DISCONTINUED] Butalbital-APAP-Caffeine 50-325-40 MG per capsule Take by mouth. Reported on 01/20/2016  . [DISCONTINUED] meloxicam (MOBIC) 7.5 MG tablet Take by mouth. Reported on 01/20/2016   No facility-administered encounter medications on  file as of 04/17/2017.     Activities of Daily Living In your present state of health, do you have any difficulty performing the following activities: 04/17/2017  Hearing? Y  Vision? N  Difficulty concentrating or making decisions? N  Walking or climbing stairs? N  Dressing or bathing? N  Doing errands, shopping? N  Preparing Food and eating ? N  Using the Toilet? N  In the past six months, have you accidently leaked urine? N  Do you have problems with loss of bowel control? N  Managing your Medications? N  Managing your Finances? N  Housekeeping or managing your Housekeeping? N  Some recent data might be hidden    Patient Care Team: Jerrol Banana., MD as PCP - General (Family Medicine) Leandrew Koyanagi, MD as Referring Physician (Ophthalmology)    Assessment:      Exercise Activities and Dietary recommendations Current Exercise Habits: Home exercise routine, Type of exercise: walking;treadmill, Time (Minutes): 25, Frequency (Times/Week): 3 (to 4 day), Weekly Exercise (Minutes/Week): 75, Intensity: Mild, Exercise limited by: None identified  Goals    . Increase water intake          Recommend increasing water intake to 4-6 glasses a day.       Fall Risk Fall Risk  04/17/2017 07/24/2016 03/23/2016 03/23/2015  Falls in the past year? Yes Yes No No  Number falls in past yr: 1 1 - -  Injury with Fall? Yes No - -  Follow up Falls prevention discussed - - -   Depression Screen PHQ 2/9 Scores 04/17/2017 04/17/2017 03/23/2016 03/23/2015  PHQ - 2 Score 0 0 2 2  PHQ- 9 Score 2 - 8 4     Cognitive Function     6CIT Screen 04/17/2017  What Year? 0 points  What month? 0 points  What time? 0 points  Count back from 20 0 points  Months in reverse 0 points  Repeat phrase 4 points  Total Score 4    Immunization History  Administered Date(s) Administered  . Influenza, High Dose Seasonal PF 07/22/2015, 07/24/2016  . Pneumococcal Conjugate-13 08/31/2014  .  Pneumococcal Polysaccharide-23 07/11/2007, 01/16/2013  . Tdap 08/25/2008  . Zoster 03/02/2012   Screening Tests Health Maintenance  Topic Date Due  . FOOT EXAM  03/22/2016  . URINE MICROALBUMIN  03/22/2016  . INFLUENZA VACCINE  05/02/2017  . HEMOGLOBIN A1C  07/24/2017  . OPHTHALMOLOGY EXAM  12/31/2017  . TETANUS/TDAP  08/25/2018  . MAMMOGRAM  09/14/2018  . COLONOSCOPY  05/13/2025  . DEXA SCAN  Completed  . Hepatitis C Screening  Completed  . PNA vac Low Risk Adult  Completed      Plan:  I have personally reviewed and addressed the Medicare Annual Wellness questionnaire and have noted the following in the patient's chart:  A. Medical and social history B.  Use of alcohol, tobacco or illicit drugs  C. Current medications and supplements D. Functional ability and status E.  Nutritional status F.  Physical activity G. Advance directives H. List of other physicians I.  Hospitalizations, surgeries, and ER visits in previous 12 months J.  [place] such as hearing and vision if needed, cognitive and depression L. Referrals and appointments - none  In addition, I have reviewed and discussed with patient certain preventive protocols, quality metrics, and best practice recommendations. A written personalized care plan for preventive services as well as general preventive health recommendations were provided to patient.  See attached scanned questionnaire for additional information.   Signed,  Fabio Neighbors, LPN Nurse Health Advisor   MD Recommendations: Pt needs a diabetic foot exam done today and a urine microalbumin check.

## 2017-05-10 DIAGNOSIS — E038 Other specified hypothyroidism: Secondary | ICD-10-CM | POA: Diagnosis not present

## 2017-05-10 DIAGNOSIS — G629 Polyneuropathy, unspecified: Secondary | ICD-10-CM | POA: Diagnosis not present

## 2017-05-10 DIAGNOSIS — E784 Other hyperlipidemia: Secondary | ICD-10-CM | POA: Diagnosis not present

## 2017-05-10 DIAGNOSIS — E109 Type 1 diabetes mellitus without complications: Secondary | ICD-10-CM | POA: Diagnosis not present

## 2017-05-11 LAB — COMPREHENSIVE METABOLIC PANEL
ALBUMIN: 4.1 g/dL (ref 3.5–4.8)
ALT: 6 IU/L (ref 0–32)
AST: 16 IU/L (ref 0–40)
Albumin/Globulin Ratio: 2.1 (ref 1.2–2.2)
Alkaline Phosphatase: 84 IU/L (ref 39–117)
BUN / CREAT RATIO: 12 (ref 12–28)
BUN: 16 mg/dL (ref 8–27)
Bilirubin Total: 0.4 mg/dL (ref 0.0–1.2)
CHLORIDE: 105 mmol/L (ref 96–106)
CO2: 23 mmol/L (ref 20–29)
CREATININE: 1.38 mg/dL — AB (ref 0.57–1.00)
Calcium: 9.4 mg/dL (ref 8.7–10.3)
GFR calc non Af Amer: 39 mL/min/{1.73_m2} — ABNORMAL LOW (ref >59)
GFR, EST AFRICAN AMERICAN: 45 mL/min/{1.73_m2} — AB (ref >59)
GLUCOSE: 191 mg/dL — AB (ref 65–99)
Globulin, Total: 2 g/dL (ref 1.5–4.5)
Potassium: 5.3 mmol/L — ABNORMAL HIGH (ref 3.5–5.2)
Sodium: 143 mmol/L (ref 134–144)
TOTAL PROTEIN: 6.1 g/dL (ref 6.0–8.5)

## 2017-05-11 LAB — HEMOGLOBIN A1C
Est. average glucose Bld gHb Est-mCnc: 140 mg/dL
HEMOGLOBIN A1C: 6.5 % — AB (ref 4.8–5.6)

## 2017-05-11 LAB — CBC WITH DIFFERENTIAL/PLATELET
BASOS ABS: 0 10*3/uL (ref 0.0–0.2)
Basos: 1 %
EOS (ABSOLUTE): 0.2 10*3/uL (ref 0.0–0.4)
Eos: 3 %
Hematocrit: 39.8 % (ref 34.0–46.6)
Hemoglobin: 13 g/dL (ref 11.1–15.9)
Immature Grans (Abs): 0 10*3/uL (ref 0.0–0.1)
Immature Granulocytes: 0 %
LYMPHS ABS: 2 10*3/uL (ref 0.7–3.1)
LYMPHS: 35 %
MCH: 32.3 pg (ref 26.6–33.0)
MCHC: 32.7 g/dL (ref 31.5–35.7)
MCV: 99 fL — ABNORMAL HIGH (ref 79–97)
Monocytes Absolute: 0.5 10*3/uL (ref 0.1–0.9)
Monocytes: 9 %
NEUTROS ABS: 3 10*3/uL (ref 1.4–7.0)
Neutrophils: 52 %
PLATELETS: 285 10*3/uL (ref 150–379)
RBC: 4.02 x10E6/uL (ref 3.77–5.28)
RDW: 13.7 % (ref 12.3–15.4)
WBC: 5.7 10*3/uL (ref 3.4–10.8)

## 2017-05-11 LAB — LIPID PANEL WITH LDL/HDL RATIO
CHOLESTEROL TOTAL: 161 mg/dL (ref 100–199)
HDL: 58 mg/dL (ref >39)
LDL CALC: 90 mg/dL (ref 0–99)
LDl/HDL Ratio: 1.6 ratio (ref 0.0–3.2)
Triglycerides: 66 mg/dL (ref 0–149)
VLDL CHOLESTEROL CAL: 13 mg/dL (ref 5–40)

## 2017-05-11 LAB — TSH: TSH: 3.15 u[IU]/mL (ref 0.450–4.500)

## 2017-05-11 LAB — HEPATITIS C ANTIBODY: Hep C Virus Ab: <0.1 s/co ratio (ref 0.0–0.9)

## 2017-05-15 ENCOUNTER — Telehealth: Payer: Self-pay

## 2017-05-15 MED ORDER — LOSARTAN POTASSIUM 25 MG PO TABS: 25.0000 mg | ORAL_TABLET | Freq: Every day | ORAL | 3 refills | Status: DC

## 2017-05-15 NOTE — Telephone Encounter (Signed)
-----   Message from Jerrol Banana., MD sent at 05/15/2017  9:01 AM EDT ----- Hep C normla. Early kidney disease--would start Losartan 25 mg dailt--RTC 3 months unless she feels bad.

## 2017-05-15 NOTE — Telephone Encounter (Signed)
Advised patient as below. Medication was sent into the pharmacy.  

## 2017-06-21 ENCOUNTER — Other Ambulatory Visit: Payer: Self-pay

## 2017-06-21 DIAGNOSIS — E162 Hypoglycemia, unspecified: Secondary | ICD-10-CM | POA: Diagnosis not present

## 2017-06-21 DIAGNOSIS — E161 Other hypoglycemia: Secondary | ICD-10-CM | POA: Diagnosis not present

## 2017-06-21 MED ORDER — LOSARTAN POTASSIUM 25 MG PO TABS: 25.0000 mg | ORAL_TABLET | Freq: Every day | ORAL | 3 refills | Status: DC

## 2017-07-16 ENCOUNTER — Other Ambulatory Visit: Payer: Self-pay | Admitting: Family Medicine

## 2017-07-16 NOTE — Telephone Encounter (Signed)
rx called in-Anastasiya V Hopkins, RMA  

## 2017-07-16 NOTE — Telephone Encounter (Signed)
Please review for dr gilbert-Aryka Coonradt V Laryah Neuser, RMA  

## 2017-07-19 DIAGNOSIS — Z23 Encounter for immunization: Secondary | ICD-10-CM | POA: Diagnosis not present

## 2017-07-23 DIAGNOSIS — E119 Type 2 diabetes mellitus without complications: Secondary | ICD-10-CM | POA: Diagnosis not present

## 2017-07-23 LAB — HM DIABETES EYE EXAM

## 2017-07-26 ENCOUNTER — Other Ambulatory Visit: Payer: Self-pay | Admitting: Family Medicine

## 2017-07-26 DIAGNOSIS — M79604 Pain in right leg: Secondary | ICD-10-CM

## 2017-07-26 DIAGNOSIS — M5431 Sciatica, right side: Secondary | ICD-10-CM

## 2017-08-14 ENCOUNTER — Other Ambulatory Visit: Payer: Self-pay | Admitting: Family Medicine

## 2017-08-14 DIAGNOSIS — Z1231 Encounter for screening mammogram for malignant neoplasm of breast: Secondary | ICD-10-CM

## 2017-08-15 ENCOUNTER — Other Ambulatory Visit: Payer: Self-pay | Admitting: Family Medicine

## 2017-08-15 ENCOUNTER — Other Ambulatory Visit: Payer: Self-pay | Admitting: Physician Assistant

## 2017-09-12 ENCOUNTER — Other Ambulatory Visit: Payer: Self-pay | Admitting: Family Medicine

## 2017-09-12 NOTE — Telephone Encounter (Signed)
Last RF 08/15/17 and last OV 04/17/17

## 2017-09-17 ENCOUNTER — Ambulatory Visit
Admission: RE | Admit: 2017-09-17 | Discharge: 2017-09-17 | Disposition: A | Discharge status: Home / Self Care | Payer: Medicare Other | Source: Ambulatory Visit | Attending: Family Medicine | Admitting: Family Medicine

## 2017-09-17 DIAGNOSIS — Z1231 Encounter for screening mammogram for malignant neoplasm of breast: Secondary | ICD-10-CM | POA: Insufficient documentation

## 2017-09-18 NOTE — Telephone Encounter (Signed)
RX called in at [facility] pharmacy  

## 2017-10-19 ENCOUNTER — Other Ambulatory Visit: Payer: Self-pay | Admitting: Family Medicine

## 2017-10-30 ENCOUNTER — Other Ambulatory Visit: Payer: Self-pay | Admitting: Family Medicine

## 2017-10-30 DIAGNOSIS — E109 Type 1 diabetes mellitus without complications: Secondary | ICD-10-CM

## 2017-12-23 ENCOUNTER — Other Ambulatory Visit: Payer: Self-pay | Admitting: Family Medicine

## 2018-01-03 ENCOUNTER — Other Ambulatory Visit: Payer: Self-pay

## 2018-01-03 ENCOUNTER — Ambulatory Visit (INDEPENDENT_AMBULATORY_CARE_PROVIDER_SITE_OTHER): Payer: Medicare Other | Admitting: Family Medicine

## 2018-01-03 VITALS — BP 138/70 | HR 74 | Temp 97.6°F | Resp 16 | Wt 131.0 lb

## 2018-01-03 DIAGNOSIS — E782 Mixed hyperlipidemia: Secondary | ICD-10-CM | POA: Diagnosis not present

## 2018-01-03 DIAGNOSIS — I10 Essential (primary) hypertension: Secondary | ICD-10-CM

## 2018-01-03 DIAGNOSIS — E109 Type 1 diabetes mellitus without complications: Secondary | ICD-10-CM | POA: Diagnosis not present

## 2018-01-03 NOTE — Progress Notes (Signed)
Debbie Dennis  MRN: [id number] DOB: 1946-01-01  Subjective:  HPI   The patient is a 72 year old Type I Insulin dependent diabetic.  She presents today for face to face evaluation in order to obtain her diabetic supplies.Her last A1C was on 05/10/17 and was 6.5.  Her last visit was on 04/17/17 and she had her micro albumin checked at that time along with her other routine labs. Diabetic foot exam was done on7/17/18.  She had her last diabetic eye exam on 07/23/17. The patient states she checks her glucose 10-12 times per day.   She further states that her glucose level is very brittle.  She reports that she has drastic changes in her readings dependent on what she is doing during the day.  Patient Active Problem List   Diagnosis Date Noted  . Anxiety 02/05/2015  . Allergic rhinitis 02/05/2015  . Arthritis 02/05/2015  . Acute exacerbation of chronic obstructive airways disease ([place]) 02/05/2015  . 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 Insulin dependent type 1 diabetes mellitus ([place]) 02/05/2015  . Bloodgood disease 02/05/2015  . Acid reflux 02/05/2015  . HLD (hyperlipidemia) 02/05/2015  . BP (high blood pressure) 02/05/2015  . Adult hypothyroidism 02/05/2015  . Adaptive colitis 02/05/2015  . Osteopenia 02/05/2015  . Restless leg 02/05/2015  . History of tobacco abuse 02/05/2015  . Venous stasis syndrome 02/05/2015    Past Medical History:  Diagnosis Date  . Adaptive colitis   . Anxiety   . Arthritis   . Bronchitis    CHRONIC  . Buedinger-Ludloff-Laewen disease   . Diabetes mellitus without complication ([place])   . GERD (gastroesophageal reflux disease)   . Headache    MIGRAINES  . Hypertension   . Hypothyroidism   . Restless leg syndrome     Social History   Socioeconomic History  . Marital status: Married    Spouse name: Sonia Side  . Number of children: 1  . Years of education: 46  . Highest education level: Not on file  Occupational History  . Occupation:  retired  Scientific laboratory technician  . Financial resource strain: Not on file  . Food insecurity:    Worry: Not on file    Inability: Not on file  . Transportation needs:    Medical: Not on file    Non-medical: Not on file  Tobacco Use  . Smoking status: Former Smoker    Packs/day: 1.00    Years: 35.00    Pack years: 35.00    Types: Cigarettes    Last attempt to quit: 10/02/2012    Years since quitting: 5.2  . Smokeless tobacco: Never Used  Substance and Sexual Activity  . Alcohol use: No    Comment: rarely  . Drug use: No  . Sexual activity: Not Currently    Birth control/protection: None  Lifestyle  . Physical activity:    Days per week: Not on file    Minutes per session: Not on file  . Stress: Not on file  Relationships  . Social connections:    Talks on phone: Not on file    Gets together: Not on file    Attends religious service: Not on file    Active member of club or organization: Not on file    Attends meetings of clubs or organizations: Not on file    Relationship status: Not on file  . Intimate partner violence:    Fear of current or ex partner: Not on file    Emotionally abused:  Not on file    Physically abused: Not on file    Forced sexual activity: Not on file  Other Topics Concern  . Not on file  Social History Narrative  . Not on file    Outpatient Encounter Medications as of 01/03/2018  Medication Sig  . ALPRAZolam (XANAX) 0.5 MG tablet TAKE 1 TO 2 TABLETS BY MOUTH ONCE DAILY AS NEEDED  . fluticasone (FLONASE) 50 MCG/ACT nasal spray Place into both nostrils daily.  Marland Kitchen gabapentin (NEURONTIN) 100 MG capsule TAKE ONE CAPSULE BY MOUTH THREE TIMES DAILY  . glucose blood test strip Checks sugar 6 times daily -Needs ReliOn premier blue strips-DX:E11.9  . insulin aspart (NOVOLOG) 100 UNIT/ML injection Per sliding scale: up to 60 units daily. DX E11.9  . LANTUS 100 UNIT/ML injection INJECT 10 UNITS SUBCUTANEOUSLY AT BEDTIME  . levothyroxine (SYNTHROID, LEVOTHROID) 50 MCG  tablet TAKE ONE TABLET BY MOUTH ONCE DAILY  . loratadine (CLARITIN) 10 MG tablet TAKE ONE TABLET BY MOUTH ONCE DAILY  . losartan (COZAAR) 25 MG tablet Take 1 tablet (25 mg total) by mouth daily.  Marland Kitchen omeprazole (PRILOSEC) 20 MG capsule TAKE ONE CAPSULE BY MOUTH ONCE DAILY (Patient taking differently: TAKE ONE CAPSULE BY MOUTH ONCE DAILY as needed)  . ranitidine (ZANTAC) 150 MG tablet TAKE ONE TABLET BY MOUTH TWICE DAILY (Patient taking differently: TAKE ONE TABLET BY MOUTH TWICE DAILY as needed)  . topiramate (TOPAMAX) 50 MG tablet TAKE TWO TABLETS BY MOUTH ONCE DAILY   No facility-administered encounter medications on file as of 01/03/2018.     Allergies  Allergen Reactions  . Codeine Nausea And Vomiting  . Sulfa Antibiotics Rash    Review of Systems  Constitutional: Negative for fever and malaise/fatigue.  Eyes: Negative.   Respiratory: Negative for cough, shortness of breath and wheezing.   Cardiovascular: Negative for chest pain, palpitations, orthopnea, claudication and leg swelling.  Gastrointestinal: Negative.   Genitourinary: Negative.   Skin: Negative.   Neurological: Negative for dizziness, tingling, weakness and headaches.  Endo/Heme/Allergies: Negative.   Psychiatric/Behavioral: Negative.     Objective:  BP 138/70 (BP Location: Right Arm, Patient Position: Sitting, Cuff Size: Normal)   Pulse 74   Temp 97.6 F (36.4 C) (Oral)   Resp 16   Wt 131 lb (59.4 kg)   BMI 24.75 kg/m   Physical Exam  Constitutional: She is oriented to person, place, and time and well-developed, well-nourished, and in no distress.  HENT:  Head: Normocephalic and atraumatic.  Eyes: Conjunctivae are normal. No scleral icterus.  Neck: No thyromegaly present.  Cardiovascular: Normal rate, regular rhythm and normal heart sounds.  Pulmonary/Chest: Effort normal and breath sounds normal.  Abdominal: Soft.  Neurological: She is alert and oriented to person, place, and time. Gait normal. GCS score  is 15.  Skin: Skin is warm and dry.  Psychiatric: Mood, memory, affect and judgment normal.    Assessment and Plan :  TIDM Pt noncompliant with followup /recommendations. She need to see Endocrinology as she is having more frequent hypoglycemia.She says she has brittle DM  And checks her BS 10-12 times per day. She needs to see endocrinlogy. A1C today6.2. HTN HLD  I have done the exam and reviewed the chart and it is accurate to the best of my knowledge. Development worker, community has been used and  any errors in dictation or transcription are unintentional. Miguel Aschoff M.D. [facility]

## 2018-01-03 NOTE — Progress Notes (Deleted)
Debbie Dennis  MRN: [id number] DOB: Mar 21, 1946  Subjective:  HPI   The patient is a 72 year old Type I Insulin dependent diabetic.  She presents today for face to face evaluation in order to obtain her diabetic supplies.Her last A1C was on 05/10/17 and was 6.5.  Her last visit was on 04/17/17 and she had her micro albumin checked at that time along with her other routine labs. Diabetic foot exam was done on7/17/18.  She had her last diabetic eye exam on 07/23/17. The patient states she checks her glucose 10-12 times per day.   She further states that her glucose level is very brittle.  She reports that she has drastic changes in her readings dependent on what she is doing during the day.  Patient Active Problem List   Diagnosis Date Noted  . Anxiety 02/05/2015  . Allergic rhinitis 02/05/2015  . Arthritis 02/05/2015  . Acute exacerbation of chronic obstructive airways disease ([place]) 02/05/2015  . 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 Insulin dependent type 1 diabetes mellitus ([place]) 02/05/2015  . Bloodgood disease 02/05/2015  . Acid reflux 02/05/2015  . HLD (hyperlipidemia) 02/05/2015  . BP (high blood pressure) 02/05/2015  . Adult hypothyroidism 02/05/2015  . Adaptive colitis 02/05/2015  . Osteopenia 02/05/2015  . Restless leg 02/05/2015  . History of tobacco abuse 02/05/2015  . Venous stasis syndrome 02/05/2015    Past Medical History:  Diagnosis Date  . Adaptive colitis   . Anxiety   . Arthritis   . Bronchitis    CHRONIC  . Buedinger-Ludloff-Laewen disease   . Diabetes mellitus without complication ([place])   . GERD (gastroesophageal reflux disease)   . Headache    MIGRAINES  . Hypertension   . Hypothyroidism   . Restless leg syndrome     Social History   Socioeconomic History  . Marital status: Married    Spouse name: Sonia Side  . Number of children: 1  . Years of education: 72  . Highest education level: Not on file  Occupational History  . Occupation:  retired  Scientific laboratory technician  . Financial resource strain: Not on file  . Food insecurity:    Worry: Not on file    Inability: Not on file  . Transportation needs:    Medical: Not on file    Non-medical: Not on file  Tobacco Use  . Smoking status: Former Smoker    Packs/day: 1.00    Years: 35.00    Pack years: 35.00    Types: Cigarettes    Last attempt to quit: 10/02/2012    Years since quitting: 5.2  . Smokeless tobacco: Never Used  Substance and Sexual Activity  . Alcohol use: No    Comment: rarely  . Drug use: No  . Sexual activity: Not Currently    Birth control/protection: None  Lifestyle  . Physical activity:    Days per week: Not on file    Minutes per session: Not on file  . Stress: Not on file  Relationships  . Social connections:    Talks on phone: Not on file    Gets together: Not on file    Attends religious service: Not on file    Active member of club or organization: Not on file    Attends meetings of clubs or organizations: Not on file    Relationship status: Not on file  . Intimate partner violence:    Fear of current or ex partner: Not on file    Emotionally abused:  Not on file    Physically abused: Not on file    Forced sexual activity: Not on file  Other Topics Concern  . Not on file  Social History Narrative  . Not on file    Outpatient Encounter Medications as of 01/03/2018  Medication Sig  . ALPRAZolam (XANAX) 0.5 MG tablet TAKE 1 TO 2 TABLETS BY MOUTH ONCE DAILY AS NEEDED  . fluticasone (FLONASE) 50 MCG/ACT nasal spray Place into both nostrils daily.  Marland Kitchen gabapentin (NEURONTIN) 100 MG capsule TAKE ONE CAPSULE BY MOUTH THREE TIMES DAILY  . glucose blood test strip Checks sugar 6 times daily -Needs ReliOn premier blue strips-DX:E11.9  . insulin aspart (NOVOLOG) 100 UNIT/ML injection Per sliding scale: up to 60 units daily. DX E11.9  . LANTUS 100 UNIT/ML injection INJECT 10 UNITS SUBCUTANEOUSLY AT BEDTIME  . levothyroxine (SYNTHROID, LEVOTHROID) 50 MCG  tablet TAKE ONE TABLET BY MOUTH ONCE DAILY  . loratadine (CLARITIN) 10 MG tablet TAKE ONE TABLET BY MOUTH ONCE DAILY  . losartan (COZAAR) 25 MG tablet Take 1 tablet (25 mg total) by mouth daily.  Marland Kitchen omeprazole (PRILOSEC) 20 MG capsule TAKE ONE CAPSULE BY MOUTH ONCE DAILY (Patient taking differently: TAKE ONE CAPSULE BY MOUTH ONCE DAILY as needed)  . ranitidine (ZANTAC) 150 MG tablet TAKE ONE TABLET BY MOUTH TWICE DAILY (Patient taking differently: TAKE ONE TABLET BY MOUTH TWICE DAILY as needed)  . topiramate (TOPAMAX) 50 MG tablet TAKE TWO TABLETS BY MOUTH ONCE DAILY   No facility-administered encounter medications on file as of 01/03/2018.     Allergies  Allergen Reactions  . Codeine Nausea And Vomiting  . Sulfa Antibiotics Rash    ROS  Objective:  BP 138/70 (BP Location: Right Arm, Patient Position: Sitting, Cuff Size: Normal)   Pulse 74   Temp 97.6 F (36.4 C) (Oral)   Resp 16   Wt 131 lb (59.4 kg)   BMI 24.75 kg/m   Physical Exam  Assessment and Plan :  No diagnosis found.

## 2018-01-03 NOTE — Progress Notes (Deleted)
Debbie Dennis  MRN: [id number] DOB: 1945-11-19  Subjective:  HPI   The patient is a 72 year old Type I Insulin dependent diabetic.  She presents today for face to face evaluation in order to obtain her diabetic supplies.Her last A1C was on 05/10/17 and was 6.5.  Her last visit was on 04/17/17 and she had her micro albumin checked at that time along with her other routine labs. Diabetic foot exam was done on7/17/18.  She had her last diabetic eye exam on 07/23/17. The patient states she checks her glucose 10-12 times per day.   She further states that her glucose level is very brittle.  She reports that she has drastic changes in her readings dependent on what she is doing during the day.   Patient Active Problem List   Diagnosis Date Noted  . Anxiety 02/05/2015  . Allergic rhinitis 02/05/2015  . Arthritis 02/05/2015  . Acute exacerbation of chronic obstructive airways disease ([place]) 02/05/2015  . 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 Insulin dependent type 1 diabetes mellitus ([place]) 02/05/2015  . Bloodgood disease 02/05/2015  . Acid reflux 02/05/2015  . HLD (hyperlipidemia) 02/05/2015  . BP (high blood pressure) 02/05/2015  . Adult hypothyroidism 02/05/2015  . Adaptive colitis 02/05/2015  . Osteopenia 02/05/2015  . Restless leg 02/05/2015  . History of tobacco abuse 02/05/2015  . Venous stasis syndrome 02/05/2015    Past Medical History:  Diagnosis Date  . Adaptive colitis   . Anxiety   . Arthritis   . Bronchitis    CHRONIC  . Buedinger-Ludloff-Laewen disease   . Diabetes mellitus without complication ([place])   . GERD (gastroesophageal reflux disease)   . Headache    MIGRAINES  . Hypertension   . Hypothyroidism   . Restless leg syndrome     Social History   Socioeconomic History  . Marital status: Married    Spouse name: Sonia Side  . Number of children: 1  . Years of education: 52  . Highest education level: Not on file  Occupational History  . Occupation:  retired  Scientific laboratory technician  . Financial resource strain: Not on file  . Food insecurity:    Worry: Not on file    Inability: Not on file  . Transportation needs:    Medical: Not on file    Non-medical: Not on file  Tobacco Use  . Smoking status: Former Smoker    Packs/day: 1.00    Years: 35.00    Pack years: 35.00    Types: Cigarettes    Last attempt to quit: 10/02/2012    Years since quitting: 5.2  . Smokeless tobacco: Never Used  Substance and Sexual Activity  . Alcohol use: No    Comment: rarely  . Drug use: No  . Sexual activity: Not Currently    Birth control/protection: None  Lifestyle  . Physical activity:    Days per week: Not on file    Minutes per session: Not on file  . Stress: Not on file  Relationships  . Social connections:    Talks on phone: Not on file    Gets together: Not on file    Attends religious service: Not on file    Active member of club or organization: Not on file    Attends meetings of clubs or organizations: Not on file    Relationship status: Not on file  . Intimate partner violence:    Fear of current or ex partner: Not on file    Emotionally  abused: Not on file    Physically abused: Not on file    Forced sexual activity: Not on file  Other Topics Concern  . Not on file  Social History Narrative  . Not on file    Outpatient Encounter Medications as of 01/03/2018  Medication Sig  . ALPRAZolam (XANAX) 0.5 MG tablet TAKE 1 TO 2 TABLETS BY MOUTH ONCE DAILY AS NEEDED  . fluticasone (FLONASE) 50 MCG/ACT nasal spray Place into both nostrils daily.  Marland Kitchen gabapentin (NEURONTIN) 100 MG capsule TAKE ONE CAPSULE BY MOUTH THREE TIMES DAILY  . glucose blood test strip Checks sugar 6 times daily -Needs ReliOn premier blue strips-DX:E11.9  . insulin aspart (NOVOLOG) 100 UNIT/ML injection Per sliding scale: up to 60 units daily. DX E11.9  . LANTUS 100 UNIT/ML injection INJECT 10 UNITS SUBCUTANEOUSLY AT BEDTIME  . levothyroxine (SYNTHROID, LEVOTHROID) 50 MCG  tablet TAKE ONE TABLET BY MOUTH ONCE DAILY  . loratadine (CLARITIN) 10 MG tablet TAKE ONE TABLET BY MOUTH ONCE DAILY  . losartan (COZAAR) 25 MG tablet Take 1 tablet (25 mg total) by mouth daily.  Marland Kitchen omeprazole (PRILOSEC) 20 MG capsule TAKE ONE CAPSULE BY MOUTH ONCE DAILY (Patient taking differently: TAKE ONE CAPSULE BY MOUTH ONCE DAILY as needed)  . ranitidine (ZANTAC) 150 MG tablet TAKE ONE TABLET BY MOUTH TWICE DAILY (Patient taking differently: TAKE ONE TABLET BY MOUTH TWICE DAILY as needed)  . topiramate (TOPAMAX) 50 MG tablet TAKE TWO TABLETS BY MOUTH ONCE DAILY   No facility-administered encounter medications on file as of 01/03/2018.     Allergies  Allergen Reactions  . Codeine Nausea And Vomiting  . Sulfa Antibiotics Rash    Review of Systems  Constitutional: Negative for fever and weight loss.  Respiratory: Negative for cough, shortness of breath and wheezing.   Cardiovascular: Negative for chest pain, palpitations, orthopnea, claudication and leg swelling.  Neurological: Negative for dizziness, tingling, tremors, weakness and headaches.    Objective:  There were no vitals taken for this visit.  Physical Exam  Assessment and Plan :  No diagnosis found.

## 2018-01-15 ENCOUNTER — Other Ambulatory Visit: Payer: Self-pay | Admitting: Family Medicine

## 2018-01-22 ENCOUNTER — Other Ambulatory Visit: Payer: Self-pay

## 2018-01-22 DIAGNOSIS — E1065 Type 1 diabetes mellitus with hyperglycemia: Secondary | ICD-10-CM

## 2018-01-22 MED ORDER — GLUCOSE BLOOD VI STRP: ORAL_STRIP | 12 refills | Status: DC

## 2018-01-25 ENCOUNTER — Other Ambulatory Visit: Payer: Self-pay | Admitting: Family Medicine

## 2018-03-01 ENCOUNTER — Other Ambulatory Visit: Payer: Self-pay | Admitting: Family Medicine

## 2018-03-19 ENCOUNTER — Telehealth: Payer: Self-pay

## 2018-03-19 NOTE — Telephone Encounter (Signed)
Unfortunately I did not see a time in July that would work.  She should be ok to wait until August

## 2018-03-19 NOTE — Telephone Encounter (Signed)
Called pt and scheduled CPE for the next available physical slot on 06/18/18 @ 940 AM. -MM

## 2018-03-19 NOTE — Telephone Encounter (Signed)
Called pt to schedule AWV for 2020. Noticed CPE was scheduled on 04/16/18. Pt had her awv and cpe on 04/16/17 last year and has traditional medicare. Pt has to wait at least 366 days to schedule next awv and cpe. Tried to find another CPE slot and was unable to until after August. Since this was scheduled in error, is there anyway to add pt in on another day in July? AWV is scheduled for 04/18/18. Pt is ok with having apts completed on separate days. Original CPE that was scheduled for 04/16/18 has not been cancelled yet. Please advise, thanks. -MM

## 2018-04-10 ENCOUNTER — Other Ambulatory Visit: Payer: Self-pay

## 2018-04-10 ENCOUNTER — Other Ambulatory Visit: Payer: Self-pay | Admitting: Family Medicine

## 2018-04-10 NOTE — Telephone Encounter (Signed)
Patients husband Sonia Side called office requesting a refill on patient Novolog, her husband states that patient is almost out and would like to pick this up from pharmacy tonight or in morning. He request a nurse give him a call back when prescription has been sent. KW

## 2018-04-14 MED ORDER — INSULIN ASPART 100 UNIT/ML ~~LOC~~ SOLN: SUBCUTANEOUS | 12 refills | Status: DC

## 2018-04-16 ENCOUNTER — Encounter: Payer: Self-pay | Admitting: Family Medicine

## 2018-04-18 ENCOUNTER — Ambulatory Visit (INDEPENDENT_AMBULATORY_CARE_PROVIDER_SITE_OTHER): Payer: Medicare Other

## 2018-04-18 VITALS — BP 122/56 | HR 80 | Temp 98.8°F | Ht 61.0 in | Wt 128.0 lb

## 2018-04-18 DIAGNOSIS — Z0000 Encounter for general adult medical examination without abnormal findings: Secondary | ICD-10-CM | POA: Diagnosis not present

## 2018-04-18 NOTE — Progress Notes (Signed)
Subjective:   Debbie Dennis is a 72 y.o. female who presents for Medicare Annual (Subsequent) preventive examination.  Review of Systems:  N/A  Cardiac Risk Factors include: advanced age (>41men, >31 women);diabetes mellitus;dyslipidemia     Objective:     Vitals: BP (!) 122/56 (BP Location: Right Arm)   Pulse 80   Temp 98.8 F (37.1 C) (Oral)   Ht 5\' 1"  (1.549 m)   Wt 128 lb (58.1 kg)   BMI 24.19 kg/m   Body mass index is 24.19 kg/m.  Advanced Directives 04/18/2018 04/17/2017 11/28/2016 03/23/2016 01/20/2016 11/22/2015 05/14/2015  Does Patient Have a Medical Advance Directive? No No No No No No No  Would patient like information on creating a medical advance directive? No - Patient declined No - Patient declined No - Patient declined - - - -    Tobacco Social History   Tobacco Use  Smoking Status Former Smoker  . Packs/day: 1.00  . Years: 35.00  . Pack years: 35.00  . Types: Cigarettes  . Last attempt to quit: 10/02/2012  . Years since quitting: 5.5  Smokeless Tobacco Never Used     Counseling given: Not Answered   Clinical Intake:  Pre-visit preparation completed: Yes  Pain : No/denies pain Pain Score: 0-No pain     Nutritional Status: BMI of 19-24  Normal Nutritional Risks: None Diabetes: Yes(type 1) CBG done?: No Did pt. bring in CBG monitor from home?: No  How often do you need to have someone help you when you read instructions, pamphlets, or other written materials from your doctor or pharmacy?: 1 - Never  Interpreter Needed?: No  Information entered by :: [facility], LPN  Past Medical History:  Diagnosis Date  . Adaptive colitis   . Anxiety   . Arthritis   . Bronchitis    CHRONIC  . Buedinger-Ludloff-Laewen disease   . Diabetes mellitus without complication ([place])   . GERD (gastroesophageal reflux disease)   . Headache    MIGRAINES  . Hypertension   . Hypothyroidism   . Restless leg syndrome    Past Surgical History:  Procedure  Laterality Date  . ABDOMINAL HYSTERECTOMY     has ovaries-due to abnormal pap smears  . BREAST EXCISIONAL BIOPSY Left 1988  . BREAST SURGERY     lumpectomy  . CATARACT EXTRACTION W/PHACO Left 01/20/2016   Procedure: CATARACT EXTRACTION PHACO AND INTRAOCULAR LENS PLACEMENT (IOC);  Surgeon: Leandrew Koyanagi, MD;  Location: [facility] ORS;  Service: Ophthalmology;  Laterality: Left;  Lot # [id number] H US:02:01:5 AP:20.2% CDE: 24.51  . CATARACT EXTRACTION W/PHACO Right 11/28/2016   Procedure: CATARACT EXTRACTION PHACO AND INTRAOCULAR LENS PLACEMENT (IOC);  Surgeon: Leandrew Koyanagi, MD;  Location: [facility] ORS;  Service: Ophthalmology;  Laterality: Right;  [place] 01:01 AP% 18.3 CDE 11.26 Fluid pack lot # [id number]  . COLONOSCOPY WITH PROPOFOL N/A 05/14/2015   Procedure: COLONOSCOPY WITH PROPOFOL;  Surgeon: Josefine Class, MD;  Location: [facility] ENDOSCOPY;  Service: Endoscopy;  Laterality: N/A;  . EYE SURGERY     cataract left   Family History  Problem Relation Age of Onset  . Cancer Mother        spread into her lungs-died from heart issues   . Heart attack Mother   . COPD Father   . Throat cancer Father   . Heart disease Father   . Arthritis Sister   . Breast cancer Sister 62  . Lung cancer Brother   . Asthma Maternal Grandfather    Social  History   Socioeconomic History  . Marital status: Married    Spouse name: Sonia Side  . Number of children: 1  . Years of education: 39  . Highest education level: 12th grade  Occupational History  . Occupation: retired  Scientific laboratory technician  . Financial resource strain: Not hard at all  . Food insecurity:    Worry: Never true    Inability: Never true  . Transportation needs:    Medical: No    Non-medical: No  Tobacco Use  . Smoking status: Former Smoker    Packs/day: 1.00    Years: 35.00    Pack years: 35.00    Types: Cigarettes    Last attempt to quit: 10/02/2012    Years since quitting: 5.5  . Smokeless tobacco: Never Used  Substance and Sexual  Activity  . Alcohol use: No  . Drug use: No  . Sexual activity: Not Currently    Birth control/protection: None  Lifestyle  . Physical activity:    Days per week: Not on file    Minutes per session: Not on file  . Stress: Rather much  Relationships  . Social connections:    Talks on phone: Not on file    Gets together: Not on file    Attends religious service: Not on file    Active member of club or organization: Not on file    Attends meetings of clubs or organizations: Not on file    Relationship status: Not on file  Other Topics Concern  . Not on file  Social History Narrative  . Not on file    Outpatient Encounter Medications as of 04/18/2018  Medication Sig  . ALPRAZolam (XANAX) 0.5 MG tablet TAKE 1 TO 2 TABLETS BY MOUTH ONCE DAILY AS NEEDED  . fluticasone (FLONASE) 50 MCG/ACT nasal spray Place into both nostrils daily.   Marland Kitchen gabapentin (NEURONTIN) 100 MG capsule TAKE ONE CAPSULE BY MOUTH THREE TIMES DAILY  . glucose blood (RELION GLUCOSE TEST STRIPS) test strip USE TO CHECK SUGAR SIX TIMES DAILY  . insulin aspart (NOVOLOG) 100 UNIT/ML injection Per sliding scale: up to 60 units daily. DX E11.9  . LANTUS 100 UNIT/ML injection INJECT 10 UNITS SUBCUTANEOUSLY AT BEDTIME  . levothyroxine (SYNTHROID, LEVOTHROID) 50 MCG tablet TAKE ONE TABLET BY MOUTH ONCE DAILY  . loratadine (CLARITIN) 10 MG tablet TAKE ONE TABLET BY MOUTH ONCE DAILY  . losartan (COZAAR) 25 MG tablet Take 1 tablet (25 mg total) by mouth daily.  Marland Kitchen omeprazole (PRILOSEC) 20 MG capsule TAKE ONE CAPSULE BY MOUTH ONCE DAILY (Patient taking differently: TAKE ONE CAPSULE BY MOUTH ONCE DAILY as needed)  . ranitidine (ZANTAC) 150 MG tablet TAKE ONE TABLET BY MOUTH TWICE DAILY (Patient taking differently: TAKE ONE TABLET BY MOUTH TWICE DAILY as needed)  . topiramate (TOPAMAX) 50 MG tablet TAKE TWO TABLETS BY MOUTH ONCE DAILY  . insulin aspart (NOVOLOG) 100 UNIT/ML injection INJECT UP TO 60 UNITS SUBCUTANEOUSLY DAILY PER   SLIDING  SCALE (Patient not taking: Reported on 04/18/2018)   No facility-administered encounter medications on file as of 04/18/2018.     Activities of Daily Living In your present state of health, do you have any difficulty performing the following activities: 04/18/2018  Hearing? N  Vision? N  Difficulty concentrating or making decisions? N  Walking or climbing stairs? N  Dressing or bathing? N  Doing errands, shopping? N  Preparing Food and eating ? N  Using the Toilet? N  In the past six months,  have you accidently leaked urine? N  Do you have problems with loss of bowel control? N  Managing your Medications? N  Managing your Finances? N  Housekeeping or managing your Housekeeping? N  Some recent data might be hidden    Patient Care Team: Jerrol Banana., MD as PCP - General (Family Medicine) Leandrew Koyanagi, MD as Referring Physician (Ophthalmology)    Assessment:   This is a routine wellness examination for [place].  Exercise Activities and Dietary recommendations Current Exercise Habits: Home exercise routine, Type of exercise: Other - see comments(stationary bike), Time (Minutes): 35, Frequency (Times/Week): 2, Weekly Exercise (Minutes/Week): 70, Intensity: Mild, Exercise limited by: None identified  Goals    . DIET - INCREASE WATER INTAKE     Recommend increasing water intake to 4 glasses a day.        Fall Risk Fall Risk  04/18/2018 04/17/2017 07/24/2016 03/23/2016 03/23/2015  Falls in the past year? No Yes Yes No No  Number falls in past yr: - 1 1 - -  Injury with Fall? - Yes No - -  Comment - sprained feet and ankle - - -  Follow up - Falls prevention discussed - - -   Is the patient's home free of loose throw rugs in walkways, pet beds, electrical cords, etc?   yes      Grab bars in the bathroom? no      Handrails on the stairs?   yes      Adequate lighting?   yes  Timed Get Up and Go performed: N/A  Depression Screen PHQ 2/9 Scores 04/18/2018  04/17/2017 04/17/2017 03/23/2016  PHQ - 2 Score 0 0 0 2  PHQ- 9 Score - 2 - 8     Cognitive Function: Pt declined screening today.      6CIT Screen 04/17/2017  What Year? 0 points  What month? 0 points  What time? 0 points  Count back from 20 0 points  Months in reverse 0 points  Repeat phrase 4 points  Total Score 4    Immunization History  Administered Date(s) Administered  . Influenza, High Dose Seasonal PF 07/22/2015, 07/24/2016, 07/19/2017  . Pneumococcal Conjugate-13 08/31/2014  . Pneumococcal Polysaccharide-23 07/11/2007, 01/16/2013  . Tdap 08/25/2008  . Zoster 03/02/2012    Qualifies for Shingles Vaccine? Due for Shingles vaccine. Declined my offer to administer today. Education has been provided regarding the importance of this vaccine. Pt has been advised to call her insurance company to determine her out of pocket expense. Advised she may also receive this vaccine at her local pharmacy or Health Dept. Verbalized acceptance and understanding.  Screening Tests Health Maintenance  Topic Date Due  . HEMOGLOBIN A1C  11/10/2017  . FOOT EXAM  04/17/2018  . INFLUENZA VACCINE  05/02/2018  . OPHTHALMOLOGY EXAM  07/23/2018  . TETANUS/TDAP  08/25/2018  . MAMMOGRAM  09/18/2019  . COLONOSCOPY  05/13/2025  . DEXA SCAN  Completed  . Hepatitis C Screening  Completed  . PNA vac Low Risk Adult  Completed    Cancer Screenings: Lung: Low Dose CT Chest recommended if Age 63-80 years, 30 pack-year currently smoking OR have quit w/in 15years. Patient does qualify. An Epic message has been sent to Burgess Estelle, RN (Oncology Nurse Navigator) regarding the possible need for this exam. Raquel Sarna will review the patient's chart to determine if the patient truly qualifies for the exam. If the patient qualifies, Raquel Sarna will order the Low Dose CT of the chest to  facilitate the scheduling of this exam. Breast:  Up to date on Mammogram? Yes   Up to date of Bone Density/Dexa? Yes Colorectal: Up to  date  Additional Screenings:  Hepatitis C Screening: Up to date     Plan:  I have personally reviewed and addressed the Medicare Annual Wellness questionnaire and have noted the following in the patient's chart:  A. Medical and social history B. Use of alcohol, tobacco or illicit drugs  C. Current medications and supplements D. Functional ability and status E.  Nutritional status F.  Physical activity G. Advance directives H. List of other physicians I.  Hospitalizations, surgeries, and ER visits in previous 12 months J.  [place] such as hearing and vision if needed, cognitive and depression L. Referrals and appointments - none  In addition, I have reviewed and discussed with patient certain preventive protocols, quality metrics, and best practice recommendations. A written personalized care plan for preventive services as well as general preventive health recommendations were provided to patient.  See attached scanned questionnaire for additional information.   Signed,  Fabio Neighbors, LPN Nurse Health Advisor   Nurse Recommendations: Pt needs her Hgb A1c checked at next OV. Low dose chest CT scan referral sent to [facility] to set up apt and contact pt.

## 2018-04-18 NOTE — Patient Instructions (Addendum)
Debbie Dennis , Thank you for taking time to come for your Medicare Wellness Visit. I appreciate your ongoing commitment to your health goals. Please review the following plan we discussed and let me know if I can assist you in the future.   Screening recommendations/referrals: Colonoscopy: Up to date Mammogram: Up to date Bone Density: Up to date Recommended yearly ophthalmology/optometry visit for glaucoma screening and checkup Recommended yearly dental visit for hygiene and checkup  Vaccinations: Influenza vaccine: Up to date Pneumococcal vaccine: Up to date Tdap vaccine: Up to date Shingles vaccine: Pt declines today.     Advanced directives: Advance directive discussed with you today. Even though you declined this today please call our office should you change your mind and we can give you the proper paperwork for you to fill out.  Conditions/risks identified: Recommend increasing water intake to 4 glasses a day.   Next appointment: 07/24/18 @ 2 PM with Dr Rosanna Randy.    Preventive Care 46 Years and Older, Female Preventive care refers to lifestyle choices and visits with your health care provider that can promote health and wellness. What does preventive care include?  A yearly physical exam. This is also called an annual well check.  Dental exams once or twice a year.  Routine eye exams. Ask your health care provider how often you should have your eyes checked.  Personal lifestyle choices, including:  Daily care of your teeth and gums.  Regular physical activity.  Eating a healthy diet.  Avoiding tobacco and drug use.  Limiting alcohol use.  Practicing safe sex.  Taking low-dose aspirin every day.  Taking vitamin and mineral supplements as recommended by your health care provider. What happens during an annual well check? The services and screenings done by your health care provider during your annual well check will depend on your age, overall health, lifestyle risk  factors, and family history of disease. Counseling  Your health care provider may ask you questions about your:  Alcohol use.  Tobacco use.  Drug use.  Emotional well-being.  Home and relationship well-being.  Sexual activity.  Eating habits.  History of falls.  Memory and ability to understand (cognition).  Work and work Statistician.  Reproductive health. Screening  You may have the following tests or measurements:  Height, weight, and BMI.  Blood pressure.  Lipid and cholesterol levels. These may be checked every 5 years, or more frequently if you are over 72 years old.  Skin check.  Lung cancer screening. You may have this screening every year starting at age 11 if you have a 30-pack-year history of smoking and currently smoke or have quit within the past 15 years.  Fecal occult blood test (FOBT) of the stool. You may have this test every year starting at age 64.  Flexible sigmoidoscopy or colonoscopy. You may have a sigmoidoscopy every 5 years or a colonoscopy every 10 years starting at age 16.  Hepatitis C blood test.  Hepatitis B blood test.  Sexually transmitted disease (STD) testing.  Diabetes screening. This is done by checking your blood sugar (glucose) after you have not eaten for a while (fasting). You may have this done every 1-3 years.  Bone density scan. This is done to screen for osteoporosis. You may have this done starting at age 74.  Mammogram. This may be done every 1-2 years. Talk to your health care provider about how often you should have regular mammograms. Talk with your health care provider about your test results, treatment  options, and if necessary, the need for more tests. Vaccines  Your health care provider may recommend certain vaccines, such as:  Influenza vaccine. This is recommended every year.  Tetanus, diphtheria, and acellular pertussis (Tdap, Td) vaccine. You may need a Td booster every 10 years.  Zoster vaccine. You  may need this after age 12.  Pneumococcal 13-valent conjugate (PCV13) vaccine. One dose is recommended after age 70.  Pneumococcal polysaccharide (PPSV23) vaccine. One dose is recommended after age 43. Talk to your health care provider about which screenings and vaccines you need and how often you need them. This information is not intended to replace advice given to you by your health care provider. Make sure you discuss any questions you have with your health care provider. Document Released: 10/15/2015 Document Revised: 06/07/2016 Document Reviewed: 07/20/2015 Elsevier Interactive Patient Education  2017 [place] Prevention in the Home Falls can cause injuries. They can happen to people of all ages. There are many things you can do to make your home safe and to help prevent falls. What can I do on the outside of my home?  Regularly fix the edges of walkways and driveways and fix any cracks.  Remove anything that might make you trip as you walk through a door, such as a raised step or threshold.  Trim any bushes or trees on the path to your home.  Use bright outdoor lighting.  Clear any walking paths of anything that might make someone trip, such as rocks or tools.  Regularly check to see if handrails are loose or broken. Make sure that both sides of any steps have handrails.  Any raised decks and porches should have guardrails on the edges.  Have any leaves, snow, or ice cleared regularly.  Use sand or salt on walking paths during winter.  Clean up any spills in your garage right away. This includes oil or grease spills. What can I do in the bathroom?  Use night lights.  Install grab bars by the toilet and in the tub and shower. Do not use towel bars as grab bars.  Use non-skid mats or decals in the tub or shower.  If you need to sit down in the shower, use a plastic, non-slip stool.  Keep the floor dry. Clean up any water that spills on the floor as soon as  it happens.  Remove soap buildup in the tub or shower regularly.  Attach bath mats securely with double-sided non-slip rug tape.  Do not have throw rugs and other things on the floor that can make you trip. What can I do in the bedroom?  Use night lights.  Make sure that you have a light by your bed that is easy to reach.  Do not use any sheets or blankets that are too big for your bed. They should not hang down onto the floor.  Have a firm chair that has side arms. You can use this for support while you get dressed.  Do not have throw rugs and other things on the floor that can make you trip. What can I do in the kitchen?  Clean up any spills right away.  Avoid walking on wet floors.  Keep items that you use a lot in easy-to-reach places.  If you need to reach something above you, use a strong step stool that has a grab bar.  Keep electrical cords out of the way.  Do not use floor polish or wax that makes floors slippery.  If you must use wax, use non-skid floor wax.  Do not have throw rugs and other things on the floor that can make you trip. What can I do with my stairs?  Do not leave any items on the stairs.  Make sure that there are handrails on both sides of the stairs and use them. Fix handrails that are broken or loose. Make sure that handrails are as long as the stairways.  Check any carpeting to make sure that it is firmly attached to the stairs. Fix any carpet that is loose or worn.  Avoid having throw rugs at the top or bottom of the stairs. If you do have throw rugs, attach them to the floor with carpet tape.  Make sure that you have a light switch at the top of the stairs and the bottom of the stairs. If you do not have them, ask someone to add them for you. What else can I do to help prevent falls?  Wear shoes that:  Do not have high heels.  Have rubber bottoms.  Are comfortable and fit you well.  Are closed at the toe. Do not wear sandals.  If  you use a stepladder:  Make sure that it is fully opened. Do not climb a closed stepladder.  Make sure that both sides of the stepladder are locked into place.  Ask someone to hold it for you, if possible.  Clearly mark and make sure that you can see:  Any grab bars or handrails.  First and last steps.  Where the edge of each step is.  Use tools that help you move around (mobility aids) if they are needed. These include:  Canes.  Walkers.  Scooters.  Crutches.  Turn on the lights when you go into a dark area. Replace any light bulbs as soon as they burn out.  Set up your furniture so you have a clear path. Avoid moving your furniture around.  If any of your floors are uneven, fix them.  If there are any pets around you, be aware of where they are.  Review your medicines with your doctor. Some medicines can make you feel dizzy. This can increase your chance of falling. Ask your doctor what other things that you can do to help prevent falls. This information is not intended to replace advice given to you by your health care provider. Make sure you discuss any questions you have with your health care provider. Document Released: 07/15/2009 Document Revised: 02/24/2016 Document Reviewed: 10/23/2014 Elsevier Interactive Patient Education  2017 [employer].

## 2018-04-20 ENCOUNTER — Encounter: Payer: Self-pay | Admitting: Emergency Medicine

## 2018-04-20 ENCOUNTER — Other Ambulatory Visit: Payer: Self-pay

## 2018-04-20 ENCOUNTER — Emergency Department: Payer: Medicare Other

## 2018-04-20 ENCOUNTER — Emergency Department
Admission: EM | Admit: 2018-04-20 | Discharge: 2018-04-20 | Disposition: A | Discharge status: Home / Self Care | Payer: Medicare Other | Attending: Emergency Medicine | Admitting: Emergency Medicine

## 2018-04-20 DIAGNOSIS — E039 Hypothyroidism, unspecified: Secondary | ICD-10-CM | POA: Insufficient documentation

## 2018-04-20 DIAGNOSIS — S52511A Displaced fracture of right radial styloid process, initial encounter for closed fracture: Secondary | ICD-10-CM | POA: Diagnosis not present

## 2018-04-20 DIAGNOSIS — Y939 Activity, unspecified: Secondary | ICD-10-CM | POA: Diagnosis not present

## 2018-04-20 DIAGNOSIS — I10 Essential (primary) hypertension: Secondary | ICD-10-CM | POA: Diagnosis not present

## 2018-04-20 DIAGNOSIS — S6991XA Unspecified injury of right wrist, hand and finger(s), initial encounter: Secondary | ICD-10-CM | POA: Diagnosis present

## 2018-04-20 DIAGNOSIS — Z87891 Personal history of nicotine dependence: Secondary | ICD-10-CM | POA: Insufficient documentation

## 2018-04-20 DIAGNOSIS — E109 Type 1 diabetes mellitus without complications: Secondary | ICD-10-CM | POA: Insufficient documentation

## 2018-04-20 DIAGNOSIS — Y92002 Bathroom of unspecified non-institutional (private) residence single-family (private) house as the place of occurrence of the external cause: Secondary | ICD-10-CM | POA: Diagnosis not present

## 2018-04-20 DIAGNOSIS — Y999 Unspecified external cause status: Secondary | ICD-10-CM | POA: Insufficient documentation

## 2018-04-20 DIAGNOSIS — Z79899 Other long term (current) drug therapy: Secondary | ICD-10-CM | POA: Diagnosis not present

## 2018-04-20 DIAGNOSIS — W010XXA Fall on same level from slipping, tripping and stumbling without subsequent striking against object, initial encounter: Secondary | ICD-10-CM | POA: Insufficient documentation

## 2018-04-20 DIAGNOSIS — S52571A Other intraarticular fracture of lower end of right radius, initial encounter for closed fracture: Secondary | ICD-10-CM

## 2018-04-20 MED ORDER — HYDROCODONE-ACETAMINOPHEN 5-325 MG PO TABS: 1.0000 | ORAL_TABLET | Freq: Four times a day (QID) | ORAL | 0 refills | Status: DC | PRN

## 2018-04-20 NOTE — ED Triage Notes (Signed)
Tripped and fell yesterday, pain R wrist.

## 2018-04-20 NOTE — ED Provider Notes (Signed)
[facility] EMERGENCY DEPARTMENT Provider Note   CSN: [id number] Arrival date & time: 04/20/18  1706     History   Chief Complaint Chief Complaint  Patient presents with  . Wrist Pain    HPI Debbie Dennis is a 72 y.o. female presents to the emergency department for evaluation of right wrist pain.  Patient states last night she was getting ready in the bathroom when she slipped and fell and injured her right wrist.  Patient states she fell backwards and landed on her outstretched right hand.  She denies any other injury to her body.  She continued to get ready and went to funeral home for visitation last night and then today went to the funeral.   she is continued to have pain throughout the right wrist.  She is had some mild swelling.  No numbness or tingling.  She points her pain along the radiocarpal joint but denies any other pain throughout the digits as well as any pain or discomfort throughout the forearm and elbow.  She took ibuprofen last night with mild improvement.  She is not on any blood thinners.  HPI  Past Medical History:  Diagnosis Date  . Adaptive colitis   . Anxiety   . Arthritis   . Bronchitis    CHRONIC  . Buedinger-Ludloff-Laewen disease   . Diabetes mellitus without complication ([place])   . GERD (gastroesophageal reflux disease)   . Headache    MIGRAINES  . Hypertension   . Hypothyroidism   . Restless leg syndrome     Patient Active Problem List   Diagnosis Date Noted  . Anxiety 02/05/2015  . Allergic rhinitis 02/05/2015  . Arthritis 02/05/2015  . Acute exacerbation of chronic obstructive airways disease ([place]) 02/05/2015  . 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 Insulin dependent type 1 diabetes mellitus ([place]) 02/05/2015  . Bloodgood disease 02/05/2015  . Acid reflux 02/05/2015  . HLD (hyperlipidemia) 02/05/2015  . BP (high blood pressure) 02/05/2015  . Adult hypothyroidism 02/05/2015  . Adaptive colitis 02/05/2015    . Osteopenia 02/05/2015  . Restless leg 02/05/2015  . History of tobacco abuse 02/05/2015  . Venous stasis syndrome 02/05/2015    Past Surgical History:  Procedure Laterality Date  . ABDOMINAL HYSTERECTOMY     has ovaries-due to abnormal pap smears  . BREAST EXCISIONAL BIOPSY Left 1988  . BREAST SURGERY     lumpectomy  . CATARACT EXTRACTION W/PHACO Left 01/20/2016   Procedure: CATARACT EXTRACTION PHACO AND INTRAOCULAR LENS PLACEMENT (IOC);  Surgeon: Leandrew Koyanagi, MD;  Location: [facility] ORS;  Service: Ophthalmology;  Laterality: Left;  Lot # [id number] H US:02:01:5 AP:20.2% CDE: 24.51  . CATARACT EXTRACTION W/PHACO Right 11/28/2016   Procedure: CATARACT EXTRACTION PHACO AND INTRAOCULAR LENS PLACEMENT (IOC);  Surgeon: Leandrew Koyanagi, MD;  Location: [facility] ORS;  Service: Ophthalmology;  Laterality: Right;  [place] 01:01 AP% 18.3 CDE 11.26 Fluid pack lot # [id number]  . COLONOSCOPY WITH PROPOFOL N/A 05/14/2015   Procedure: COLONOSCOPY WITH PROPOFOL;  Surgeon: Josefine Class, MD;  Location[facility] ENDOSCOPY;  Service: Endoscopy;  Laterality: N/A;  . EYE SURGERY     cataract left     OB History    Gravida  1   Para  1   Term      Preterm      AB      Living        SAB      TAB      Ectopic  Multiple      Live Births               Home Medications    Prior to Admission medications   Medication Sig Start Date End Date Taking? Authorizing Provider  ALPRAZolam Duanne Moron) 0.5 MG tablet TAKE 1 TO 2 TABLETS BY MOUTH ONCE DAILY AS NEEDED 03/04/18   Jerrol Banana., MD  fluticasone [facility]) 50 MCG/ACT nasal spray Place into both nostrils daily.     [provider]  gabapentin (NEURONTIN) 100 MG capsule TAKE ONE CAPSULE BY MOUTH THREE TIMES DAILY 07/26/17   Jerrol Banana., MD  glucose blood (RELION GLUCOSE TEST STRIPS) test strip USE TO CHECK SUGAR SIX TIMES DAILY 01/22/18   Jerrol Banana., MD  HYDROcodone-acetaminophen [facility]) 5-325 MG  tablet Take 1 tablet by mouth every 6 (six) hours as needed for moderate pain. 04/20/18   Duanne Guess, PA-C  insulin aspart (NOVOLOG) 100 UNIT/ML injection INJECT UP TO 60 UNITS SUBCUTANEOUSLY DAILY PER  SLIDING  SCALE Patient not taking: Reported on 04/18/2018 04/11/18   Jerrol Banana., MD  insulin aspart (NOVOLOG) 100 UNIT/ML injection Per sliding scale: up to 60 units daily. DX E11.9 04/14/18   Jerrol Banana., MD  LANTUS 100 UNIT/ML injection INJECT 10 UNITS SUBCUTANEOUSLY AT BEDTIME 10/30/17   Jerrol Banana., MD  levothyroxine (SYNTHROID, LEVOTHROID) 50 MCG tablet TAKE ONE TABLET BY MOUTH ONCE DAILY 08/15/17   Jerrol Banana., MD  loratadine (CLARITIN) 10 MG tablet TAKE ONE TABLET BY MOUTH ONCE DAILY 10/19/17   Jerrol Banana., MD  losartan (COZAAR) 25 MG tablet Take 1 tablet (25 mg total) by mouth daily. 06/21/17   Jerrol Banana., MD  omeprazole (PRILOSEC) 20 MG capsule TAKE ONE CAPSULE BY MOUTH ONCE DAILY Patient taking differently: TAKE ONE CAPSULE BY MOUTH ONCE DAILY as needed 08/09/16   Jerrol Banana., MD  ranitidine (ZANTAC) 150 MG tablet TAKE ONE TABLET BY MOUTH TWICE DAILY Patient taking differently: TAKE ONE TABLET BY MOUTH TWICE DAILY as needed 09/18/16   Jerrol Banana., MD  topiramate (TOPAMAX) 50 MG tablet TAKE TWO TABLETS BY MOUTH ONCE DAILY 04/12/17   Jerrol Banana., MD    Family History Family History  Problem Relation Age of Onset  . Cancer Mother        spread into her lungs-died from heart issues   . Heart attack Mother   . COPD Father   . Throat cancer Father   . Heart disease Father   . Arthritis Sister   . Breast cancer Sister 26  . Lung cancer Brother   . Asthma Maternal Grandfather     Social History Social History   Tobacco Use  . Smoking status: Former Smoker    Packs/day: 1.00    Years: 35.00    Pack years: 35.00    Types: Cigarettes    Last attempt to quit: 10/02/2012    Years since  quitting: 5.5  . Smokeless tobacco: Never Used  Substance Use Topics  . Alcohol use: No  . Drug use: No     Allergies   Codeine and Sulfa antibiotics   Review of Systems Review of Systems  Constitutional: Negative for activity change.  Eyes: Negative for pain and visual disturbance.  Respiratory: Negative for shortness of breath.   Cardiovascular: Negative for chest pain and leg swelling.  Gastrointestinal: Negative for abdominal pain.  Genitourinary:  Negative for flank pain and pelvic pain.  Musculoskeletal: Positive for arthralgias and joint swelling. Negative for gait problem, myalgias, neck pain and neck stiffness.  Skin: Negative for wound.  Neurological: Negative for dizziness, syncope, weakness, light-headedness, numbness and headaches.  Psychiatric/Behavioral: Negative for confusion and decreased concentration.     Physical Exam Updated Vital Signs BP (!) 146/69   Pulse 80   Temp 98.4 F (36.9 C) (Oral)   Resp 18   Ht 5\' 1"  (1.549 m)   Wt 58.1 kg (128 lb)   SpO2 98%   BMI 24.19 kg/m   Physical Exam  Constitutional: She is oriented to person, place, and time. She appears well-developed and well-nourished.  HENT:  Head: Normocephalic and atraumatic.  Eyes: Conjunctivae are normal.  Neck: Normal range of motion.  Cardiovascular: Normal rate.  Pulmonary/Chest: Effort normal. No respiratory distress.  Musculoskeletal: Normal range of motion.  Right wrist is tender along the radiocarpal joint with no significant visual deformity.  There is no skin breakdown noted.  She has no swelling throughout the digits with normal sensation throughout the digits.  She is nontender throughout the mid to proximal forearm nor elbow.  Wrist range of motion is limited.  She is neurovascular intact in right upper extremity.  Neurological: She is alert and oriented to person, place, and time.  Skin: Skin is warm. No rash noted.  Psychiatric: She has a normal mood and affect. Her  behavior is normal. Thought content normal.     ED Treatments / Results  Labs (all labs ordered are listed, but only abnormal results are displayed) Labs Reviewed - No data to display  EKG None  Radiology Dg Wrist Complete Right  Result Date: 04/20/2018 CLINICAL DATA:  [place] in bathroom last night. Wrist swelling and pain. Initial encounter. EXAM: RIGHT WRIST - COMPLETE 3+ VIEW COMPARISON:  None. FINDINGS: A minimally displaced fracture is seen through the radial styloid process with intra-articular extension into the radiocarpal joint space. No other fractures are identified. No evidence of dislocation. IMPRESSION: Minimally displaced fracture through the radial styloid process, with intra-articular extension. Electronically Signed   By: Earle Gell M.D.   On: 04/20/2018 18:04    Procedures .Splint Application Date/Time: 04/07/8674 6:11 PM Performed by: Duanne Guess, PA-C Authorized by: Duanne Guess, PA-C   Consent:    Consent obtained:  Verbal   Consent given by:  Patient   Alternatives discussed:  No treatment and delayed treatment Pre-procedure details:    Sensation:  Normal Procedure details:    Laterality:  Right   Location:  Wrist   Wrist:  R wrist   Splint type:  Volar short arm and wrist   Supplies:  Cotton padding, elastic bandage and Ortho-Glass Post-procedure details:    Pain:  Improved   Patient tolerance of procedure:  Tolerated well, no immediate complications Comments:     Sling applied.   (including critical care time)  Medications Ordered in ED Medications - No data to display   Initial Impression / Assessment and Plan / ED Course  I have reviewed the triage vital signs and the nursing notes.  Pertinent labs & imaging results that were available during my care of the patient were reviewed by me and considered in my medical decision making (see chart for details).     72 year old female with right wrist intra-articular distal radial  fracture with mild displacement.  Pain controlled.  No neurological deficits.  No other injury throughout the body.  Patient  given a prescription for Norco for severe pain.  She will use Tylenol and ibuprofen for mild pain.  She is placed into a volar splint today and will follow with orthopedics next week.  She is educated on signs symptoms return to clinic for.  Final Clinical Impressions(s) / ED Diagnoses   Final diagnoses:  Other closed intra-articular fracture of distal end of right radius, initial encounter    ED Discharge Orders        Ordered    HYDROcodone-acetaminophen ([place]) 5-325 MG tablet  Every 6 hours PRN     04/20/18 1811       Renata Caprice 04/20/18 1812    Harvest Dark, MD 04/20/18 2153

## 2018-04-20 NOTE — Discharge Instructions (Signed)
These rest ice and elevate the right upper extremity.  Avoid any wrist range of motion, pushing pulling or lifting with right upper extremity.  He may work on gentle digit range of motion only.  Take Tylenol and/or ibuprofen as needed for mild pain during the day.  For more severe pain you may use Norco.

## 2018-04-20 NOTE — ED Notes (Signed)
Patient states she slipped in the bathroom and caught herself with her right arm.  Patient is in no obvious distress at this time.

## 2018-04-24 ENCOUNTER — Telehealth: Payer: Self-pay | Admitting: *Deleted

## 2018-04-24 DIAGNOSIS — S52571A Other intraarticular fracture of lower end of right radius, initial encounter for closed fracture: Secondary | ICD-10-CM | POA: Diagnosis not present

## 2018-04-24 DIAGNOSIS — Z122 Encounter for screening for malignant neoplasm of respiratory organs: Secondary | ICD-10-CM

## 2018-04-24 DIAGNOSIS — Z87891 Personal history of nicotine dependence: Secondary | ICD-10-CM

## 2018-04-24 NOTE — Telephone Encounter (Signed)
Received referral for initial lung cancer screening scan. Contacted patient and obtained smoking history,(former, quit 2014, 35 pack year) as well as answering questions related to screening process. Patient denies signs of lung cancer such as weight loss or hemoptysis. Patient denies comorbidity that would prevent curative treatment if lung cancer were found. Patient is scheduled for shared decision making visit and CT scan on 05/14/18.

## 2018-05-09 ENCOUNTER — Telehealth: Payer: Self-pay | Admitting: Nurse Practitioner

## 2018-05-14 ENCOUNTER — Inpatient Hospital Stay: Payer: Medicare Other | Attending: Nurse Practitioner | Admitting: Nurse Practitioner

## 2018-05-14 ENCOUNTER — Ambulatory Visit
Admission: RE | Admit: 2018-05-14 | Discharge: 2018-05-14 | Disposition: A | Discharge status: Home / Self Care | Payer: Medicare Other | Source: Ambulatory Visit | Attending: Nurse Practitioner | Admitting: Nurse Practitioner

## 2018-05-14 DIAGNOSIS — Z87891 Personal history of nicotine dependence: Secondary | ICD-10-CM | POA: Insufficient documentation

## 2018-05-14 DIAGNOSIS — I700 Atherosclerosis of aorta: Secondary | ICD-10-CM | POA: Insufficient documentation

## 2018-05-14 DIAGNOSIS — S52571D Other intraarticular fracture of lower end of right radius, subsequent encounter for closed fracture with routine healing: Secondary | ICD-10-CM | POA: Diagnosis not present

## 2018-05-14 DIAGNOSIS — Z122 Encounter for screening for malignant neoplasm of respiratory organs: Secondary | ICD-10-CM | POA: Insufficient documentation

## 2018-05-14 DIAGNOSIS — M25531 Pain in right wrist: Secondary | ICD-10-CM | POA: Diagnosis not present

## 2018-05-14 DIAGNOSIS — J439 Emphysema, unspecified: Secondary | ICD-10-CM | POA: Insufficient documentation

## 2018-05-14 NOTE — Progress Notes (Signed)
In accordance with CMS guidelines, patient has met eligibility criteria including age, absence of signs or symptoms of lung cancer.  Social History   Tobacco Use  . Smoking status: Former Smoker    Packs/day: 1.00    Years: 35.00    Pack years: 35.00    Types: Cigarettes    Last attempt to quit: 10/02/2012    Years since quitting: 5.6  . Smokeless tobacco: Never Used  Substance Use Topics  . Alcohol use: No  . Drug use: No      A shared decision-making session was conducted prior to the performance of CT scan. This includes one or more decision aids, includes benefits and harms of screening, follow-up diagnostic testing, over-diagnosis, false positive rate, and total radiation exposure.   Counseling on the importance of adherence to annual lung cancer LDCT screening, impact of co-morbidities, and ability or willingness to undergo diagnosis and treatment is imperative for compliance of the program.   Counseling on the importance of continued smoking cessation for former smokers; the importance of smoking cessation for current smokers, and information about tobacco cessation interventions have been given to patient including [place] and [facility] quit [place] programs.   Written order for lung cancer screening with LDCT has been given to the patient and any and all questions have been answered to the best of my abilities.    Yearly follow up will be coordinated by Burgess Estelle, Thoracic Navigator.  Beckey Rutter, DNP, AGNP-C [place] at [facility] [phone] (work cell) [phone] (office) 05/14/18 2:24 PM

## 2018-05-15 ENCOUNTER — Encounter: Payer: Self-pay | Admitting: *Deleted

## 2018-05-28 DIAGNOSIS — M25531 Pain in right wrist: Secondary | ICD-10-CM | POA: Diagnosis not present

## 2018-05-28 DIAGNOSIS — S52571D Other intraarticular fracture of lower end of right radius, subsequent encounter for closed fracture with routine healing: Secondary | ICD-10-CM | POA: Diagnosis not present

## 2018-06-12 DIAGNOSIS — Z23 Encounter for immunization: Secondary | ICD-10-CM | POA: Diagnosis not present

## 2018-06-17 ENCOUNTER — Ambulatory Visit (INDEPENDENT_AMBULATORY_CARE_PROVIDER_SITE_OTHER): Payer: Medicare Other | Admitting: Family Medicine

## 2018-06-17 VITALS — BP 124/73 | HR 66 | Resp 14

## 2018-06-17 DIAGNOSIS — E11649 Type 2 diabetes mellitus with hypoglycemia without coma: Secondary | ICD-10-CM | POA: Diagnosis not present

## 2018-06-17 DIAGNOSIS — E109 Type 1 diabetes mellitus without complications: Secondary | ICD-10-CM

## 2018-06-17 NOTE — Progress Notes (Signed)
Debbie Dennis  MRN: [id number] DOB: 10-16-1945  Subjective:  HPI   The patient is a 72 year female who was in the office waiting on her husband who was being seen.  When her husband got out to the waiting room he could tell that she was getting a little shakey.  She was given a little Pepsi but, continued to become more disoriented.  Office staff assisted her to an exam room where her sugar was checked and found to be 22.  She was given more to drink and her next glucose was 37.  Rescue had been called but once they arrived she was more alert and refused to go to the hospital.   After about 15 minutes her glucose was up to 97.  Patient Active Problem List   Diagnosis Date Noted  . Anxiety 02/05/2015  . Allergic rhinitis 02/05/2015  . Arthritis 02/05/2015  . Acute exacerbation of chronic obstructive airways disease ([place]) 02/05/2015  . 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 Insulin dependent type 1 diabetes mellitus ([place]) 02/05/2015  . Bloodgood disease 02/05/2015  . Acid reflux 02/05/2015  . HLD (hyperlipidemia) 02/05/2015  . BP (high blood pressure) 02/05/2015  . Adult hypothyroidism 02/05/2015  . Adaptive colitis 02/05/2015  . Osteopenia 02/05/2015  . Restless leg 02/05/2015  . History of tobacco abuse 02/05/2015  . Venous stasis syndrome 02/05/2015    Past Medical History:  Diagnosis Date  . Adaptive colitis   . Anxiety   . Arthritis   . Bronchitis    CHRONIC  . Buedinger-Ludloff-Laewen disease   . Diabetes mellitus without complication ([place])   . GERD (gastroesophageal reflux disease)   . Headache    MIGRAINES  . Hypertension   . Hypothyroidism   . Restless leg syndrome     Social History   Socioeconomic History  . Marital status: Married    Spouse name: Sonia Side  . Number of children: 1  . Years of education: 64  . Highest education level: 12th grade  Occupational History  . Occupation: retired  Scientific laboratory technician  . Financial resource strain: Not hard at  all  . Food insecurity:    Worry: Never true    Inability: Never true  . Transportation needs:    Medical: No    Non-medical: No  Tobacco Use  . Smoking status: Former Smoker    Packs/day: 1.00    Years: 35.00    Pack years: 35.00    Types: Cigarettes    Last attempt to quit: 10/02/2012    Years since quitting: 5.7  . Smokeless tobacco: Never Used  Substance and Sexual Activity  . Alcohol use: No  . Drug use: No  . Sexual activity: Not Currently    Birth control/protection: None  Lifestyle  . Physical activity:    Days per week: Not on file    Minutes per session: Not on file  . Stress: Rather much  Relationships  . Social connections:    Talks on phone: Not on file    Gets together: Not on file    Attends religious service: Not on file    Active member of club or organization: Not on file    Attends meetings of clubs or organizations: Not on file    Relationship status: Not on file  . Intimate partner violence:    Fear of current or ex partner: Not on file    Emotionally abused: Not on file    Physically abused: Not on file  Forced sexual activity: Not on file  Other Topics Concern  . Not on file  Social History Narrative  . Not on file    Outpatient Encounter Medications as of 06/17/2018  Medication Sig  . ALPRAZolam (XANAX) 0.5 MG tablet TAKE 1 TO 2 TABLETS BY MOUTH ONCE DAILY AS NEEDED  . fluticasone (FLONASE) 50 MCG/ACT nasal spray Place into both nostrils daily.   Marland Kitchen gabapentin (NEURONTIN) 100 MG capsule TAKE ONE CAPSULE BY MOUTH THREE TIMES DAILY  . glucose blood (RELION GLUCOSE TEST STRIPS) test strip USE TO CHECK SUGAR SIX TIMES DAILY  . HYDROcodone-acetaminophen (NORCO) 5-325 MG tablet Take 1 tablet by mouth every 6 (six) hours as needed for moderate pain.  Marland Kitchen insulin aspart (NOVOLOG) 100 UNIT/ML injection INJECT UP TO 60 UNITS SUBCUTANEOUSLY DAILY PER  SLIDING  SCALE  . insulin aspart (NOVOLOG) 100 UNIT/ML injection Per sliding scale: up to 60 units daily.  DX E11.9  . LANTUS 100 UNIT/ML injection INJECT 10 UNITS SUBCUTANEOUSLY AT BEDTIME  . levothyroxine (SYNTHROID, LEVOTHROID) 50 MCG tablet TAKE ONE TABLET BY MOUTH ONCE DAILY  . loratadine (CLARITIN) 10 MG tablet TAKE ONE TABLET BY MOUTH ONCE DAILY  . losartan (COZAAR) 25 MG tablet Take 1 tablet (25 mg total) by mouth daily.  Marland Kitchen omeprazole (PRILOSEC) 20 MG capsule TAKE ONE CAPSULE BY MOUTH ONCE DAILY (Patient taking differently: TAKE ONE CAPSULE BY MOUTH ONCE DAILY as needed)  . ranitidine (ZANTAC) 150 MG tablet TAKE ONE TABLET BY MOUTH TWICE DAILY (Patient taking differently: TAKE ONE TABLET BY MOUTH TWICE DAILY as needed)  . topiramate (TOPAMAX) 50 MG tablet TAKE TWO TABLETS BY MOUTH ONCE DAILY   No facility-administered encounter medications on file as of 06/17/2018.     Allergies  Allergen Reactions  . Codeine Nausea And Vomiting  . Sulfa Antibiotics Rash    Review of Systems  Constitutional: Negative.   Eyes: Negative.   Respiratory: Negative for cough, shortness of breath and wheezing.   Cardiovascular: Negative for chest pain and palpitations.  Gastrointestinal: Negative.   Neurological: Negative for dizziness and headaches.  Endo/Heme/Allergies: Negative.   Psychiatric/Behavioral: Negative.     Objective:  BP 124/73 (BP Location: Left Arm, Patient Position: Sitting, Cuff Size: Normal)   Pulse 66   Resp 14   Physical Exam  Constitutional: She is well-developed, well-nourished, and in no distress.  HENT:  Head: Normocephalic and atraumatic.  Eyes: Conjunctivae are normal. No scleral icterus.  Neck: No thyromegaly present.  Cardiovascular: Normal rate, regular rhythm and normal heart sounds.  Pulmonary/Chest: Effort normal and breath sounds normal.  Musculoskeletal: She exhibits no edema.  Neurological:  Initially pt was her to pick up husband and she became verbally unresponsive--staring straight ahead. Iniial FSBS is 29.  Skin: Skin is warm and dry.  Psychiatric:  Mood, memory, affect and judgment normal.    Assessment and Plan :  1. Type 1 diabetes mellitus without complication ([place]) I insist that pt with 30 years of hypoglycemia be seen by Endocrine. She has been having even more hypoglycemia. - Ambulatory referral to Endocrinology 2.Severe Hypoglycemia After pt was given soft drink BS slowly came up to 57 then 94.EMS called and she refused.  Pt safely left office with her husband.  I have done the exam and reviewed the chart and it is accurate to the best of my knowledge. Development worker, community has been used and  any errors in dictation or transcription are unintentional. Miguel Aschoff M.D. [facility]

## 2018-06-18 ENCOUNTER — Encounter: Payer: Medicare Other | Admitting: Family Medicine

## 2018-06-30 ENCOUNTER — Other Ambulatory Visit: Payer: Self-pay | Admitting: Family Medicine

## 2018-07-04 ENCOUNTER — Other Ambulatory Visit: Payer: Self-pay | Admitting: Family Medicine

## 2018-07-24 ENCOUNTER — Ambulatory Visit (INDEPENDENT_AMBULATORY_CARE_PROVIDER_SITE_OTHER): Payer: Medicare Other | Admitting: Family Medicine

## 2018-07-24 ENCOUNTER — Encounter: Payer: Self-pay | Admitting: Family Medicine

## 2018-07-24 VITALS — BP 122/60 | HR 76 | Temp 98.5°F | Resp 16 | Ht 60.0 in | Wt 127.2 lb

## 2018-07-24 DIAGNOSIS — Z1231 Encounter for screening mammogram for malignant neoplasm of breast: Secondary | ICD-10-CM

## 2018-07-24 DIAGNOSIS — G43009 Migraine without aura, not intractable, without status migrainosus: Secondary | ICD-10-CM | POA: Diagnosis not present

## 2018-07-24 DIAGNOSIS — E7849 Other hyperlipidemia: Secondary | ICD-10-CM | POA: Diagnosis not present

## 2018-07-24 DIAGNOSIS — E1065 Type 1 diabetes mellitus with hyperglycemia: Secondary | ICD-10-CM

## 2018-07-24 DIAGNOSIS — I10 Essential (primary) hypertension: Secondary | ICD-10-CM

## 2018-07-24 DIAGNOSIS — Z87891 Personal history of nicotine dependence: Secondary | ICD-10-CM

## 2018-07-24 MED ORDER — TOPIRAMATE 50 MG PO TABS: 50.0000 mg | ORAL_TABLET | Freq: Three times a day (TID) | ORAL | 12 refills | Status: DC

## 2018-07-24 NOTE — Progress Notes (Signed)
Patient: Debbie Dennis, Female    DOB: 22-Feb-1946, 72 y.o.   MRN: [id number] Visit Date: 07/24/2018  Today's Provider: Wilhemena Durie, MD   Chief Complaint  Patient presents with  . Annual Exam   Subjective:     Complete Physical Debbie Dennis is a 72 y.o. female. She feels well. She reports exercising daily using tredmill and exercise machine. She reports she is sleeping well.Pt quit smoking 8 years ago. Endocrine appt is pending for TIDM.   Last Reported: Tdap-08/25/2008 Flu-06/12/18 Pneumo-01/17/03 Prevnar-08/31/14 Colonoscopy- 05/14/15 Mammogram-09/17/17 -----------------------------------------------------------   Review of Systems  Constitutional: Negative.   HENT: Negative.   Eyes: Negative.   Respiratory: Negative.   Cardiovascular: Negative.   Gastrointestinal: Negative.   Skin: Negative.   Neurological: Negative.   Psychiatric/Behavioral: Negative.   All other systems reviewed and are negative.   Social History   Socioeconomic History  . Marital status: Married    Spouse name: Sonia Side  . Number of children: 1  . Years of education: 25  . Highest education level: 12th grade  Occupational History  . Occupation: retired  Scientific laboratory technician  . Financial resource strain: Not hard at all  . Food insecurity:    Worry: Never true    Inability: Never true  . Transportation needs:    Medical: No    Non-medical: No  Tobacco Use  . Smoking status: Former Smoker    Packs/day: 1.00    Years: 35.00    Pack years: 35.00    Types: Cigarettes    Last attempt to quit: 10/02/2012    Years since quitting: 5.8  . Smokeless tobacco: Never Used  Substance and Sexual Activity  . Alcohol use: No  . Drug use: No  . Sexual activity: Not Currently    Birth control/protection: None  Lifestyle  . Physical activity:    Days per week: Not on file    Minutes per session: Not on file  . Stress: Rather much  Relationships  . Social connections:    Talks on phone:  Not on file    Gets together: Not on file    Attends religious service: Not on file    Active member of club or organization: Not on file    Attends meetings of clubs or organizations: Not on file    Relationship status: Not on file  . Intimate partner violence:    Fear of current or ex partner: Not on file    Emotionally abused: Not on file    Physically abused: Not on file    Forced sexual activity: Not on file  Other Topics Concern  . Not on file  Social History Narrative  . Not on file    Past Medical History:  Diagnosis Date  . Adaptive colitis   . Anxiety   . Arthritis   . Bronchitis    CHRONIC  . Buedinger-Ludloff-Laewen disease   . Diabetes mellitus without complication ([place])   . GERD (gastroesophageal reflux disease)   . Headache    MIGRAINES  . Hypertension   . Hypothyroidism   . Restless leg syndrome      Patient Active Problem List   Diagnosis Date Noted  . Anxiety 02/05/2015  . Allergic rhinitis 02/05/2015  . Arthritis 02/05/2015  . Acute exacerbation of chronic obstructive airways disease ([place]) 02/05/2015  . 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 Insulin dependent type 1 diabetes mellitus ([place]) 02/05/2015  . Bloodgood disease 02/05/2015  . Acid reflux  02/05/2015  . HLD (hyperlipidemia) 02/05/2015  . BP (high blood pressure) 02/05/2015  . Adult hypothyroidism 02/05/2015  . Adaptive colitis 02/05/2015  . Osteopenia 02/05/2015  . Restless leg 02/05/2015  . History of tobacco abuse 02/05/2015  . Venous stasis syndrome 02/05/2015    Past Surgical History:  Procedure Laterality Date  . ABDOMINAL HYSTERECTOMY     has ovaries-due to abnormal pap smears  . BREAST EXCISIONAL BIOPSY Left 1988  . BREAST SURGERY     lumpectomy  . CATARACT EXTRACTION W/PHACO Left 01/20/2016   Procedure: CATARACT EXTRACTION PHACO AND INTRAOCULAR LENS PLACEMENT (IOC);  Surgeon: Leandrew Koyanagi, MD;  Location: [facility] ORS;  Service: Ophthalmology;  Laterality: Left;   Lot # [id number] H US:02:01:5 AP:20.2% CDE: 24.51  . CATARACT EXTRACTION W/PHACO Right 11/28/2016   Procedure: CATARACT EXTRACTION PHACO AND INTRAOCULAR LENS PLACEMENT (IOC);  Surgeon: Leandrew Koyanagi, MD;  Location: [facility] ORS;  Service: Ophthalmology;  Laterality: Right;  [place] 01:01 AP% 18.3 CDE 11.26 Fluid pack lot # [id number]  . COLONOSCOPY WITH PROPOFOL N/A 05/14/2015   Procedure: COLONOSCOPY WITH PROPOFOL;  Surgeon: Josefine Class, MD;  Location: [facility] ENDOSCOPY;  Service: Endoscopy;  Laterality: N/A;  . EYE SURGERY     cataract left    Her family history includes Arthritis in her sister; Asthma in her maternal grandfather; Breast cancer (age of onset: 22) in her sister; COPD in her father; Cancer in her mother; Heart attack in her mother; Heart disease in her father; Lung cancer in her brother; Throat cancer in her father.      Current Outpatient Medications:  .  ALPRAZolam (XANAX) 0.5 MG tablet, TAKE 1 TO 2 TABLETS BY MOUTH ONCE DAILY AS NEEDED, Disp: 60 tablet, Rfl: 3 .  fluticasone (FLONASE) 50 MCG/ACT nasal spray, Place into both nostrils daily. , Disp: , Rfl:  .  gabapentin (NEURONTIN) 100 MG capsule, TAKE ONE CAPSULE BY MOUTH THREE TIMES DAILY, Disp: 90 capsule, Rfl: 12 .  glucose blood (RELION GLUCOSE TEST STRIPS) test strip, USE TO CHECK SUGAR SIX TIMES DAILY, Disp: 200 each, Rfl: 12 .  HYDROcodone-acetaminophen (NORCO) 5-325 MG tablet, Take 1 tablet by mouth every 6 (six) hours as needed for moderate pain., Disp: 15 tablet, Rfl: 0 .  insulin aspart (NOVOLOG) 100 UNIT/ML injection, INJECT UP TO 60 UNITS SUBCUTANEOUSLY DAILY PER  SLIDING  SCALE, Disp: 90 vial, Rfl: 3 .  insulin aspart (NOVOLOG) 100 UNIT/ML injection, Per sliding scale: up to 60 units daily. DX E11.9, Disp: 3 vial, Rfl: 12 .  LANTUS 100 UNIT/ML injection, INJECT 10 UNITS SUBCUTANEOUSLY AT BEDTIME, Disp: 10 mL, Rfl: 12 .  levothyroxine (SYNTHROID, LEVOTHROID) 50 MCG tablet, TAKE ONE TABLET BY MOUTH ONCE DAILY,  Disp: 90 tablet, Rfl: 3 .  loratadine (CLARITIN) 10 MG tablet, TAKE ONE TABLET BY MOUTH ONCE DAILY, Disp: 30 tablet, Rfl: 12 .  losartan (COZAAR) 25 MG tablet, Take 1 tablet (25 mg total) by mouth daily., Disp: 90 tablet, Rfl: 3 .  omeprazole (PRILOSEC) 20 MG capsule, TAKE ONE CAPSULE BY MOUTH ONCE DAILY (Patient taking differently: TAKE ONE CAPSULE BY MOUTH ONCE DAILY as needed), Disp: 90 capsule, Rfl: 3 .  ranitidine (ZANTAC) 150 MG tablet, TAKE ONE TABLET BY MOUTH TWICE DAILY (Patient taking differently: TAKE ONE TABLET BY MOUTH TWICE DAILY as needed), Disp: 60 tablet, Rfl: 12 .  topiramate (TOPAMAX) 50 MG tablet, TAKE 2 TABLETS BY MOUTH ONCE DAILY, Disp: 60 tablet, Rfl: 12  Patient Care Team: Jerrol Banana., MD as PCP -  General (Family Medicine) Leandrew Koyanagi, MD as Referring Physician (Ophthalmology)     Objective:   Vitals: BP 122/60   Pulse 76   Temp 98.5 F (36.9 C) (Oral)   Resp 16   Ht 5' (1.524 m)   Wt 127 lb 3.2 oz (57.7 kg)   SpO2 96%   BMI 24.84 kg/m   Physical Exam  Constitutional: She is oriented to person, place, and time. She appears well-nourished.  HENT:  Head: Normocephalic and atraumatic.  Right Ear: External ear normal.  Left Ear: External ear normal.  Nose: Nose normal.  Mouth/Throat: Oropharynx is clear and moist.  Eyes: Pupils are equal, round, and reactive to light. Conjunctivae are normal. No scleral icterus.  Neck: No thyromegaly present.  Cardiovascular: Normal rate, regular rhythm, normal heart sounds and intact distal pulses.  Pulmonary/Chest: Effort normal and breath sounds normal.  Abdominal: Soft.  Musculoskeletal: She exhibits no edema.  Lymphadenopathy:    She has no cervical adenopathy.  Neurological: She is alert and oriented to person, place, and time.  Skin: Skin is warm and dry.  Psychiatric: She has a normal mood and affect. Her behavior is normal. Judgment and thought content normal.    Activities of Daily  Living In your present state of health, do you have any difficulty performing the following activities: 07/24/2018 04/18/2018  Hearing? N N  Vision? N N  Difficulty concentrating or making decisions? N N  Walking or climbing stairs? N N  Dressing or bathing? N N  Doing errands, shopping? N N  Preparing Food and eating ? - N  Using the Toilet? - N  In the past six months, have you accidently leaked urine? - N  Do you have problems with loss of bowel control? - N  Managing your Medications? - N  Managing your Finances? - N  Housekeeping or managing your Housekeeping? - N  Some recent data might be hidden    Fall Risk Assessment Fall Risk  07/24/2018 04/18/2018 04/17/2017 07/24/2016 03/23/2016  Falls in the past year? Yes No Yes Yes No  Number falls in past yr: 1 - 1 1 -  Injury with Fall? Yes - Yes No -  Comment - - sprained feet and ankle - -  Follow up Falls evaluation completed - Falls prevention discussed - -     Depression Screen PHQ 2/9 Scores 07/24/2018 04/18/2018 04/17/2017 04/17/2017  PHQ - 2 Score 0 0 0 0  PHQ- 9 Score 0 - 2 -    Cognitive Testing - 6-CIT  Correct? Score   What year is it? yes 0 0 or 4  What month is it? yes 0 0 or 3  Memorize:    Pia Mau,  43,  [address]  [place],      What time is it? (within 1 hour) yes 0 0 or 3  Count backwards from 20 yes 0 0, 2, or 4  Name the months of the year yes 0 0, 2, or 4  Repeat name & address above yes 0 0, 2, 4, 6, 8, or 10       TOTAL SCORE  0/28   Interpretation:  Normal  Normal (0-7) Abnormal (8-28)       Assessment & Plan:    Annual Physical Reviewed patient's Family Medical History Reviewed and updated list of patient's medical providers Assessment of cognitive impairment was done Assessed patient's functional ability Established a written schedule for health screening [place] Completed and  Reviewed  Exercise Activities and Dietary recommendations Goals    . DIET -  INCREASE WATER INTAKE     Recommend increasing water intake to 4 glasses a day.     . Increase water intake     Recommend increasing water intake to 4-6 glasses a day.        Immunization History  Administered Date(s) Administered  . Influenza, High Dose Seasonal PF 07/22/2015, 07/24/2016, 07/19/2017, 06/12/2018  . Pneumococcal Conjugate-13 08/31/2014  . Pneumococcal Polysaccharide-23 07/11/2007, 01/16/2013  . Tdap 08/25/2008  . Zoster 03/02/2012    Health Maintenance  Topic Date Due  . HEMOGLOBIN A1C  11/10/2017  . FOOT EXAM  04/17/2018  . OPHTHALMOLOGY EXAM  07/23/2018  . TETANUS/TDAP  08/25/2018  . MAMMOGRAM  09/18/2019  . COLONOSCOPY  05/13/2025  . INFLUENZA VACCINE  Completed  . DEXA SCAN  Completed  . Hepatitis C Screening  Completed  . PNA vac Low Risk Adult  Completed     Discussed health benefits of physical activity, and encouraged her to engage in regular exercise appropriate for her age and condition.  1. Type 1 diabetes mellitus with hyperglycemia ([place]) Endocrine consult pending--pt has refused for 20 years but I insist due to recent hypoglycemia. - HgB A1c  2. Essential hypertension  - CBC with Differential/Platelet - Comprehensive metabolic panel - TSH  3. Health maintenance  - CBC with Differential/Platelet - Comprehensive metabolic panel - Lipid panel - TSH - HgB A1c - MM Digital Screening; Future  4. Other hyperlipidemia  - Lipid panel  5. Breast cancer screening by mammogram  - MM Digital Screening; Future  6. History of tobacco abuse   7. Migraine without aura and without status migrainosus, not intractable Increase Topamax from 50mg  BID to TID.    ------------------------------------------------------------------------------------------------------------   Patient seen and examined by Dr. Miguel Aschoff , note scribed by Jennings Books, [place], MD  [place]

## 2018-08-01 DIAGNOSIS — Z0000 Encounter for general adult medical examination without abnormal findings: Secondary | ICD-10-CM | POA: Diagnosis not present

## 2018-08-01 DIAGNOSIS — I10 Essential (primary) hypertension: Secondary | ICD-10-CM | POA: Diagnosis not present

## 2018-08-01 DIAGNOSIS — E7849 Other hyperlipidemia: Secondary | ICD-10-CM | POA: Diagnosis not present

## 2018-08-01 DIAGNOSIS — E1065 Type 1 diabetes mellitus with hyperglycemia: Secondary | ICD-10-CM | POA: Diagnosis not present

## 2018-08-02 LAB — CBC WITH DIFFERENTIAL/PLATELET
Basophils Absolute: 0.1 10*3/uL (ref 0.0–0.2)
Basos: 1 %
EOS (ABSOLUTE): 0.2 10*3/uL (ref 0.0–0.4)
Eos: 3 %
HEMATOCRIT: 36 % (ref 34.0–46.6)
Hemoglobin: 12.2 g/dL (ref 11.1–15.9)
Immature Grans (Abs): 0 10*3/uL (ref 0.0–0.1)
Immature Granulocytes: 0 %
LYMPHS ABS: 2.1 10*3/uL (ref 0.7–3.1)
Lymphs: 40 %
MCH: 32.5 pg (ref 26.6–33.0)
MCHC: 33.9 g/dL (ref 31.5–35.7)
MCV: 96 fL (ref 79–97)
MONOS ABS: 0.5 10*3/uL (ref 0.1–0.9)
Monocytes: 10 %
NEUTROS PCT: 46 %
Neutrophils Absolute: 2.4 10*3/uL (ref 1.4–7.0)
PLATELETS: 330 10*3/uL (ref 150–450)
RBC: 3.75 x10E6/uL — AB (ref 3.77–5.28)
RDW: 11.9 % — AB (ref 12.3–15.4)
WBC: 5.3 10*3/uL (ref 3.4–10.8)

## 2018-08-02 LAB — COMPREHENSIVE METABOLIC PANEL
ALK PHOS: 94 IU/L (ref 39–117)
ALT: 8 IU/L (ref 0–32)
AST: 16 IU/L (ref 0–40)
Albumin/Globulin Ratio: 2.2 (ref 1.2–2.2)
Albumin: 4 g/dL (ref 3.5–4.8)
BUN / CREAT RATIO: 14 (ref 12–28)
BUN: 16 mg/dL (ref 8–27)
Bilirubin Total: 0.3 mg/dL (ref 0.0–1.2)
CO2: 20 mmol/L (ref 20–29)
CREATININE: 1.18 mg/dL — AB (ref 0.57–1.00)
Calcium: 9 mg/dL (ref 8.7–10.3)
Chloride: 108 mmol/L — ABNORMAL HIGH (ref 96–106)
GFR calc non Af Amer: 47 mL/min/{1.73_m2} — ABNORMAL LOW (ref >59)
GFR, EST AFRICAN AMERICAN: 54 mL/min/{1.73_m2} — AB (ref >59)
GLOBULIN, TOTAL: 1.8 g/dL (ref 1.5–4.5)
GLUCOSE: 157 mg/dL — AB (ref 65–99)
Potassium: 4.3 mmol/L (ref 3.5–5.2)
Sodium: 141 mmol/L (ref 134–144)
Total Protein: 5.8 g/dL — ABNORMAL LOW (ref 6.0–8.5)

## 2018-08-02 LAB — HEMOGLOBIN A1C
Est. average glucose Bld gHb Est-mCnc: 148 mg/dL
HEMOGLOBIN A1C: 6.8 % — AB (ref 4.8–5.6)

## 2018-08-02 LAB — LIPID PANEL
Chol/HDL Ratio: 2.4 ratio (ref 0.0–4.4)
Cholesterol, Total: 159 mg/dL (ref 100–199)
HDL: 66 mg/dL (ref >39)
LDL CALC: 83 mg/dL (ref 0–99)
Triglycerides: 51 mg/dL (ref 0–149)
VLDL CHOLESTEROL CAL: 10 mg/dL (ref 5–40)

## 2018-08-02 LAB — TSH: TSH: 2.4 u[IU]/mL (ref 0.450–4.500)

## 2018-08-04 ENCOUNTER — Other Ambulatory Visit: Payer: Self-pay | Admitting: Family Medicine

## 2018-08-04 DIAGNOSIS — E1065 Type 1 diabetes mellitus with hyperglycemia: Secondary | ICD-10-CM

## 2018-08-08 ENCOUNTER — Other Ambulatory Visit: Payer: Self-pay | Admitting: Family Medicine

## 2018-08-08 DIAGNOSIS — E1065 Type 1 diabetes mellitus with hyperglycemia: Secondary | ICD-10-CM

## 2018-08-13 ENCOUNTER — Telehealth: Payer: Self-pay | Admitting: Family Medicine

## 2018-08-13 ENCOUNTER — Other Ambulatory Visit: Payer: Self-pay

## 2018-08-13 DIAGNOSIS — E1065 Type 1 diabetes mellitus with hyperglycemia: Secondary | ICD-10-CM

## 2018-08-13 NOTE — Telephone Encounter (Signed)
Pt's husband called and stated she needs test strips (Relion Premier) sent to [facility]. States the strips that were sent in didn't have a brand and pharmacy needed brand to fill.

## 2018-08-14 MED ORDER — RELION PREMIER TEST VI STRP
ORAL_STRIP | 12 refills | Status: DC
Start: 2018-08-14 — End: 2018-11-25

## 2018-08-14 NOTE — Telephone Encounter (Signed)
Test strips were sent into the pharmacy.

## 2018-08-16 ENCOUNTER — Other Ambulatory Visit: Payer: Self-pay

## 2018-08-19 ENCOUNTER — Other Ambulatory Visit: Payer: Self-pay | Admitting: Family Medicine

## 2018-08-19 DIAGNOSIS — Z1231 Encounter for screening mammogram for malignant neoplasm of breast: Secondary | ICD-10-CM

## 2018-08-20 ENCOUNTER — Other Ambulatory Visit: Payer: Self-pay | Admitting: Family Medicine

## 2018-08-21 ENCOUNTER — Other Ambulatory Visit: Payer: Self-pay | Admitting: Family Medicine

## 2018-08-27 ENCOUNTER — Other Ambulatory Visit: Payer: Self-pay | Admitting: Family Medicine

## 2018-08-27 DIAGNOSIS — M79604 Pain in right leg: Secondary | ICD-10-CM

## 2018-08-27 DIAGNOSIS — M5431 Sciatica, right side: Secondary | ICD-10-CM

## 2018-10-07 DIAGNOSIS — E1022 Type 1 diabetes mellitus with diabetic chronic kidney disease: Secondary | ICD-10-CM | POA: Diagnosis not present

## 2018-10-07 DIAGNOSIS — E109 Type 1 diabetes mellitus without complications: Secondary | ICD-10-CM | POA: Diagnosis not present

## 2018-10-07 DIAGNOSIS — N183 Chronic kidney disease, stage 3 (moderate): Secondary | ICD-10-CM | POA: Diagnosis not present

## 2018-10-14 ENCOUNTER — Ambulatory Visit
Admission: RE | Admit: 2018-10-14 | Discharge: 2018-10-14 | Disposition: A | Discharge status: Home / Self Care | Payer: Medicare HMO | Source: Ambulatory Visit | Attending: Family Medicine | Admitting: Family Medicine

## 2018-10-14 DIAGNOSIS — Z1231 Encounter for screening mammogram for malignant neoplasm of breast: Secondary | ICD-10-CM | POA: Diagnosis not present

## 2018-10-24 ENCOUNTER — Other Ambulatory Visit: Payer: Self-pay | Admitting: Family Medicine

## 2018-10-25 ENCOUNTER — Telehealth: Payer: Self-pay

## 2018-10-25 NOTE — Telephone Encounter (Signed)
Maybe a Glucagon "pen" in case of hypoglycemia?    Left message for Debbie Dennis to call back to clarify.    Thanks,   -Mickel Baas

## 2018-10-25 NOTE — Telephone Encounter (Signed)
I have no idea what he is talking about

## 2018-10-25 NOTE — Telephone Encounter (Signed)
Patients husband had called the office requesting that we send in prescription for a "pen". Patient husband says there is a pen that you can inject into your skin if you have a syncope episode. Patients husband states that patient is a diabetic and has a history of syncope episodes. I informed patients spouse that Dr. Rosanna Randy is out of office and will return Monday, he is requesting that I forward message to MD in office and have prescription sent today to [facility] on [address]. KW

## 2018-10-28 NOTE — Telephone Encounter (Signed)
Dr Darnell Level Can you please review and let us know what you want to do about this one? Thanks

## 2018-10-28 NOTE — Telephone Encounter (Signed)
Through Dr Gabriel Carina.

## 2018-10-28 NOTE — Telephone Encounter (Signed)
LMTCB ED 

## 2018-10-31 NOTE — Telephone Encounter (Signed)
Advised  ED 

## 2018-11-04 ENCOUNTER — Other Ambulatory Visit: Payer: Self-pay | Admitting: Family Medicine

## 2018-11-25 ENCOUNTER — Ambulatory Visit (INDEPENDENT_AMBULATORY_CARE_PROVIDER_SITE_OTHER): Payer: Medicare HMO | Admitting: Family Medicine

## 2018-11-25 VITALS — BP 120/64 | HR 85 | Temp 97.8°F | Resp 16 | Wt 129.0 lb

## 2018-11-25 DIAGNOSIS — I10 Essential (primary) hypertension: Secondary | ICD-10-CM | POA: Diagnosis not present

## 2018-11-25 DIAGNOSIS — E109 Type 1 diabetes mellitus without complications: Secondary | ICD-10-CM

## 2018-11-25 DIAGNOSIS — Z8669 Personal history of other diseases of the nervous system and sense organs: Secondary | ICD-10-CM | POA: Diagnosis not present

## 2018-11-25 DIAGNOSIS — E782 Mixed hyperlipidemia: Secondary | ICD-10-CM

## 2018-11-25 MED ORDER — LORATADINE 10 MG PO TABS: 10.0000 mg | ORAL_TABLET | Freq: Every day | ORAL | 11 refills | Status: DC

## 2018-11-25 NOTE — Progress Notes (Signed)
Debbie Dennis  MRN: [id number] DOB: [birth year]  Subjective:  HPI   The patient is a 73 year old female who presents for follow up of migraine headaches.  She was last seen on 07/24/18 and at that time she was instructed to increase her Topimax.  Since increasing her medicine she has done better.  She states that since her last visit she has only had two headaches and they were not as bad as they had been.  She is pleased with the improvement in the headaches and is tolerating the Topamax well.  Patient Active Problem List   Diagnosis Date Noted  . Anxiety 02/05/2015  . Allergic rhinitis 02/05/2015  . Arthritis 02/05/2015  . Acute exacerbation of chronic obstructive airways disease ([place]) 02/05/2015  . 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 Insulin dependent type 1 diabetes mellitus ([place]) 02/05/2015  . Bloodgood disease 02/05/2015  . Acid reflux 02/05/2015  . HLD (hyperlipidemia) 02/05/2015  . BP (high blood pressure) 02/05/2015  . Adult hypothyroidism 02/05/2015  . Adaptive colitis 02/05/2015  . Osteopenia 02/05/2015  . Restless leg 02/05/2015  . History of tobacco abuse 02/05/2015  . Venous stasis syndrome 02/05/2015    Past Medical History:  Diagnosis Date  . Adaptive colitis   . Anxiety   . Arthritis   . Bronchitis    CHRONIC  . Buedinger-Ludloff-Laewen disease   . Diabetes mellitus without complication ([place])   . GERD (gastroesophageal reflux disease)   . Headache    MIGRAINES  . Hypertension   . Hypothyroidism   . Restless leg syndrome     Social History   Socioeconomic History  . Marital status: Married    Spouse name: Sonia Side  . Number of children: 1  . Years of education: 80  . Highest education level: 12th grade  Occupational History  . Occupation: retired  Scientific laboratory technician  . Financial resource strain: Not hard at all  . Food insecurity:    Worry: Never true    Inability: Never true  . Transportation needs:    Medical: No    Non-medical: No    Tobacco Use  . Smoking status: Former Smoker    Packs/day: 1.00    Years: 35.00    Pack years: 35.00    Types: Cigarettes    Last attempt to quit: 10/02/2012    Years since quitting: 6.1  . Smokeless tobacco: Never Used  Substance and Sexual Activity  . Alcohol use: No  . Drug use: No  . Sexual activity: Not Currently    Birth control/protection: None  Lifestyle  . Physical activity:    Days per week: Not on file    Minutes per session: Not on file  . Stress: Rather much  Relationships  . Social connections:    Talks on phone: Not on file    Gets together: Not on file    Attends religious service: Not on file    Active member of club or organization: Not on file    Attends meetings of clubs or organizations: Not on file    Relationship status: Not on file  . Intimate partner violence:    Fear of current or ex partner: Not on file    Emotionally abused: Not on file    Physically abused: Not on file    Forced sexual activity: Not on file  Other Topics Concern  . Not on file  Social History Narrative  . Not on file    Outpatient Encounter Medications as of  11/25/2018  Medication Sig  . ALPRAZolam (XANAX) 0.5 MG tablet TAKE 1 TO 2 TABLETS BY MOUTH ONCE DAILY AS NEEDED  . fluticasone (FLONASE) 50 MCG/ACT nasal spray Place into both nostrils daily.   Marland Kitchen gabapentin (NEURONTIN) 100 MG capsule TAKE 1 CAPSULE BY MOUTH THREE TIMES DAILY  . insulin aspart (NOVOLOG) 100 UNIT/ML injection INJECT UP TO 60 UNITS SUBCUTANEOUSLY DAILY PER  SLIDING  SCALE  . insulin aspart (NOVOLOG) 100 UNIT/ML injection Per sliding scale: up to 60 units daily. DX E11.9  . LANTUS 100 UNIT/ML injection INJECT 10 UNITS SUBCUTANEOUSLY AT BEDTIME  . levothyroxine (SYNTHROID, LEVOTHROID) 50 MCG tablet TAKE 1 TABLET BY MOUTH ONCE DAILY  . loratadine (CLARITIN) 10 MG tablet Take 1 tab daily  . losartan (COZAAR) 25 MG tablet TAKE 1 TABLET BY MOUTH ONCE DAILY  . topiramate (TOPAMAX) 50 MG tablet Take 1 tablet (50  mg total) by mouth 3 (three) times daily.  Marland Kitchen HYDROcodone-acetaminophen (NORCO) 5-325 MG tablet Take 1 tablet by mouth every 6 (six) hours as needed for moderate pain. (Patient not taking: Reported on 11/25/2018)  . [DISCONTINUED] glucose blood (RELION GLUCOSE TEST STRIPS) test strip USE 1 STRIP TO CHECK BLOOD SUGAR SIX TIMES DAILY  . [DISCONTINUED] omeprazole (PRILOSEC) 20 MG capsule TAKE ONE CAPSULE BY MOUTH ONCE DAILY (Patient taking differently: TAKE ONE CAPSULE BY MOUTH ONCE DAILY as needed)  . [DISCONTINUED] ranitidine (ZANTAC) 150 MG tablet TAKE ONE TABLET BY MOUTH TWICE DAILY (Patient taking differently: TAKE ONE TABLET BY MOUTH TWICE DAILY as needed)  . [DISCONTINUED] RELION PREMIER TEST test strip Use as instructed   No facility-administered encounter medications on file as of 11/25/2018.     Allergies  Allergen Reactions  . Codeine Nausea And Vomiting  . Sulfa Antibiotics Rash    Review of Systems  Constitutional: Negative.   HENT: Negative.   Respiratory: Negative for cough, shortness of breath and wheezing.   Cardiovascular: Negative for chest pain.  Gastrointestinal: Negative.   Skin: Negative.        She does feel like she is having hair loss secondary to the Topamax.  Neurological: Negative for headaches.  Endo/Heme/Allergies: Negative.   Psychiatric/Behavioral: Negative.     Objective:  BP 120/64 (BP Location: Right Arm, Patient Position: Sitting, Cuff Size: Normal)   Pulse 85   Temp 97.8 F (36.6 C) (Oral)   Resp 16   Wt 129 lb (58.5 kg)   BMI 25.19 kg/m   Physical Exam  Constitutional: She is oriented to person, place, and time and well-developed, well-nourished, and in no distress.  HENT:  Head: Normocephalic and atraumatic.  Eyes: Conjunctivae are normal. No scleral icterus.  Neck: No thyromegaly present.  Cardiovascular: Normal rate, regular rhythm and normal heart sounds.  Pulmonary/Chest: Effort normal and breath sounds normal.  Abdominal: Soft.    Musculoskeletal:        General: No edema.  Neurological: She is alert and oriented to person, place, and time. Gait normal. GCS score is 15.  Skin: Skin is warm and dry.  Psychiatric: Mood, memory, affect and judgment normal.    Assessment and Plan :  1. Essential hypertension Good control.  2. Hx of migraine headaches Improved on Topamax.  Continue this at present dose.  Patient concerned about alopecia.  She has a really full head of hair.  I would follow this for now.  3. Mixed hyperlipidemia   4. Type 1 diabetes mellitus without complication [facility]) Now followed by endocrinology.  I have done  the exam and reviewed the chart and it is accurate to the best of my knowledge. Development worker, community has been used and  any errors in dictation or transcription are unintentional. Miguel Aschoff M.D. [facility]

## 2018-12-10 ENCOUNTER — Telehealth: Payer: Self-pay | Admitting: Family Medicine

## 2018-12-10 NOTE — Telephone Encounter (Signed)
Pt called to report she started on the [facility] Solo 33 unit pen insulin  CB#  [phone]  Thanks teri

## 2018-12-11 NOTE — Telephone Encounter (Signed)
Please review. Thanks!  

## 2018-12-11 NOTE — Telephone Encounter (Signed)
That refill will be through endocrine

## 2019-02-28 ENCOUNTER — Other Ambulatory Visit: Payer: Self-pay | Admitting: Family Medicine

## 2019-03-03 ENCOUNTER — Other Ambulatory Visit: Payer: Self-pay

## 2019-03-03 MED ORDER — ALPRAZOLAM 0.5 MG PO TABS: ORAL_TABLET | ORAL | 3 refills | Status: DC

## 2019-04-22 ENCOUNTER — Ambulatory Visit (INDEPENDENT_AMBULATORY_CARE_PROVIDER_SITE_OTHER): Payer: Medicare Other

## 2019-04-22 ENCOUNTER — Other Ambulatory Visit: Payer: Self-pay

## 2019-04-22 DIAGNOSIS — E2839 Other primary ovarian failure: Secondary | ICD-10-CM

## 2019-04-22 DIAGNOSIS — Z0000 Encounter for general adult medical examination without abnormal findings: Secondary | ICD-10-CM | POA: Diagnosis not present

## 2019-04-22 NOTE — Progress Notes (Addendum)
Subjective:   Nikeria Kalman Dettinger is a 73 y.o. female who presents for Medicare Annual (Subsequent) preventive examination.    This visit is being conducted through telemedicine due to the COVID-19 pandemic. This patient has given me verbal consent via doximity to conduct this visit, patient states they are participating from their home address. Some vital signs may be absent or patient reported.    Patient identification: identified by name, DOB, and current address  Review of Systems:  N/A  Cardiac Risk Factors include: advanced age (>69mn, >>5women);diabetes mellitus;hypertension     Objective:     Vitals: There were no vitals taken for this visit.  There is no height or weight on file to calculate BMI. Unable to obtain vitals due to visit being conducted via telephonically.   Advanced Directives 04/22/2019 04/20/2018 04/18/2018 04/17/2017 11/28/2016 03/23/2016 01/20/2016  Does Patient Have a Medical Advance Directive? _0  No No  Would patient like information on creating a medical advance directive? No - Patient declined - No - Patient declined No - Patient declined No - Patient declined - -    Tobacco Social History   Tobacco Use  Smoking Status Former Smoker  . Packs/day: 1.00  . Years: 35.00  . Pack years: 35.00  . Types: Cigarettes  . Quit date: 10/02/2012  . Years since quitting: 6.5  Smokeless Tobacco Never Used     Counseling given: Not Answered   Clinical Intake:  Pre-visit preparation completed: Yes  Pain Score: 0-No pain     Nutritional Risks: None Diabetes: Yes  How often do you need to have someone help you when you read instructions, pamphlets, or other written materials from your doctor or pharmacy?: 1 - Never   Diabetes:  Is the patient diabetic?  Yes type 1 If diabetic, was a CBG obtained today?  No  Did the patient bring in their glucometer from home?  No  How often do you monitor your CBG's? 6-12 times a day.   Financial Strains and  Diabetes Management:  Are you having any financial strains with the device, your supplies or your medication? No .  Does the patient want to be seen by Chronic Care Management for management of their diabetes?  No  Would the patient like to be referred to a Nutritionist or for Diabetic Management?  No   Diabetic Exams:  Diabetic Eye Exam: Completed 07/23/17. Overdue for diabetic eye exam. Pt has been advised about the importance in completing this exam.   Diabetic Foot Exam: Completed 04/16/19. Repeat yearly.   Interpreter Needed?: No  [facility]  Past Medical History:  Diagnosis Date  . Adaptive colitis   . Anxiety   . Arthritis   . Bronchitis    CHRONIC  . Buedinger-Ludloff-Laewen disease   . [place]   . GERD (gastroesophageal reflux disease)   . Headache    MIGRAINES  . Hypertension   . Hypothyroidism   . Restless leg syndrome    Past Surgical History:  Procedure Laterality Date  . ABDOMINAL HYSTERECTOMY     has ovaries-due to abnormal pap smears  . BREAST EXCISIONAL BIOPSY Left 1988  . BREAST SURGERY     lumpectomy  . CATARACT EXTRACTION W/PHACO Left 01/20/2016   Procedure: CATARACT EXTRACTION PHACO AND INTRAOCULAR LENS PLACEMENT (IOC);  Surgeon: CLeandrew Koyanagi MD;  Location: [facility] ORS;  Service: Ophthalmology;  Laterality: Left;  Lot # [id number] US:02:01:5 AP:20.2% CDE: 24.51  .  CATARACT EXTRACTION W/PHACO Right 11/28/2016   Procedure: CATARACT EXTRACTION PHACO AND INTRAOCULAR LENS PLACEMENT (IOC);  Surgeon: Leandrew Koyanagi, MD;  Location: [facility] ORS;  Service: Ophthalmology;  Laterality: Right;  [place] 01:01 AP% 18.3 CDE 11.26 Fluid pack lot # [id number]  . COLONOSCOPY WITH PROPOFOL N/A 05/14/2015   Procedure: COLONOSCOPY WITH PROPOFOL;  Surgeon: Josefine Class, MD;  Location: [facility] ENDOSCOPY;  Service: Endoscopy;  Laterality: N/A;  . EYE SURGERY     cataract left   Family History   Problem Relation Age of Onset  . Cancer Mother        spread into her lungs-died from heart issues   . Heart attack Mother   . COPD Father   . Throat cancer Father   . Heart disease Father   . Arthritis Sister   . Breast cancer Sister 20  . Lung cancer Brother   . Asthma Maternal Grandfather    Social History   Socioeconomic History  . Marital status: Married    Spouse name: Sonia Side  . Number of children: 1  . Years of education: 28  . Highest education level: 12th grade  Occupational History  . Occupation: retired  Scientific laboratory technician  . Financial resource strain: Not hard at all  . Food insecurity    Worry: Never true    Inability: Never true  . Transportation needs    Medical: No    Non-medical: No  Tobacco Use  . Smoking status: Former Smoker    Packs/day: 1.00    Years: 35.00    Pack years: 35.00    Types: Cigarettes    Quit date: 10/02/2012    Years since quitting: 6.5  . Smokeless tobacco: Never Used  Substance and Sexual Activity  . Alcohol use: No  . Drug use: No  . Sexual activity: Not Currently    Birth control/protection: None  Lifestyle  . Physical activity    Days per week: 0 days    Minutes per session: 0 min  . Stress: To some extent  Relationships  . Social Herbalist on phone: Patient refused    Gets together: Patient refused    Attends religious service: Patient refused    Active member of club or organization: Patient refused    Attends meetings of clubs or organizations: Patient refused    Relationship status: Patient refused  Other Topics Concern  . Not on file  Social History Narrative  . Not on file    Outpatient Encounter Medications as of 04/22/2019  Medication Sig  . ALPRAZolam (XANAX) 0.5 MG tablet TAKE 1 TO 2 TABLETS BY MOUTH ONCE DAILY AS NEEDED  . Cholecalciferol (VITAMIN D) 50 MCG (2000 UT) tablet Take 2,000 Units by mouth daily.  . Continuous Blood Gluc Receiver (FREESTYLE LIBRE 14 DAY READER) DEVI Use 1 kit every 14  (fourteen) days  . fluticasone (FLONASE) 50 MCG/ACT nasal spray Place into both nostrils daily.   Marland Kitchen gabapentin (NEURONTIN) 100 MG capsule TAKE 1 CAPSULE BY MOUTH THREE TIMES DAILY  . Ibuprofen (ADVIL MIGRAINE) 200 MG CAPS Take by mouth as needed.  . insulin aspart (NOVOLOG) 100 UNIT/ML injection INJECT UP TO 60 UNITS SUBCUTANEOUSLY DAILY PER  SLIDING  SCALE  . Insulin Glargine, 2 Unit Dial, (TOUJEO MAX SOLOSTAR) 300 UNIT/ML SOPN Inject 6-7 Units into the skin at bedtime.  Marland Kitchen levothyroxine (SYNTHROID, LEVOTHROID) 50 MCG tablet TAKE 1 TABLET BY MOUTH ONCE DAILY  . loratadine (CLARITIN) 10 MG tablet Take 1 tablet (10  mg total) by mouth daily.  Marland Kitchen losartan (COZAAR) 25 MG tablet TAKE 1 TABLET BY MOUTH ONCE DAILY  . topiramate (TOPAMAX) 50 MG tablet Take 1 tablet (50 mg total) by mouth 3 (three) times daily. (Patient taking differently: Take 150 mg by mouth daily. nightly)  . ALPRAZolam (XANAX) 0.5 MG tablet TAKE 1 TO 2 TABLETS BY MOUTH ONCE DAILY AS NEEDED (Patient not taking: Reported on 04/22/2019)  . HYDROcodone-acetaminophen (NORCO) 5-325 MG tablet Take 1 tablet by mouth every 6 (six) hours as needed for moderate pain. (Patient not taking: Reported on 11/25/2018)  . insulin aspart (NOVOLOG) 100 UNIT/ML injection Per sliding scale: up to 60 units daily. DX E11.9 (Patient not taking: Reported on 04/22/2019)  . LANTUS 100 UNIT/ML injection INJECT 10 UNITS SUBCUTANEOUSLY AT BEDTIME (Patient not taking: Reported on 04/22/2019)   No facility-administered encounter medications on file as of 04/22/2019.     Activities of Daily Living In your present state of health, do you have any difficulty performing the following activities: 04/22/2019 07/24/2018  Hearing? N N  Vision? N N  Difficulty concentrating or making decisions? N N  Walking or climbing stairs? N N  Dressing or bathing? N N  Doing errands, shopping? N N  Preparing Food and eating ? N -  Using the Toilet? N -  In the past six months, have you  accidently leaked urine? N -  Do you have problems with loss of bowel control? N -  Managing your Medications? N -  Managing your Finances? N -  Housekeeping or managing your Housekeeping? N -  Some recent data might be hidden    Patient Care Team: Jerrol Banana., MD as PCP - General (Family Medicine) Leandrew Koyanagi, MD as Referring Physician (Ophthalmology) Gabriel Carina Betsey Holiday, MD as Physician Assistant (Endocrinology)    Assessment:   This is a routine wellness examination for Darice.  Exercise Activities and Dietary recommendations Current Exercise Habits: Home exercise routine, Type of exercise: Other - see comments(rides an eliptical machine), Time (Minutes): 10, Frequency (Times/Week): 3, Weekly Exercise (Minutes/Week): 30, Intensity: Mild, Exercise limited by: None identified  Goals    . DIET - EAT MORE FRUITS AND VEGETABLES     Recommend to eat 2-3 vegetables in daily diet.     Marland Kitchen DIET - INCREASE WATER INTAKE     Recommend increasing water intake to 4 glasses a day.     . Increase water intake     Recommend increasing water intake to 4-6 glasses a day.        Fall Risk: Fall Risk  04/22/2019 07/24/2018 04/18/2018 04/17/2017 07/24/2016  Falls in the past year? 0 Yes No Yes Yes  Number falls in past yr: - 1 - 1 1  Injury with Fall? - Yes - Yes No  Comment - - - sprained feet and ankle -  Follow up - Falls evaluation completed - Falls prevention discussed -    FALL RISK PREVENTION PERTAINING TO THE HOME:  Any stairs in or around the home? Yes  If so, are there any without handrails? No   Home free of loose throw rugs in walkways, pet beds, electrical cords, etc? Yes  Adequate lighting in your home to reduce risk of falls? Yes   ASSISTIVE DEVICES UTILIZED TO PREVENT FALLS:  Life alert? No  Use of a cane, walker or w/c? No  Grab bars in the bathroom? No  Shower chair or bench in shower? No  Elevated toilet seat or  a handicapped toilet? No   TIMED UP AND  GO:  Was the test performed? No .    Depression Screen PHQ 2/9 Scores 04/22/2019 07/24/2018 04/18/2018 04/17/2017  PHQ - 2 Score 0 0 0 0  PHQ- 9 Score - 0 - 2     Cognitive Function: Declined today.      6CIT Screen 04/17/2017  What Year? 0 points  What month? 0 points  What time? 0 points  Count back from 20 0 points  Months in reverse 0 points  Repeat phrase 4 points  Total Score 4    Immunization History  Administered Date(s) Administered  . Influenza, High Dose Seasonal PF 07/22/2015, 07/24/2016, 07/19/2017, 06/12/2018  . Pneumococcal Conjugate-13 08/31/2014  . Pneumococcal Polysaccharide-23 07/11/2007, 01/16/2013  . Tdap 08/25/2008  . Zoster 03/02/2012    Qualifies for Shingles Vaccine? Yes  Zostavax completed 03/02/12. Due for Shingrix. Education has been provided regarding the importance of this vaccine. Pt has been advised to call insurance company to determine out of pocket expense. Advised may also receive vaccine at local pharmacy or Health Dept. Verbalized acceptance and understanding.  Tdap: Although this vaccine is not a covered service during a Wellness Exam, does the patient still wish to receive this vaccine today?  No .   Flu Vaccine: Up to date  Pneumococcal Vaccine: Completed series  Screening Tests Health Maintenance  Topic Date Due  . DEXA SCAN  06/10/2018  . OPHTHALMOLOGY EXAM  07/23/2018  . TETANUS/TDAP  08/25/2018  . INFLUENZA VACCINE  05/03/2019  . HEMOGLOBIN A1C  10/17/2019  . FOOT EXAM  04/15/2020  . MAMMOGRAM  10/14/2020  . COLONOSCOPY  05/13/2025  . Hepatitis C Screening  Completed  . PNA vac Low Risk Adult  Completed    Cancer Screenings:  Colorectal Screening: Completed 05/14/15. Repeat every 10 years.  Mammogram: Completed 10/14/18.   Bone Density: Completed 06/10/13. Results reflect OSTEOPENIA.Marland Kitchen Repeat every 5 years.   Lung Cancer Screening: (Low Dose CT Chest recommended if Age 12-80 years, 30 pack-year currently smoking  OR have quit w/in 15years.) does qualify however is not due until 05/2019.   Additional Screening:  Hepatitis C Screening: Up to date  Dental Screening: Recommended annual dental exams for proper oral hygiene   Community Resource Referral:  CRR required this visit?  No       Plan:  I have personally reviewed and addressed the Medicare Annual Wellness questionnaire and have noted the following in the patient's chart:  A. Medical and social history B. Use of alcohol, tobacco or illicit drugs  C. Current medications and supplements D. Functional ability and status E.  Nutritional status F.  Physical activity G. Advance directives H. List of other physicians I.  Hospitalizations, surgeries, and ER visits in previous 12 months J.  [place] such as hearing and vision if needed, cognitive and depression L. Referrals and appointments   In addition, I have reviewed and discussed with patient certain preventive protocols, quality metrics, and best practice recommendations. A written personalized care plan for preventive services as well as general preventive health recommendations were provided to patient.   [place]ora Score, [place]  9/89/2119 Nurse Health Advisor   Nurse Notes: Pt to set up an eye exam this year. Declined a future order for a tetanus vaccine.

## 2019-04-22 NOTE — Patient Instructions (Signed)
Debbie Dennis , Thank you for taking time to come for your Medicare Wellness Visit. I appreciate your ongoing commitment to your health goals. Please review the following plan we discussed and let me know if I can assist you in the future.   Screening recommendations/referrals: Colonoscopy: Up to date, due 05/2025 Mammogram: Up to date, due 10/2020 Bone Density: Ordered today. Pt aware office will contact her to set up apt.  Recommended yearly ophthalmology/optometry visit for glaucoma screening and checkup Recommended yearly dental visit for hygiene and checkup  Vaccinations: Influenza vaccine: Up to date Pneumococcal vaccine: Completed series Tdap vaccine: Pt declines today.  Shingles vaccine: Pt declines today.     Advanced directives: Advance directive discussed with you today. Even though you declined this today please call our office should you change your mind and we can give you the proper paperwork for you to fill out.  Conditions/risks identified: Recommend to eat 2-3 vegetables in daily diet.   Next appointment: 04/23/19 @ 2:00 PM with Dr Rosanna Randy.    Preventive Care 72 Years and Older, Female Preventive care refers to lifestyle choices and visits with your health care provider that can promote health and wellness. What does preventive care include?  A yearly physical exam. This is also called an annual well check.  Dental exams once or twice a year.  Routine eye exams. Ask your health care provider how often you should have your eyes checked.  Personal lifestyle choices, including:  Daily care of your teeth and gums.  Regular physical activity.  Eating a healthy diet.  Avoiding tobacco and drug use.  Limiting alcohol use.  Practicing safe sex.  Taking low-dose aspirin every day.  Taking vitamin and mineral supplements as recommended by your health care provider. What happens during an annual well check? The services and screenings done by your health care  provider during your annual well check will depend on your age, overall health, lifestyle risk factors, and family history of disease. Counseling  Your health care provider may ask you questions about your:  Alcohol use.  Tobacco use.  Drug use.  Emotional well-being.  Home and relationship well-being.  Sexual activity.  Eating habits.  History of falls.  Memory and ability to understand (cognition).  Work and work Statistician.  Reproductive health. Screening  You may have the following tests or measurements:  Height, weight, and BMI.  Blood pressure.  Lipid and cholesterol levels. These may be checked every 5 years, or more frequently if you are over 65 years old.  Skin check.  Lung cancer screening. You may have this screening every year starting at age 85 if you have a 30-pack-year history of smoking and currently smoke or have quit within the past 15 years.  Fecal occult blood test (FOBT) of the stool. You may have this test every year starting at age 31.  Flexible sigmoidoscopy or colonoscopy. You may have a sigmoidoscopy every 5 years or a colonoscopy every 10 years starting at age 68.  Hepatitis C blood test.  Hepatitis B blood test.  Sexually transmitted disease (STD) testing.  Diabetes screening. This is done by checking your blood sugar (glucose) after you have not eaten for a while (fasting). You may have this done every 1-3 years.  Bone density scan. This is done to screen for osteoporosis. You may have this done starting at age 65.  Mammogram. This may be done every 1-2 years. Talk to your health care provider about how often you should have  regular mammograms. Talk with your health care provider about your test results, treatment options, and if necessary, the need for more tests. Vaccines  Your health care provider may recommend certain vaccines, such as:  Influenza vaccine. This is recommended every year.  Tetanus, diphtheria, and acellular  pertussis (Tdap, Td) vaccine. You may need a Td booster every 10 years.  Zoster vaccine. You may need this after age 38.  Pneumococcal 13-valent conjugate (PCV13) vaccine. One dose is recommended after age 78.  Pneumococcal polysaccharide (PPSV23) vaccine. One dose is recommended after age 36. Talk to your health care provider about which screenings and vaccines you need and how often you need them. This information is not intended to replace advice given to you by your health care provider. Make sure you discuss any questions you have with your health care provider. Document Released: 10/15/2015 Document Revised: 06/07/2016 Document Reviewed: 07/20/2015 Elsevier Interactive Patient Education  2017 [place] Prevention in the Home Falls can cause injuries. They can happen to people of all ages. There are many things you can do to make your home safe and to help prevent falls. What can I do on the outside of my home?  Regularly fix the edges of walkways and driveways and fix any cracks.  Remove anything that might make you trip as you walk through a door, such as a raised step or threshold.  Trim any bushes or trees on the path to your home.  Use bright outdoor lighting.  Clear any walking paths of anything that might make someone trip, such as rocks or tools.  Regularly check to see if handrails are loose or broken. Make sure that both sides of any steps have handrails.  Any raised decks and porches should have guardrails on the edges.  Have any leaves, snow, or ice cleared regularly.  Use sand or salt on walking paths during winter.  Clean up any spills in your garage right away. This includes oil or grease spills. What can I do in the bathroom?  Use night lights.  Install grab bars by the toilet and in the tub and shower. Do not use towel bars as grab bars.  Use non-skid mats or decals in the tub or shower.  If you need to sit down in the shower, use a plastic,  non-slip stool.  Keep the floor dry. Clean up any water that spills on the floor as soon as it happens.  Remove soap buildup in the tub or shower regularly.  Attach bath mats securely with double-sided non-slip rug tape.  Do not have throw rugs and other things on the floor that can make you trip. What can I do in the bedroom?  Use night lights.  Make sure that you have a light by your bed that is easy to reach.  Do not use any sheets or blankets that are too big for your bed. They should not hang down onto the floor.  Have a firm chair that has side arms. You can use this for support while you get dressed.  Do not have throw rugs and other things on the floor that can make you trip. What can I do in the kitchen?  Clean up any spills right away.  Avoid walking on wet floors.  Keep items that you use a lot in easy-to-reach places.  If you need to reach something above you, use a strong step stool that has a grab bar.  Keep electrical cords out of the  way.  Do not use floor polish or wax that makes floors slippery. If you must use wax, use non-skid floor wax.  Do not have throw rugs and other things on the floor that can make you trip. What can I do with my stairs?  Do not leave any items on the stairs.  Make sure that there are handrails on both sides of the stairs and use them. Fix handrails that are broken or loose. Make sure that handrails are as long as the stairways.  Check any carpeting to make sure that it is firmly attached to the stairs. Fix any carpet that is loose or worn.  Avoid having throw rugs at the top or bottom of the stairs. If you do have throw rugs, attach them to the floor with carpet tape.  Make sure that you have a light switch at the top of the stairs and the bottom of the stairs. If you do not have them, ask someone to add them for you. What else can I do to help prevent falls?  Wear shoes that:  Do not have high heels.  Have rubber  bottoms.  Are comfortable and fit you well.  Are closed at the toe. Do not wear sandals.  If you use a stepladder:  Make sure that it is fully opened. Do not climb a closed stepladder.  Make sure that both sides of the stepladder are locked into place.  Ask someone to hold it for you, if possible.  Clearly mark and make sure that you can see:  Any grab bars or handrails.  First and last steps.  Where the edge of each step is.  Use tools that help you move around (mobility aids) if they are needed. These include:  Canes.  Walkers.  Scooters.  Crutches.  Turn on the lights when you go into a dark area. Replace any light bulbs as soon as they burn out.  Set up your furniture so you have a clear path. Avoid moving your furniture around.  If any of your floors are uneven, fix them.  If there are any pets around you, be aware of where they are.  Review your medicines with your doctor. Some medicines can make you feel dizzy. This can increase your chance of falling. Ask your doctor what other things that you can do to help prevent falls. This information is not intended to replace advice given to you by your health care provider. Make sure you discuss any questions you have with your health care provider. Document Released: 07/15/2009 Document Revised: 02/24/2016 Document Reviewed: 10/23/2014 Elsevier Interactive Patient Education  2017 [employer].

## 2019-04-23 ENCOUNTER — Other Ambulatory Visit: Payer: Self-pay

## 2019-04-23 ENCOUNTER — Ambulatory Visit (INDEPENDENT_AMBULATORY_CARE_PROVIDER_SITE_OTHER): Payer: Medicare Other | Admitting: Family Medicine

## 2019-04-23 ENCOUNTER — Encounter: Payer: Self-pay | Admitting: Family Medicine

## 2019-04-23 VITALS — BP 133/76 | HR 82 | Temp 98.0°F | Resp 16 | Ht 60.0 in | Wt 130.0 lb

## 2019-04-23 DIAGNOSIS — E782 Mixed hyperlipidemia: Secondary | ICD-10-CM

## 2019-04-23 DIAGNOSIS — E109 Type 1 diabetes mellitus without complications: Secondary | ICD-10-CM | POA: Diagnosis not present

## 2019-04-23 DIAGNOSIS — Z87891 Personal history of nicotine dependence: Secondary | ICD-10-CM

## 2019-04-23 DIAGNOSIS — I2510 Atherosclerotic heart disease of native coronary artery without angina pectoris: Secondary | ICD-10-CM | POA: Diagnosis not present

## 2019-04-23 DIAGNOSIS — I10 Essential (primary) hypertension: Secondary | ICD-10-CM

## 2019-04-23 NOTE — Progress Notes (Signed)
Patient: Debbie Dennis, Female    DOB: May 23, 1946, 73 y.o.   MRN: [id number] Visit Date: 04/23/2019  Today's Provider: Wilhemena Durie, MD   Chief Complaint  Patient presents with  . Annual Exam   Subjective:   Patient had AWE on 04/22/2019   Annual  visit Debbie Dennis is a 73 y.o. female. She feels well. She reports exercising not regularly, but she does stay active. She reports she is sleeping well. She does not drink alcohol and quit smoking about 10 years ago._0  marriage,1child,2 stepchildren,10 grandchildren and 4 GGC.   Colonoscopy- 05/14/2015. Internal hemorrhoids. Dr. Rayann Heman. Repeat 5 years.  Mammogram-  10/14/2018. Normal. Repeat in 1 year.     Review of Systems  Constitutional: Negative.   HENT: Negative.   Eyes: Negative.   Respiratory: Negative.   Cardiovascular: Negative.   Gastrointestinal: Negative.   Endocrine: Negative.   Genitourinary: Negative.   Musculoskeletal: Negative.   Skin: Negative.   Allergic/Immunologic: Negative.   Neurological: Negative.   Hematological: Negative.   Psychiatric/Behavioral: Negative.     Social History   Socioeconomic History  . Marital status: Married    Spouse name: Sonia Side  . Number of children: 1  . Years of education: 65  . Highest education level: 12th grade  Occupational History  . Occupation: retired  Scientific laboratory technician  . Financial resource strain: Not hard at all  . Food insecurity    Worry: Never true    Inability: Never true  . Transportation needs    Medical: No    Non-medical: No  Tobacco Use  . Smoking status: Former Smoker    Packs/day: 1.00    Years: 35.00    Pack years: 35.00    Types: Cigarettes    Quit date: 10/02/2012    Years since quitting: 6.5  . Smokeless tobacco: Never Used  Substance and Sexual Activity  . Alcohol use: No  . Drug use: No  . Sexual activity: Not Currently    Birth control/protection: None  Lifestyle  . Physical activity    Days per week: 0 days    Minutes per  session: 0 min  . Stress: To some extent  Relationships  . Social Herbalist on phone: Patient refused    Gets together: Patient refused    Attends religious service: Patient refused    Active member of club or organization: Patient refused    Attends meetings of clubs or organizations: Patient refused    Relationship status: Patient refused  . Intimate partner violence    Fear of current or ex partner: Patient refused    Emotionally abused: Patient refused    Physically abused: Patient refused    Forced sexual activity: Patient refused  Other Topics Concern  . Not on file  Social History Narrative  . Not on file    Past Medical History:  Diagnosis Date  . Adaptive colitis   . Anxiety   . Arthritis   . Bronchitis    CHRONIC  . Buedinger-Ludloff-Laewen disease   . Diabetes mellitus without complication ([place])   . GERD (gastroesophageal reflux disease)   . Headache    MIGRAINES  . Hypertension   . Hypothyroidism   . Restless leg syndrome      Patient Active Problem List   Diagnosis Date Noted  . Anxiety 02/05/2015  . Allergic rhinitis 02/05/2015  . Arthritis 02/05/2015  . Acute exacerbation of chronic obstructive airways disease ([place]) 02/05/2015  .  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 Insulin dependent type 1 diabetes mellitus ([place]) 02/05/2015  . Bloodgood disease 02/05/2015  . Acid reflux 02/05/2015  . HLD (hyperlipidemia) 02/05/2015  . BP (high blood pressure) 02/05/2015  . Adult hypothyroidism 02/05/2015  . Adaptive colitis 02/05/2015  . Osteopenia 02/05/2015  . Restless leg 02/05/2015  . History of tobacco abuse 02/05/2015  . Venous stasis syndrome 02/05/2015    Past Surgical History:  Procedure Laterality Date  . ABDOMINAL HYSTERECTOMY     has ovaries-due to abnormal pap smears  . BREAST EXCISIONAL BIOPSY Left 1988  . BREAST SURGERY     lumpectomy  . CATARACT EXTRACTION W/PHACO Left 01/20/2016   Procedure: CATARACT EXTRACTION  PHACO AND INTRAOCULAR LENS PLACEMENT (IOC);  Surgeon: Leandrew Koyanagi, MD;  Location: [facility] ORS;  Service: Ophthalmology;  Laterality: Left;  Lot # [id number] H US:02:01:5 AP:20.2% CDE: 24.51  . CATARACT EXTRACTION W/PHACO Right 11/28/2016   Procedure: CATARACT EXTRACTION PHACO AND INTRAOCULAR LENS PLACEMENT ([facility]);  Surgeon: Leandrew Koyanagi, MD;  Location: [facility] ORS;  Service: Ophthalmology;  Laterality: Right;  [place] 01:01 AP% 18.3 CDE 11.26 Fluid pack lot # [id number]  . COLONOSCOPY WITH PROPOFOL N/A 05/14/2015   Procedure: COLONOSCOPY WITH PROPOFOL;  Surgeon: Josefine Class, MD;  [facility] END[facility]COPY;  Service: Endoscopy;  Laterality: N/A;  . EYE SURGERY     cataract left    Her family history includes Arthritis in her sister; Asthma in her maternal grandfather; Breast cancer (age of onset: 69) in her sister; COPD in her father; Cancer in her mother; Heart attack in her mother; Heart disease in her father; Lung cancer in her brother; Throat cancer in her father.   Current Outpatient Medications:  .  ALPRAZolam (XANAX) 0.5 MG tablet, TAKE 1 TO 2 TABLETS BY MOUTH ONCE DAILY AS NEEDED, Disp: 60 tablet, Rfl: 2 .  Cholecalciferol (VITAMIN D) 50 MCG (2000 UT) tablet, Take 2,000 Units by mouth daily., Disp: , Rfl:  .  Continuous Blood Gluc Receiver (FREESTYLE LIBRE 14 DAY READER) DEVI, Use 1 kit every 14 (fourteen) days, Disp: , Rfl:  .  fluticasone (FLONASE) 50 MCG/ACT nasal spray, Place into both nostrils daily. , Disp: , Rfl:  .  Ibuprofen (ADVIL MIGRAINE) 200 MG CAPS, Take by mouth as needed., Disp: , Rfl:  .  insulin aspart (NOVOLOG) 100 UNIT/ML injection, INJECT UP TO 60 UNITS SUBCUTANEOUSLY DAILY PER  SLIDING  SCALE, Disp: 90 vial, Rfl: 3 .  Insulin Glargine, 2 Unit Dial, (TOUJEO MAX SOLOSTAR) 300 UNIT/ML SOPN, Inject 6-7 Units into the skin at bedtime., Disp: , Rfl:  .  levothyroxine (SYNTHROID, LEVOTHROID) 50 MCG tablet, TAKE 1 TABLET BY MOUTH ONCE DAILY, Disp: 90 tablet, Rfl: 3 .   loratadine (CLARITIN) 10 MG tablet, Take 1 tablet (10 mg total) by mouth daily., Disp: 30 tablet, Rfl: 11 .  losartan (COZAAR) 25 MG tablet, TAKE 1 TABLET BY MOUTH ONCE DAILY, Disp: 90 tablet, Rfl: 3 .  topiramate (TOPAMAX) 50 MG tablet, Take 1 tablet (50 mg total) by mouth 3 (three) times daily. (Patient taking differently: Take 150 mg by mouth daily. nightly), Disp: 90 tablet, Rfl: 12 .  ALPRAZolam (XANAX) 0.5 MG tablet, TAKE 1 TO 2 TABLETS BY MOUTH ONCE DAILY AS NEEDED (Patient not taking: Reported on 04/22/2019), Disp: 60 tablet, Rfl: 3 .  gabapentin (NEURONTIN) 100 MG capsule, TAKE 1 CAPSULE BY MOUTH THREE TIMES DAILY, Disp: 90 capsule, Rfl: 12 .  HYDROcodone-acetaminophen (NORCO) 5-325 MG tablet, Take 1 tablet by mouth every  6 (six) hours as needed for moderate pain. (Patient not taking: Reported on 11/25/2018), Disp: 15 tablet, Rfl: 0 .  insulin aspart (NOVOLOG) 100 UNIT/ML injection, Per sliding scale: up to 60 units daily. DX E11.9 (Patient not taking: Reported on 04/22/2019), Disp: 3 vial, Rfl: 12 .  LANTUS 100 UNIT/ML injection, INJECT 10 UNITS SUBCUTANEOUSLY AT BEDTIME (Patient not taking: Reported on 04/22/2019), Disp: 10 mL, Rfl: 12  Patient Care Team: Jerrol Banana., MD as PCP - General (Family Medicine) Leandrew Koyanagi, MD as Referring Physician (Ophthalmology) Gabriel Carina Betsey Holiday, MD as Physician Assistant (Endocrinology)    Objective:    Vitals: BP 133/76   Pulse 82   Temp 98 F (36.7 C)   Resp 16   Ht 5' (1.524 m)   Wt 130 lb (59 kg)   BMI 25.39 kg/m   Physical Exam Vitals signs reviewed.  Constitutional:      Appearance: Normal appearance.  HENT:     Head: Normocephalic and atraumatic.     Right Ear: External ear normal.     Left Ear: External ear normal.     Nose: Nose normal.  Eyes:     General: No scleral icterus.    Conjunctiva/sclera: Conjunctivae normal.     Pupils: Pupils are equal, round, and reactive to light.  Neck:     Thyroid: No  thyromegaly.  Cardiovascular:     Rate and Rhythm: Normal rate and regular rhythm.     Heart sounds: Normal heart sounds.  Pulmonary:     Effort: Pulmonary effort is normal.     Breath sounds: Normal breath sounds.  Abdominal:     Palpations: Abdomen is soft.  Lymphadenopathy:     Cervical: No cervical adenopathy.  Skin:    General: Skin is warm and dry.  Neurological:     General: No focal deficit present.     Mental Status: She is alert and oriented to person, place, and time.  Psychiatric:        Mood and Affect: Mood normal.        Behavior: Behavior normal.        Thought Content: Thought content normal.        Judgment: Judgment normal.     Activities of Daily Living In your present state of health, do you have any difficulty performing the following activities: 04/22/2019 07/24/2018  Hearing? N N  Vision? N N  Difficulty concentrating or making decisions? N N  Walking or climbing stairs? N N  Dressing or bathing? N N  Doing errands, shopping? N N  Preparing Food and eating ? N -  Using the Toilet? N -  In the past six months, have you accidently leaked urine? N -  Do you have problems with loss of bowel control? N -  Managing your Medications? N -  Managing your Finances? N -  Housekeeping or managing your Housekeeping? N -  Some recent data might be hidden    Fall Risk Assessment Fall Risk  04/22/2019 07/24/2018 04/18/2018 04/17/2017 07/24/2016  Falls in the past year? 0 Yes No Yes Yes  Number falls in past yr: - 1 - 1 1  Injury with Fall? - Yes - Yes No  Comment - - - sprained feet and ankle -  Follow up - Falls evaluation completed - Falls prevention discussed -     Depression Screen PHQ 2/9 Scores 04/22/2019 07/24/2018 04/18/2018 04/17/2017  PHQ - 2 Score 0 0 0 0  PHQ- 9  Score - 0 - 2    6CIT Screen 04/17/2017  What Year? 0 points  What month? 0 points  What time? 0 points  Count back from 20 0 points  Months in reverse 0 points  Repeat phrase 4  points  Total Score 4      Assessment & Plan:     Annual Wellness Visit  Reviewed patient's Family Medical History Reviewed and updated list of patient's medical providers Assessment of cognitive impairment was done Assessed patient's functional ability Established a written schedule for health screening [place] Completed and Reviewed  Exercise Activities and Dietary recommendations Goals    . DIET - EAT MORE FRUITS AND VEGETABLES     Recommend to eat 2-3 vegetables in daily diet.     Marland Kitchen DIET - INCREASE WATER INTAKE     Recommend increasing water intake to 4 glasses a day.     . Increase water intake     Recommend increasing water intake to 4-6 glasses a day.        Immunization History  Administered Date(s) Administered  . Influenza, High Dose Seasonal PF 07/22/2015, 07/24/2016, 07/19/2017, 06/12/2018  . Pneumococcal Conjugate-13 08/31/2014  . Pneumococcal Polysaccharide-23 07/11/2007, 01/16/2013  . Tdap 08/25/2008  . Zoster 03/02/2012    Health Maintenance  Topic Date Due  . DEXA SCAN  06/10/2018  . OPHTHALMOLOGY EXAM  07/23/2018  . TETANUS/TDAP  08/25/2018  . INFLUENZA VACCINE  05/03/2019  . HEMOGLOBIN A1C  10/17/2019  . FOOT EXAM  04/15/2020  . MAMMOGRAM  10/14/2020  . COLONOSCOPY  05/13/2025  . Hepatitis C Screening  Completed  . PNA vac Low Risk Adult  Completed     Discussed health benefits of physical activity, and encouraged her to engage in regular exercise appropriate for her age and condition.   1. History of tobacco abuse Pt has quit. - CT CHEST LUNG CA SCREEN LOW DOSE W/O CM; Future  2. Essential hypertension Controlled.on Losartan  - CBC with Differential/Platelet - Comprehensive metabolic panel  3. Mixed hyperlipidemia  - Lipid panel - TSH  4. Type 1 diabetes mellitus without complication ([place]) Per Dr Gabriel Carina  5. ASCVD (arteriosclerotic cardiovascular disease) All risk factors treated. Discussed  statin.   Richard Cranford Mon, MD  [facility]

## 2019-04-26 LAB — COMPREHENSIVE METABOLIC PANEL
ALT: 8 IU/L (ref 0–32)
AST: 12 IU/L (ref 0–40)
Albumin/Globulin Ratio: 2.2 (ref 1.2–2.2)
Albumin: 4 g/dL (ref 3.7–4.7)
Alkaline Phosphatase: 82 IU/L (ref 39–117)
BUN/Creatinine Ratio: 12 (ref 12–28)
BUN: 14 mg/dL (ref 8–27)
Bilirubin Total: 0.3 mg/dL (ref 0.0–1.2)
CO2: 20 mmol/L (ref 20–29)
Calcium: 9.1 mg/dL (ref 8.7–10.3)
Chloride: 104 mmol/L (ref 96–106)
Creatinine, Ser: 1.17 mg/dL — ABNORMAL HIGH (ref 0.57–1.00)
GFR calc Af Amer: 54 mL/min/{1.73_m2} — ABNORMAL LOW (ref >59)
GFR calc non Af Amer: 47 mL/min/{1.73_m2} — ABNORMAL LOW (ref >59)
Globulin, Total: 1.8 g/dL (ref 1.5–4.5)
Glucose: 125 mg/dL — ABNORMAL HIGH (ref 65–99)
Potassium: 5.3 mmol/L — ABNORMAL HIGH (ref 3.5–5.2)
Sodium: 139 mmol/L (ref 134–144)
Total Protein: 5.8 g/dL — ABNORMAL LOW (ref 6.0–8.5)

## 2019-04-26 LAB — CBC WITH DIFFERENTIAL/PLATELET
Basophils Absolute: 0.1 10*3/uL (ref 0.0–0.2)
Basos: 1 %
EOS (ABSOLUTE): 0.1 10*3/uL (ref 0.0–0.4)
Eos: 2 %
Hematocrit: 37.8 % (ref 34.0–46.6)
Hemoglobin: 12.4 g/dL (ref 11.1–15.9)
Immature Grans (Abs): 0 10*3/uL (ref 0.0–0.1)
Immature Granulocytes: 0 %
Lymphocytes Absolute: 2.3 10*3/uL (ref 0.7–3.1)
Lymphs: 43 %
MCH: 33.1 pg — ABNORMAL HIGH (ref 26.6–33.0)
MCHC: 32.8 g/dL (ref 31.5–35.7)
MCV: 101 fL — ABNORMAL HIGH (ref 79–97)
Monocytes Absolute: 0.6 10*3/uL (ref 0.1–0.9)
Monocytes: 11 %
Neutrophils Absolute: 2.3 10*3/uL (ref 1.4–7.0)
Neutrophils: 43 %
Platelets: 340 10*3/uL (ref 150–450)
RBC: 3.75 x10E6/uL — ABNORMAL LOW (ref 3.77–5.28)
RDW: 12.4 % (ref 11.7–15.4)
WBC: 5.4 10*3/uL (ref 3.4–10.8)

## 2019-04-26 LAB — LIPID PANEL
Chol/HDL Ratio: 2.6 ratio (ref 0.0–4.4)
Cholesterol, Total: 168 mg/dL (ref 100–199)
HDL: 64 mg/dL (ref >39)
LDL Calculated: 94 mg/dL (ref 0–99)
Triglycerides: 49 mg/dL (ref 0–149)
VLDL Cholesterol Cal: 10 mg/dL (ref 5–40)

## 2019-04-26 LAB — TSH: TSH: 1.4 u[IU]/mL (ref 0.450–4.500)

## 2019-04-29 ENCOUNTER — Telehealth: Payer: Self-pay

## 2019-04-29 NOTE — Telephone Encounter (Signed)
Patient notified of lab results

## 2019-04-29 NOTE — Telephone Encounter (Signed)
-----   Message from Jerrol Banana., MD sent at 04/28/2019  7:13 PM EDT ----- Stable.

## 2019-05-28 ENCOUNTER — Telehealth: Payer: Self-pay | Admitting: *Deleted

## 2019-05-28 ENCOUNTER — Other Ambulatory Visit: Payer: Self-pay

## 2019-05-28 MED ORDER — ALPRAZOLAM 0.5 MG PO TABS: 0.5000 mg | ORAL_TABLET | Freq: Two times a day (BID) | ORAL | 2 refills | Status: DC | PRN

## 2019-05-28 NOTE — Telephone Encounter (Signed)
Left message for patient to notify them that it is time to schedule annual low dose lung cancer screening CT scan. Instructed patient to call back to verify information prior to the scan being scheduled.  

## 2019-05-28 NOTE — Telephone Encounter (Signed)
Patient's husband calling for a refill request on the following medication: ALPRAZolam Duanne Moron) 0.5 MG tablet  Pharmacy: [facility]

## 2019-05-30 ENCOUNTER — Telehealth: Payer: Self-pay | Admitting: *Deleted

## 2019-05-30 DIAGNOSIS — Z122 Encounter for screening for malignant neoplasm of respiratory organs: Secondary | ICD-10-CM

## 2019-05-30 DIAGNOSIS — Z87891 Personal history of nicotine dependence: Secondary | ICD-10-CM

## 2019-05-30 NOTE — Telephone Encounter (Signed)
Patient has been notified that annual lung cancer screening low dose CT scan is due currently or will be in near future. Confirmed that patient is within the age range of 55-77, and asymptomatic, (no signs or symptoms of lung cancer). Patient denies illness that would prevent curative treatment for lung cancer if found. Verified smoking history, (former, quit 10/02/12, 35 pack year). The shared decision making visit was done 05/14/18. Patient is agreeable for CT scan being scheduled.

## 2019-06-06 ENCOUNTER — Ambulatory Visit
Admission: RE | Admit: 2019-06-06 | Discharge: 2019-06-06 | Disposition: A | Discharge status: Home / Self Care | Payer: Medicare Other | Source: Ambulatory Visit | Attending: Nurse Practitioner | Admitting: Nurse Practitioner

## 2019-06-06 ENCOUNTER — Other Ambulatory Visit: Payer: Self-pay

## 2019-06-06 DIAGNOSIS — Z122 Encounter for screening for malignant neoplasm of respiratory organs: Secondary | ICD-10-CM | POA: Insufficient documentation

## 2019-06-06 DIAGNOSIS — Z87891 Personal history of nicotine dependence: Secondary | ICD-10-CM | POA: Diagnosis not present

## 2019-06-12 ENCOUNTER — Encounter: Payer: Self-pay | Admitting: *Deleted

## 2019-06-26 ENCOUNTER — Telehealth: Payer: Self-pay

## 2019-06-26 ENCOUNTER — Ambulatory Visit
Admission: RE | Admit: 2019-06-26 | Discharge: 2019-06-26 | Disposition: A | Discharge status: Home / Self Care | Payer: Medicare Other | Source: Ambulatory Visit | Attending: Family Medicine | Admitting: Family Medicine

## 2019-06-26 DIAGNOSIS — E2839 Other primary ovarian failure: Secondary | ICD-10-CM | POA: Diagnosis not present

## 2019-06-26 DIAGNOSIS — M898X9 Other specified disorders of bone, unspecified site: Secondary | ICD-10-CM

## 2019-06-26 DIAGNOSIS — M810 Age-related osteoporosis without current pathological fracture: Secondary | ICD-10-CM | POA: Diagnosis not present

## 2019-06-26 DIAGNOSIS — Z780 Asymptomatic menopausal state: Secondary | ICD-10-CM | POA: Diagnosis not present

## 2019-06-26 DIAGNOSIS — M8580 Other specified disorders of bone density and structure, unspecified site: Secondary | ICD-10-CM

## 2019-06-26 NOTE — Telephone Encounter (Signed)
LMTCB regarding results.  

## 2019-06-26 NOTE — Telephone Encounter (Signed)
-----   Message from Jerrol Banana., MD sent at 06/26/2019  3:37 PM EDT ----- Osteoporotic.  If patient has never tried it would take alendronate 70 mg once a week.  #4 with 12 refills.

## 2019-06-27 MED ORDER — ALENDRONATE SODIUM 70 MG PO TABS: 70.0000 mg | ORAL_TABLET | ORAL | 12 refills | Status: DC

## 2019-06-27 NOTE — Telephone Encounter (Signed)
Patient returned call and was advised. Medication also send into pharmacy.

## 2019-07-15 ENCOUNTER — Ambulatory Visit (INDEPENDENT_AMBULATORY_CARE_PROVIDER_SITE_OTHER): Payer: Medicare Other

## 2019-07-15 ENCOUNTER — Other Ambulatory Visit: Payer: Self-pay

## 2019-07-15 DIAGNOSIS — Z23 Encounter for immunization: Secondary | ICD-10-CM | POA: Diagnosis not present

## 2019-07-28 ENCOUNTER — Other Ambulatory Visit: Payer: Self-pay | Admitting: Family Medicine

## 2019-08-11 ENCOUNTER — Other Ambulatory Visit: Payer: Self-pay | Admitting: Family Medicine

## 2019-09-15 ENCOUNTER — Other Ambulatory Visit: Payer: Self-pay | Admitting: Family Medicine

## 2019-09-15 NOTE — Telephone Encounter (Signed)
From [facility] 

## 2019-09-15 NOTE — Telephone Encounter (Signed)
Medication Refill - Medication: ALPRAZolam (XANAX) 0.5 MG tablet [id number]    Has the patient contacted their pharmacy? No. (Agent: If no, request that the patient contact the pharmacy for the refill.) (Agent: If yes, when and what did the pharmacy advise?)  Preferred Pharmacy (with phone number or street name): [facility]   Agent: Please be advised that RX refills may take up to 3 business days. We ask that you follow-up with your pharmacy.

## 2019-09-16 MED ORDER — ALPRAZOLAM 0.5 MG PO TABS: ORAL_TABLET | ORAL | 3 refills | Status: DC

## 2019-10-13 ENCOUNTER — Other Ambulatory Visit: Payer: Self-pay | Admitting: Family Medicine

## 2019-10-13 DIAGNOSIS — M5431 Sciatica, right side: Secondary | ICD-10-CM

## 2019-10-13 DIAGNOSIS — M79604 Pain in right leg: Secondary | ICD-10-CM

## 2019-10-15 DIAGNOSIS — E109 Type 1 diabetes mellitus without complications: Secondary | ICD-10-CM | POA: Diagnosis not present

## 2019-10-18 ENCOUNTER — Emergency Department
Admission: EM | Admit: 2019-10-18 | Discharge: 2019-10-18 | Discharge status: Against Medical Advice | Payer: Medicare HMO

## 2019-10-19 ENCOUNTER — Emergency Department (HOSPITAL_COMMUNITY)
Admission: EM | Admit: 2019-10-19 | Discharge: 2019-10-19 | Disposition: A | Discharge status: Home / Self Care | Payer: Medicare HMO | Attending: Emergency Medicine | Admitting: Emergency Medicine

## 2019-10-19 ENCOUNTER — Emergency Department (HOSPITAL_COMMUNITY): Payer: Medicare HMO

## 2019-10-19 ENCOUNTER — Other Ambulatory Visit: Payer: Self-pay

## 2019-10-19 ENCOUNTER — Encounter (HOSPITAL_COMMUNITY): Payer: Self-pay | Admitting: *Deleted

## 2019-10-19 DIAGNOSIS — Z79899 Other long term (current) drug therapy: Secondary | ICD-10-CM | POA: Insufficient documentation

## 2019-10-19 DIAGNOSIS — E109 Type 1 diabetes mellitus without complications: Secondary | ICD-10-CM | POA: Diagnosis not present

## 2019-10-19 DIAGNOSIS — Z794 Long term (current) use of insulin: Secondary | ICD-10-CM | POA: Diagnosis not present

## 2019-10-19 DIAGNOSIS — I10 Essential (primary) hypertension: Secondary | ICD-10-CM | POA: Diagnosis not present

## 2019-10-19 DIAGNOSIS — Y999 Unspecified external cause status: Secondary | ICD-10-CM | POA: Insufficient documentation

## 2019-10-19 DIAGNOSIS — X501XXA Overexertion from prolonged static or awkward postures, initial encounter: Secondary | ICD-10-CM | POA: Insufficient documentation

## 2019-10-19 DIAGNOSIS — E039 Hypothyroidism, unspecified: Secondary | ICD-10-CM | POA: Diagnosis not present

## 2019-10-19 DIAGNOSIS — Y92009 Unspecified place in unspecified non-institutional (private) residence as the place of occurrence of the external cause: Secondary | ICD-10-CM | POA: Diagnosis not present

## 2019-10-19 DIAGNOSIS — S82854A Nondisplaced trimalleolar fracture of right lower leg, initial encounter for closed fracture: Secondary | ICD-10-CM | POA: Diagnosis not present

## 2019-10-19 DIAGNOSIS — S82852A Displaced trimalleolar fracture of left lower leg, initial encounter for closed fracture: Secondary | ICD-10-CM

## 2019-10-19 DIAGNOSIS — Y9301 Activity, walking, marching and hiking: Secondary | ICD-10-CM | POA: Insufficient documentation

## 2019-10-19 DIAGNOSIS — S82851A Displaced trimalleolar fracture of right lower leg, initial encounter for closed fracture: Secondary | ICD-10-CM | POA: Diagnosis not present

## 2019-10-19 DIAGNOSIS — F17210 Nicotine dependence, cigarettes, uncomplicated: Secondary | ICD-10-CM | POA: Insufficient documentation

## 2019-10-19 DIAGNOSIS — S99911A Unspecified injury of right ankle, initial encounter: Secondary | ICD-10-CM | POA: Diagnosis present

## 2019-10-19 LAB — CBG MONITORING, ED: Glucose-Capillary: 149 mg/dL — ABNORMAL HIGH (ref 70–99)

## 2019-10-19 MED ORDER — FENTANYL CITRATE (PF) 100 MCG/2ML IJ SOLN
100.0000 ug | Freq: Once | INTRAMUSCULAR | Status: AC
  Administered 2019-10-19: 50 ug via INTRAVENOUS

## 2019-10-19 MED ORDER — HYDROCODONE-ACETAMINOPHEN 5-325 MG PO TABS
1.0000 | ORAL_TABLET | Freq: Once | ORAL | Status: AC
  Administered 2019-10-19: 19:00:00 1 via ORAL
  Filled 2019-10-19: qty 1

## 2019-10-19 MED ORDER — PROPOFOL 10 MG/ML IV BOLUS
0.5000 mg/kg | Freq: Once | INTRAVENOUS | Status: AC
  Administered 2019-10-19: 17:00:00 29.1 mg via INTRAVENOUS
  Filled 2019-10-19: qty 20

## 2019-10-19 MED ORDER — HYDROCODONE-ACETAMINOPHEN 5-325 MG PO TABS: 2.0000 | ORAL_TABLET | Freq: Four times a day (QID) | ORAL | 0 refills | Status: DC | PRN

## 2019-10-19 MED ORDER — ONDANSETRON 4 MG PO TBDP: 4.0000 mg | ORAL_TABLET | Freq: Three times a day (TID) | ORAL | 0 refills | Status: DC | PRN

## 2019-10-19 MED ORDER — MORPHINE SULFATE (PF) 4 MG/ML IV SOLN
4.0000 mg | Freq: Once | INTRAVENOUS | Status: AC
  Administered 2019-10-19: 17:00:00 4 mg via INTRAVENOUS
  Filled 2019-10-19: qty 1

## 2019-10-19 MED ORDER — FENTANYL CITRATE (PF) 100 MCG/2ML IJ SOLN
INTRAMUSCULAR | Status: AC
  Filled 2019-10-19: qty 2

## 2019-10-19 MED ORDER — ONDANSETRON HCL 4 MG/2ML IJ SOLN
4.0000 mg | Freq: Once | INTRAMUSCULAR | Status: AC
  Administered 2019-10-19: 4 mg via INTRAVENOUS
  Filled 2019-10-19: qty 2

## 2019-10-19 MED ORDER — PROPOFOL 10 MG/ML IV BOLUS
INTRAVENOUS | Status: AC | PRN
  Administered 2019-10-19 (×2): 15 mg via INTRAVENOUS

## 2019-10-19 MED ORDER — DEXTROSE 50 % IV SOLN
25.0000 mL | Freq: Once | INTRAVENOUS | Status: AC
  Administered 2019-10-19: 16:00:00 25 mL via INTRAVENOUS
  Filled 2019-10-19: qty 50

## 2019-10-19 NOTE — Discharge Instructions (Addendum)
You can take 1000 mg of Tylenol.  Do not exceed 4000 mg of Tylenol a day.  Take pain medications as directed for break through pain. Do not drive or operate machinery while taking this medication.   Take Zofran for nausea/vomiting.  As we discussed, you will need to follow-up with referred orthopedic doctor.  Please call his office and arrange for an appointment.

## 2019-10-19 NOTE — ED Triage Notes (Signed)
Pt states that she tripped over her bedroom shoe Friday, c/o pain to right ankle and foot,

## 2019-10-19 NOTE — ED Provider Notes (Signed)
[facility] EMERGENCY DEPARTMENT Provider Note   CSN: [id number] Arrival date & time: 10/19/19  1342     History Chief Complaint  Patient presents with  . Foot Injury    Debbie Dennis is a 74 y.o. female who presents for evaluation of right foot pain that has been ongoing since 10/17/2019.  Patient reports she was walking in her house and states that her foot got tripped up on a shoe, causing her to twist her ankle.  She is unsure of how it twisted.  She states that since then, she has had difficulty ambulating and bearing weight.  Additionally, she reports worsening pain and swelling, prompting ED visit today.  She states she did not hit her head during the fall.  She states she has difficulty moving the foot secondary to pain.  She denies any numbness.  She denies any knee pain, hip pain.  The history is provided by the patient.       Past Medical History:  Diagnosis Date  . Adaptive colitis   . Anxiety   . Arthritis   . Bronchitis    CHRONIC  . Buedinger-Ludloff-Laewen disease   . Diabetes mellitus without complication ([place])   . GERD (gastroesophageal reflux disease)   . Headache    MIGRAINES  . Hypertension   . Hypothyroidism   . Restless leg syndrome     Patient Active Problem List   Diagnosis Date Noted  . Anxiety 02/05/2015  . Allergic rhinitis 02/05/2015  . Arthritis 02/05/2015  . Acute exacerbation of chronic obstructive airways disease ([place]) 02/05/2015  . 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 Insulin dependent type 1 diabetes mellitus ([place]) 02/05/2015  . Bloodgood disease 02/05/2015  . Acid reflux 02/05/2015  . HLD (hyperlipidemia) 02/05/2015  . BP (high blood pressure) 02/05/2015  . Adult hypothyroidism 02/05/2015  . Adaptive colitis 02/05/2015  . Osteopenia 02/05/2015  . Restless leg 02/05/2015  . History of tobacco abuse 02/05/2015  . Venous stasis syndrome 02/05/2015    Past Surgical History:  Procedure Laterality Date  . ABDOMINAL  HYSTERECTOMY     has ovaries-due to abnormal pap smears  . BREAST EXCISIONAL BIOPSY Left 1988  . BREAST SURGERY     lumpectomy  . CATARACT EXTRACTION W/PHACO Left 01/20/2016   Procedure: CATARACT EXTRACTION PHACO AND INTRAOCULAR LENS PLACEMENT (IOC);  Surgeon: Leandrew Koyanagi, MD;  Location: [facility] ORS;  Service: Ophthalmology;  Laterality: Left;  Lot # [id number] H US:02:01:5 AP:20.2% CDE: 24.51  . CATARACT EXTRACTION W/PHACO Right 11/28/2016   Procedure: CATARACT EXTRACTION PHACO AND INTRAOCULAR LENS PLACEMENT (IOC);  Surgeon: Leandrew Koyanagi, MD;  Location: [facility] ORS;  Service: Ophthalmology;  Laterality: Right;  [place] 01:01 AP% 18.3 CDE 11.26 Fluid pack lot # [id number]  . COLONOSCOPY WITH PROPOFOL N/A 05/14/2015   Procedure: COLONOSCOPY WITH PROPOFOL;  Surgeon: Josefine Class, MD;  Location: [facility] ENDOSCOPY;  Service: Endoscopy;  Laterality: N/A;  . EYE SURGERY     cataract left     OB History    Gravida  1   Para  1   Term      Preterm      AB      Living        SAB      TAB      Ectopic      Multiple      Live Births              Family History  Problem Relation Age of Onset  .  Cancer Mother        spread into her lungs-died from heart issues   . Heart attack Mother   . COPD Father   . Throat cancer Father   . Heart disease Father   . Arthritis Sister   . Breast cancer Sister 62  . Lung cancer Brother   . Asthma Maternal Grandfather     Social History   Tobacco Use  . Smoking status: Former Smoker    Packs/day: 1.00    Years: 35.00    Pack years: 35.00    Types: Cigarettes    Quit date: 10/02/2012    Years since quitting: 7.0  . Smokeless tobacco: Never Used  Substance Use Topics  . Alcohol use: No  . Drug use: No    Home Medications Prior to Admission medications   Medication Sig Start Date End Date Taking? Authorizing Provider  alendronate (FOSAMAX) 70 MG tablet Take 1 tablet (70 mg total) by mouth every 7 (seven) days. Take with  a full glass of water on an empty stomach. 06/27/19   Jerrol Banana., MD  ALPRAZolam Duanne Moron) 0.5 MG tablet Take 1 tablet (0.5 mg total) by mouth 2 (two) times daily as needed for anxiety. 05/28/19   Jerrol Banana., MD  ALPRAZolam Duanne Moron) 0.5 MG tablet TAKE 1 TO 2 TABLETS BY MOUTH ONCE DAILY AS NEEDED 09/16/19   Jerrol Banana., MD  Cholecalciferol (VITAMIN D) 50 MCG (2000 UT) tablet Take 2,000 Units by mouth daily.    [provider]  Continuous Blood Gluc Receiver (FREESTYLE LIBRE 14 DAY READER) DEVI Use 1 kit every 14 (fourteen) days 01/17/19   [provider]  fluticasone (FLONASE) 50 MCG/ACT nasal spray Place into both nostrils daily.     [provider]  gabapentin (NEURONTIN) 100 MG capsule TAKE 1 CAPSULE BY MOUTH THREE TIMES DAILY 10/13/19   Jerrol Banana., MD  HYDROcodone-acetaminophen (NORCO/VICODIN) 5-325 MG tablet Take 2 tablets by mouth every 6 (six) hours as needed. 10/19/19   Volanda Napoleon, PA-C  Ibuprofen (ADVIL MIGRAINE) 200 MG CAPS Take by mouth as needed.    [provider]  insulin aspart (NOVOLOG) 100 UNIT/ML injection INJECT UP TO 60 UNITS SUBCUTANEOUSLY DAILY PER  SLIDING  SCALE 04/11/18   Jerrol Banana., MD  insulin aspart (NOVOLOG) 100 UNIT/ML injection Per sliding scale: up to 60 units daily. DX E11.9 Patient not taking: Reported on 04/22/2019 04/14/18   Jerrol Banana., MD  Insulin Glargine, 2 Unit Dial, (TOUJEO MAX SOLOSTAR) 300 UNIT/ML SOPN Inject 6-7 Units into the skin at bedtime.    [provider]  LANTUS 100 UNIT/ML injection INJECT 10 UNITS SUBCUTANEOUSLY AT BEDTIME Patient not taking: Reported on 04/22/2019 10/30/17   Jerrol Banana., MD  levothyroxine (SYNTHROID) 50 MCG tablet Take 1 tablet by mouth once daily 08/12/19   Jerrol Banana., MD  loratadine (CLARITIN) 10 MG tablet Take 1 tablet (10 mg total) by mouth daily. 11/25/18   Jerrol Banana., MD  losartan  (COZAAR) 25 MG tablet Take 1 tablet by mouth once daily 08/12/19   Jerrol Banana., MD  ondansetron [facility] ODT) 4 MG disintegrating tablet Take 1 tablet (4 mg total) by mouth every 8 (eight) hours as needed for nausea or vomiting. 10/19/19   Volanda Napoleon, PA-C  topiramate (TOPAMAX) 50 MG tablet Take 3 tablets (150 mg total) by mouth daily. nightly 07/29/19  Jerrol Banana., MD    Allergies    Codeine and Sulfa antibiotics  Review of Systems   Review of Systems  Musculoskeletal:       Right ankle pain and swelling  Neurological: Negative for weakness and numbness.  All other systems reviewed and are negative.   Physical Exam Updated Vital Signs BP 131/63   Pulse 80   Temp 97.9 F (36.6 C) (Oral)   Resp 11   Ht _0  (1.549 m)   Wt 58.1 kg   SpO2 100%   BMI 24.19 kg/m   Physical Exam Vitals and nursing note reviewed.  Constitutional:      Appearance: She is well-developed.  HENT:     Head: Normocephalic and atraumatic.  Eyes:     General: No scleral icterus.       Right eye: No discharge.        Left eye: No discharge.     Conjunctiva/sclera: Conjunctivae normal.  Cardiovascular:     Pulses:          Dorsalis pedis pulses are detected w/ Doppler on the right side and 2+ on the left side.     Comments: Difficulty assessing palpable DP pulse on the right secondary to swelling.  Easily obtainable Doppler pulses noted. Pulmonary:     Effort: Pulmonary effort is normal.  Musculoskeletal:     Comments: Tenderness palpation noted to bilateral malleoli of right ankle with overlying soft tissue swelling that extends in the dorsal aspect of the foot.  There is overlying ecchymosis noted.  No tenderness palpation noted to proximal tib-fib, knee, hip.  No tenderness palpation of the left lower extremity.  Skin:    General: Skin is warm and dry.     Comments: Good distal cap refill. RLE is not dusky in appearance or cool to touch.  Neurological:     Mental  Status: She is alert.  Psychiatric:        Speech: Speech normal.        Behavior: Behavior normal.     ED Results / Procedures / Treatments   Labs (all labs ordered are listed, but only abnormal results are displayed) Labs Reviewed  CBG MONITORING, ED - Abnormal; Notable for the following components:      Result Value   Glucose-Capillary 149 (*)    All other components within normal limits    EKG None  Radiology DG Ankle Complete Right  Result Date: 10/19/2019 CLINICAL DATA:  Trimalleolar fracture-dislocation EXAM: RIGHT ANKLE - COMPLETE 3+ VIEW COMPARISON:  Same-day radiograph FINDINGS: Interval reduction of tibiotalar joint dislocation. Osseous alignment is now near anatomic. Improved alignment of the distal fibular fracture without significant residual displacement. Improved alignment of the posterior malleolar fracture which remains displaced by approximately 2-3 mm. Medial malleolar fracture is not well seen. There is overlying casting material. IMPRESSION: Improved alignment of trimalleolar fracture-dislocation status post reduction. Electronically Signed   By: Davina Poke D.O.   On: 10/19/2019 17:18   DG Ankle Complete Right  Result Date: 10/19/2019 CLINICAL DATA:  Right foot and ankle pain after fall EXAM: RIGHT FOOT COMPLETE - 3+ VIEW; RIGHT ANKLE - COMPLETE 3+ VIEW COMPARISON:  None. FINDINGS: Acute obliquely oriented fracture of the distal fibular metaphysis with slight valgus angulation. Nondisplaced medial malleolar fracture which appears predominantly transversely oriented. Posteriorly displaced and angulated fracture of the posterior malleolus. Talus is dislocated posteriorly relative to the tibial plafond. There is marked soft tissue swelling at the ankle. Question  nondisplaced fracture involving the base of the great toe proximal phalanx along its medial cortex. The bones appear mildly demineralized. Soft tissue swelling is also present at the level of the forefoot.  IMPRESSION: 1. Trimalleolar fracture-dislocation of the right ankle, as described. 2. Probable nondisplaced fracture involving the base of the great toe proximal phalanx along its medial cortex. 3. Soft tissue swelling of the ankle and forefoot. Electronically Signed   By: Davina Poke D.O.   On: 10/19/2019 15:01   CT ANKLE RIGHT WO CONTRAST  Result Date: 10/19/2019 CLINICAL DATA:  Ankle fracture status post reduction EXAM: CT OF THE RIGHT ANKLE WITHOUT CONTRAST TECHNIQUE: Multidetector CT imaging of the right ankle was performed according to the standard protocol. Multiplanar CT image reconstructions were also generated. COMPARISON:  Same-day radiograph FINDINGS: Bones/Joint/Cartilage Acute trimalleolar fractures of the right ankle: Spiral distal fibular metaphyseal fracture with 6 mm posterior displacement. Fracture extends into the distal tibiofibular joint and ankle mortise. Alignment of these joint spaces are near anatomic. Transversely oriented fracture of the medial malleolus without significant displacement. Vertically oriented fracture of the posterior malleolus with approximately 2-3 mm of posterior and proximal displacement resulting in diastasis and step-off of the articular surface of the posterior tibial plafond. Tibiotalar joint alignment is maintained without dislocation. Talar dome is intact. Alignment at the subtalar joints is anatomic. Alignment within the midfoot is anatomic without dislocation or widening. No fractures identified within the midfoot or hindfoot. Ligaments Suboptimally assessed by CT. Muscles and Tendons Peroneal tendons appear grossly intact without evidence to suggest tendon entrapment. Posteromedial ankle tendons appear grossly intact however fracture line closely approximates the traversing posterior tibialis and flexor digitorum longus tendons just proximal to the medial malleolus without definite entrapment. Anterior ankle tendons and Achilles tendon appear grossly  unremarkable. Soft tissues Marked circumferential soft tissue swelling about the ankle, most pronounced laterally. No soft tissue air to suggest open fracture. IMPRESSION: Trimalleolar right ankle fracture in near anatomic alignment status post reduction. Electronically Signed   By: Davina Poke D.O.   On: 10/19/2019 18:05   DG Foot Complete Right  Result Date: 10/19/2019 CLINICAL DATA:  Right foot and ankle pain after fall EXAM: RIGHT FOOT COMPLETE - 3+ VIEW; RIGHT ANKLE - COMPLETE 3+ VIEW COMPARISON:  None. FINDINGS: Acute obliquely oriented fracture of the distal fibular metaphysis with slight valgus angulation. Nondisplaced medial malleolar fracture which appears predominantly transversely oriented. Posteriorly displaced and angulated fracture of the posterior malleolus. Talus is dislocated posteriorly relative to the tibial plafond. There is marked soft tissue swelling at the ankle. Question nondisplaced fracture involving the base of the great toe proximal phalanx along its medial cortex. The bones appear mildly demineralized. Soft tissue swelling is also present at the level of the forefoot. IMPRESSION: 1. Trimalleolar fracture-dislocation of the right ankle, as described. 2. Probable nondisplaced fracture involving the base of the great toe proximal phalanx along its medial cortex. 3. Soft tissue swelling of the ankle and forefoot. Electronically Signed   By: Davina Poke D.O.   On: 10/19/2019 15:01    Procedures Reduction of dislocation  Date/Time: 10/19/2019 5:14 PM Performed by: Volanda Napoleon, PA-C Authorized by: Volanda Napoleon, PA-C  Consent: Verbal consent obtained. Consent given by: patient Patient understanding: patient states understanding of the procedure being performed Patient consent: the patient's understanding of the procedure matches consent given Procedure consent: procedure consent matches procedure scheduled Relevant documents: relevant documents present and  verified Test results: test results available and properly labeled  Site marked: the operative site was marked Imaging studies: imaging studies available Required items: required blood products, implants, devices, and special equipment available Patient identity confirmed: verbally with patient Time out: Immediately prior to procedure a "time out" was called to verify the correct patient, procedure, equipment, support staff and site/side marked as required. Patient tolerance: patient tolerated the procedure well with no immediate complications  .Splint Application  Date/Time: 10/19/2019 5:14 PM Performed by: Volanda Napoleon, PA-C Authorized by: Volanda Napoleon, PA-C   Consent:    Consent obtained:  Verbal   Consent given by:  Patient   Risks discussed:  Discoloration, numbness, pain and swelling Pre-procedure details:    Sensation:  Unchanged Procedure details:    Laterality:  Right   Location:  Ankle   Ankle:  R ankle   Splint type:  Short leg Post-procedure details:    Pain:  Improved   Sensation:  Normal   Skin color:  Cap refill <2 second   Patient tolerance of procedure:  Tolerated well, no immediate complications   (including critical care time)  Medications Ordered in ED Medications  fentaNYL (SUBLIMAZE) 100 MCG/2ML injection (has no administration in time range)  HYDROcodone-acetaminophen (NORCO/VICODIN) 5-325 MG per tablet 1 tablet (has no administration in time range)  propofol (DIPRIVAN) 10 mg/mL bolus/IV push 29.1 mg (29.1 mg Intravenous Given by Other 10/19/19 1648)  dextrose 50 % solution 25 mL (25 mLs Intravenous Given 10/19/19 1613)  propofol (DIPRIVAN) 10 mg/mL bolus/IV push (15 mg Intravenous Given 10/19/19 1656)  fentaNYL (SUBLIMAZE) injection 100 mcg (50 mcg Intravenous Given by Other 10/19/19 1653)  morphine 4 MG/ML injection 4 mg (4 mg Intravenous Given by Other 10/19/19 1713)  ondansetron (ZOFRAN) injection 4 mg (4 mg Intravenous Given by Other 10/19/19  1713)    ED Course  I have reviewed the triage vital signs and the nursing notes.  Pertinent labs & imaging results that were available during my care of the patient were reviewed by me and considered in my medical decision making (see chart for details).    MDM Rules/Calculators/A&P                      74 year old female who presents for evaluation of right ankle pain and swelling status post mechanical fall that occurred on 10/17/2019.  Inability to ambulate and bear weight since then.  Initially arrival, she is afebrile, nontoxic-appearing.  Vital signs are stable.  She is neurovascularly intact.  Concern for ankle fracture versus dislocation versus sprain.  Will plan for x-ray imaging.  XR shows trimalleolar fracture-dislocation of the right ankle.  Discussed patient with Dr. Griffin Basil (ortho). He would like [place] to try and reduce it here in the ED and get post reduction films. Plan for follow up in the outpatient office.   Reduction and splinting as documented below.  Please see separate attending note for sedation.  Reevaluation.  Patient with good distal sensation, cap refill.  Patient is alert and is able to answer questions fully.  I discussed with her red importance of crutches and nonweightbearing status.  We will give her pain medications in her home with.  She has tolerated hydrocodone in the past.  Additionally, encouraged at home supportive care measures.  Instructed to follow-up with orthopedics as directed. At this time, patient exhibits no emergent life-threatening condition that require further evaluation in ED or admission. Patient had ample opportunity for questions and discussion. All patient's questions were answered with full understanding. Strict  return precautions discussed. Patient expresses understanding and agreement to plan.   Portions of this note were generated with [employer]obby[employer]. Dictation errors may occur despite best attempts at proofreading.  Final  Clinical Impression(s) / ED Diagnoses Final diagnoses:  Closed trimalleolar fracture of left ankle, initial encounter    Rx / DC Orders ED Discharge Orders         Ordered    HYDROcodone-acetaminophen (NORCO/VICODIN) 5-325 MG tablet  Every 6 hours PRN     10/19/19 1820    ondansetron (ZOFRAN ODT) 4 MG disintegrating tablet  Every 8 hours PRN     10/19/19 1820           Desma Mcgregor 10/19/19 1949    Nat Christen, MD 10/25/19 0003

## 2019-10-19 NOTE — ED Notes (Signed)
Gave husband a update on Patient discharge time

## 2019-10-19 NOTE — ED Notes (Signed)
Pt took her blood sugar on her own meter and it read 92. Pt reports she felt like she needed to take some glucose tablets because she felt her sugar dropping and that is usual for this time of day for her. Dr. Lacinda Axon notified and order given to give 1/2 amp of D50.

## 2019-10-19 NOTE — ED Notes (Signed)
Pedal pulse noted with doppler 

## 2019-10-21 ENCOUNTER — Encounter (HOSPITAL_BASED_OUTPATIENT_CLINIC_OR_DEPARTMENT_OTHER): Payer: Self-pay | Admitting: Orthopaedic Surgery

## 2019-10-21 ENCOUNTER — Other Ambulatory Visit (HOSPITAL_COMMUNITY)
Admission: RE | Admit: 2019-10-21 | Discharge: 2019-10-21 | Disposition: A | Discharge status: Home / Self Care | Payer: Medicare HMO | Source: Ambulatory Visit | Attending: Orthopaedic Surgery | Admitting: Orthopaedic Surgery

## 2019-10-21 ENCOUNTER — Other Ambulatory Visit: Payer: Self-pay

## 2019-10-21 DIAGNOSIS — Z20822 Contact with and (suspected) exposure to covid-19: Secondary | ICD-10-CM | POA: Diagnosis not present

## 2019-10-21 DIAGNOSIS — Z01812 Encounter for preprocedural laboratory examination: Secondary | ICD-10-CM | POA: Diagnosis not present

## 2019-10-21 DIAGNOSIS — M25571 Pain in right ankle and joints of right foot: Secondary | ICD-10-CM | POA: Diagnosis not present

## 2019-10-21 NOTE — H&P (Signed)
PREOPERATIVE H&P  Chief Complaint: RIGHT ANKLE FRACTURE  HPI: Debbie Dennis is a 74 y.o. female who is scheduled for OPEN REDUCTION INTERNAL FIXATION (ORIF) TRIMALLEOLAR ANKLE FRACTURE.   Patient has a past medical history significant for hypothyroidism, hypertension, GERD, Insulin-dependent diabetes, complications of anesthesia, and anxiety.   Patient was walking in her house and  her foot got caught up on a chair, causing her to twist her ankle. This was on 10/17/2019. She states that since then, she has had difficulty ambulating and bearing weight on her right leg. She had immediate pain since the incidence. She went to [facility] ED on 10/19/2019. X-rays showed trimalleolar fracture-dislocation of the right ankle. Ankle was successfully reduced in the ED. She was splinted and told to follow-up with orthopedics. She has been non-weight bearing since she has been seen in the ED. She notes pain and swelling of her right foot.  Her symptoms are rated as moderate to severe, and have been worsening.  This is significantly impairing activities of daily living.    Please see clinic note for further details on this patient's care.    She has elected for surgical management.   Past Medical History:  Diagnosis Date  . Adaptive colitis   . Anxiety   . Arthritis   . Bronchitis    CHRONIC  . Buedinger-Ludloff-Laewen disease   . Complication of anesthesia    hard to put to sleep-needs more meds  . Diabetes mellitus without complication (HCC)    IDDM  . GERD (gastroesophageal reflux disease)   . Headache    MIGRAINES  . Hypertension   . Hypothyroidism   . Restless leg syndrome    Past Surgical History:  Procedure Laterality Date  . ABDOMINAL HYSTERECTOMY     has ovaries-due to abnormal pap smears  . BREAST EXCISIONAL BIOPSY Left 1988  . BREAST SURGERY     lumpectomy  . CATARACT EXTRACTION W/PHACO Left 01/20/2016   Procedure: CATARACT EXTRACTION PHACO AND INTRAOCULAR LENS PLACEMENT  (IOC);  Surgeon: Leandrew Koyanagi, MD;  Location: [facility] ORS;  Service: Ophthalmology;  Laterality: Left;  Lot # [id number] H US:02:01:5 AP:20.2% CDE: 24.51  . CATARACT EXTRACTION W/PHACO Right 11/28/2016   Procedure: CATARACT EXTRACTION PHACO AND INTRAOCULAR LENS PLACEMENT (IOC);  Surgeon: Leandrew Koyanagi, MD;  Location: [facility] ORS;  Service: Ophthalmology;  Laterality: Right;  [place] 01:01 AP% 18.3 CDE 11.26 Fluid pack lot # [id number]  . COLONOSCOPY WITH PROPOFOL N/A 05/14/2015   Procedure: COLONOSCOPY WITH PROPOFOL;  Surgeon: Josefine Class, MD;  Location[facility] ENDOSCOPY;  Service: Endoscopy;  Laterality: N/A;  . EYE SURGERY     cataract left   Social History   Socioeconomic History  . Marital status: Married    Spouse name: Sonia Side  . Number of children: 1  . Years of education: 49  . Highest education level: 12th grade  Occupational History  . Occupation: retired  Tobacco Use  . Smoking status: Former Smoker    Packs/day: 1.00    Years: 35.00    Pack years: 35.00    Types: Cigarettes    Quit date: 10/02/2012    Years since quitting: 7.0  . Smokeless tobacco: Never Used  Substance and Sexual Activity  . Alcohol use: No  . Drug use: No  . Sexual activity: Not Currently    Birth control/protection: None, Surgical  Other Topics Concern  . Not on file  Social History Narrative  . Not on file   Social Determinants of  Health   Financial Resource Strain:   . Difficulty of Paying Living Expenses: Not on file  Food Insecurity:   . Worried About Charity fundraiser in the Last Year: Not on file  . Ran Out of Food in the Last Year: Not on file  Transportation Needs:   . Lack of Transportation (Medical): Not on file  . Lack of Transportation (Non-Medical): Not on file  Physical Activity: Inactive  . Days of Exercise per Week: 0 days  . Minutes of Exercise per Session: 0 min  Stress: Stress Concern Present  . Feeling of Stress : To some extent  Social Connections: Unknown   . Frequency of Communication with Friends and Family: Patient refused  . Frequency of Social Gatherings with Friends and Family: Patient refused  . Attends Religious Services: Patient refused  . Active Member of Clubs or Organizations: Patient refused  . Attends Archivist Meetings: Patient refused  . Marital Status: Patient refused   Family History  Problem Relation Age of Onset  . Cancer Mother        spread into her lungs-died from heart issues   . Heart attack Mother   . COPD Father   . Throat cancer Father   . Heart disease Father   . Arthritis Sister   . Breast cancer Sister 88  . Lung cancer Brother   . Asthma Maternal Grandfather    Allergies  Allergen Reactions  . Codeine Nausea And Vomiting  . Sulfa Antibiotics Rash   Prior to Admission medications   Medication Sig Start Date End Date Taking? Authorizing Provider  alendronate (FOSAMAX) 70 MG tablet Take 1 tablet (70 mg total) by mouth every 7 (seven) days. Take with a full glass of water on an empty stomach. 06/27/19  Yes Jerrol Banana., MD  ALPRAZolam Duanne Moron) 0.5 MG tablet Take 1 tablet (0.5 mg total) by mouth 2 (two) times daily as needed for anxiety. 05/28/19  Yes Jerrol Banana., MD  Cholecalciferol (VITAMIN D) 50 MCG (2000 UT) tablet Take 2,000 Units by mouth daily.   Yes [provider]  fluticasone (FLONASE) 50 MCG/ACT nasal spray Place into both nostrils daily.    Yes [provider]  gabapentin (NEURONTIN) 100 MG capsule TAKE 1 CAPSULE BY MOUTH THREE TIMES DAILY 10/13/19  Yes Jerrol Banana., MD  Ibuprofen (ADVIL MIGRAINE) 200 MG CAPS Take by mouth as needed.   Yes [provider]  insulin aspart (NOVOLOG) 100 UNIT/ML injection INJECT UP TO 60 UNITS SUBCUTANEOUSLY DAILY PER  SLIDING  SCALE 04/11/18  Yes Jerrol Banana., MD  Insulin Glargine, 2 Unit Dial, (TOUJEO MAX SOLOSTAR) 300 UNIT/ML SOPN Inject 6-7 Units into the skin at bedtime.   Yes [provider]  levothyroxine (SYNTHROID) 50 MCG tablet Take 1 tablet by mouth once daily 08/12/19  Yes Jerrol Banana., MD  loratadine (CLARITIN) 10 MG tablet Take 1 tablet (10 mg total) by mouth daily. 11/25/18  Yes Jerrol Banana., MD  losartan (COZAAR) 25 MG tablet Take 1 tablet by mouth once daily 08/12/19  Yes Jerrol Banana., MD  topiramate (TOPAMAX) 50 MG tablet Take 3 tablets (150 mg total) by mouth daily. nightly 07/29/19  Yes Jerrol Banana., MD  Continuous Blood Gluc Receiver (FREESTYLE LIBRE 14 DAY READER) DEVI Use 1 kit every 14 (fourteen) days 01/17/19   [provider]  HYDROcodone-acetaminophen (NORCO/VICODIN) 5-325 MG tablet Take 2 tablets by  mouth every 6 (six) hours as needed. 10/19/19   Volanda Napoleon, PA-C  ondansetron (ZOFRAN ODT) 4 MG disintegrating tablet Take 1 tablet (4 mg total) by mouth every 8 (eight) hours as needed for nausea or vomiting. 10/19/19   Volanda Napoleon, PA-C    ROS: All other systems have been reviewed and were otherwise negative with the exception of those mentioned in the HPI and as above.  Physical Exam: General: Alert, no acute distress Cardiovascular: No pedal edema Respiratory: No cyanosis, no use of accessory musculature GI: No organomegaly, abdomen is soft and non-tender Skin: No lesions in the area of chief complaint Neurologic: Sensation intact distally Psychiatric: Patient is competent for consent with normal mood and affect Lymphatic: No axillary or cervical lymphadenopathy  MUSCULOSKELETAL:  Right ankle: swelling of the right ankle and foot, she is able to wiggle toes, tender to palpation about latweral and medial malleolus, ROM not tested in setting of known fracture, neurovascularly intact  Imaging: CT of right ankle:  "Spiral distal fibular metaphyseal fracture with 6 mm posterior displacement. Fracture extends into the distal tibiofibular joint and ankle mortise. Alignment of these joint  spaces are near anatomic.  Transversely oriented fracture of the medial malleolus without significant displacement.  Vertically oriented fracture of the posterior malleolus with approximately 2-3 mm of posterior and proximal displacement resulting in diastasis and step-off of the articular surface of the posterior tibial plafond.  Tibiotalar joint alignment is maintained without dislocation. Talar dome is intact. Alignment at the subtalar joints is anatomic. Alignment within the midfoot is anatomic without dislocation or widening. No fractures identified within the midfoot or hindfoot."  Assessment: RIGHT ANKLE FRACTURE  Plan: Plan for Procedure(s): OPEN REDUCTION INTERNAL FIXATION (ORIF) TRIMALLEOLAR ANKLE FRACTURE  The risks benefits and alternatives were discussed with the patient including but not limited to the risks of nonoperative treatment, versus surgical intervention including infection, bleeding, nerve injury,  blood clots, cardiopulmonary complications, morbidity, mortality, among others, and they were willing to proceed.   The patient acknowledged the explanation, agreed to proceed with the plan and consent was signed.   Operative Plan: ORIF trimalleolar ankle fracture  Discharge Medications: Tylenol, Celebrex, Oxycodone, Zofran DVT Prophylaxis: Aspirin Physical Therapy: +/- outpatient PT Special Discharge needs: Splint   Ethelda Chick, PA-C  10/21/2019 5:18 PM

## 2019-10-22 LAB — NOVEL CORONAVIRUS, NAA (HOSP ORDER, SEND-OUT TO REF LAB; TAT 18-24 HRS): SARS-CoV-2, NAA: NOT DETECTED

## 2019-10-24 ENCOUNTER — Ambulatory Visit (HOSPITAL_BASED_OUTPATIENT_CLINIC_OR_DEPARTMENT_OTHER): Payer: Medicare HMO | Admitting: Anesthesiology

## 2019-10-24 ENCOUNTER — Ambulatory Visit (HOSPITAL_BASED_OUTPATIENT_CLINIC_OR_DEPARTMENT_OTHER)
Admission: RE | Admit: 2019-10-24 | Discharge: 2019-10-24 | Disposition: A | Discharge status: Home / Self Care | Payer: Medicare HMO | Attending: Orthopaedic Surgery | Admitting: Orthopaedic Surgery

## 2019-10-24 ENCOUNTER — Encounter (HOSPITAL_BASED_OUTPATIENT_CLINIC_OR_DEPARTMENT_OTHER)
Admission: RE | Disposition: A | Discharge status: Home / Self Care | Payer: Self-pay | Source: Home / Self Care | Attending: Orthopaedic Surgery

## 2019-10-24 ENCOUNTER — Other Ambulatory Visit: Payer: Self-pay

## 2019-10-24 ENCOUNTER — Encounter (HOSPITAL_BASED_OUTPATIENT_CLINIC_OR_DEPARTMENT_OTHER): Payer: Self-pay | Admitting: Orthopaedic Surgery

## 2019-10-24 DIAGNOSIS — I10 Essential (primary) hypertension: Secondary | ICD-10-CM | POA: Insufficient documentation

## 2019-10-24 DIAGNOSIS — J449 Chronic obstructive pulmonary disease, unspecified: Secondary | ICD-10-CM | POA: Diagnosis not present

## 2019-10-24 DIAGNOSIS — Y92009 Unspecified place in unspecified non-institutional (private) residence as the place of occurrence of the external cause: Secondary | ICD-10-CM | POA: Diagnosis not present

## 2019-10-24 DIAGNOSIS — Z885 Allergy status to narcotic agent status: Secondary | ICD-10-CM | POA: Insufficient documentation

## 2019-10-24 DIAGNOSIS — Z882 Allergy status to sulfonamides status: Secondary | ICD-10-CM | POA: Diagnosis not present

## 2019-10-24 DIAGNOSIS — X501XXA Overexertion from prolonged static or awkward postures, initial encounter: Secondary | ICD-10-CM | POA: Insufficient documentation

## 2019-10-24 DIAGNOSIS — Z87891 Personal history of nicotine dependence: Secondary | ICD-10-CM | POA: Diagnosis not present

## 2019-10-24 DIAGNOSIS — E785 Hyperlipidemia, unspecified: Secondary | ICD-10-CM | POA: Diagnosis not present

## 2019-10-24 DIAGNOSIS — Z794 Long term (current) use of insulin: Secondary | ICD-10-CM | POA: Insufficient documentation

## 2019-10-24 DIAGNOSIS — E039 Hypothyroidism, unspecified: Secondary | ICD-10-CM | POA: Diagnosis not present

## 2019-10-24 DIAGNOSIS — F419 Anxiety disorder, unspecified: Secondary | ICD-10-CM | POA: Diagnosis not present

## 2019-10-24 DIAGNOSIS — G43909 Migraine, unspecified, not intractable, without status migrainosus: Secondary | ICD-10-CM | POA: Insufficient documentation

## 2019-10-24 DIAGNOSIS — Z79890 Hormone replacement therapy (postmenopausal): Secondary | ICD-10-CM | POA: Diagnosis not present

## 2019-10-24 DIAGNOSIS — M1990 Unspecified osteoarthritis, unspecified site: Secondary | ICD-10-CM | POA: Insufficient documentation

## 2019-10-24 DIAGNOSIS — S82851A Displaced trimalleolar fracture of right lower leg, initial encounter for closed fracture: Secondary | ICD-10-CM | POA: Insufficient documentation

## 2019-10-24 DIAGNOSIS — Z8261 Family history of arthritis: Secondary | ICD-10-CM | POA: Insufficient documentation

## 2019-10-24 DIAGNOSIS — Z8249 Family history of ischemic heart disease and other diseases of the circulatory system: Secondary | ICD-10-CM | POA: Diagnosis not present

## 2019-10-24 DIAGNOSIS — E1051 Type 1 diabetes mellitus with diabetic peripheral angiopathy without gangrene: Secondary | ICD-10-CM | POA: Diagnosis not present

## 2019-10-24 DIAGNOSIS — Z79899 Other long term (current) drug therapy: Secondary | ICD-10-CM | POA: Insufficient documentation

## 2019-10-24 DIAGNOSIS — G8918 Other acute postprocedural pain: Secondary | ICD-10-CM | POA: Diagnosis not present

## 2019-10-24 HISTORY — PX: ORIF ANKLE FRACTURE: SHX5408

## 2019-10-24 HISTORY — DX: Other complications of anesthesia, initial encounter: T88.59XA

## 2019-10-24 LAB — BASIC METABOLIC PANEL
Anion gap: 13 (ref 5–15)
BUN: 14 mg/dL (ref 8–23)
CO2: 18 mmol/L — ABNORMAL LOW (ref 22–32)
Calcium: 9 mg/dL (ref 8.9–10.3)
Chloride: 110 mmol/L (ref 98–111)
Creatinine, Ser: 1.78 mg/dL — ABNORMAL HIGH (ref 0.44–1.00)
GFR calc Af Amer: 32 mL/min — ABNORMAL LOW (ref >60)
GFR calc non Af Amer: 28 mL/min — ABNORMAL LOW (ref >60)
Glucose, Bld: 278 mg/dL — ABNORMAL HIGH (ref 70–99)
Potassium: 3.5 mmol/L (ref 3.5–5.1)
Sodium: 141 mmol/L (ref 135–145)

## 2019-10-24 LAB — GLUCOSE, CAPILLARY: Glucose-Capillary: 212 mg/dL — ABNORMAL HIGH (ref 70–99)

## 2019-10-24 SURGERY — OPEN REDUCTION INTERNAL FIXATION (ORIF) ANKLE FRACTURE
Anesthesia: General | Site: Ankle | Laterality: Right

## 2019-10-24 MED ORDER — OXYCODONE HCL 5 MG PO TABS: 5.0000 mg | ORAL_TABLET | Freq: Once | ORAL | Status: DC | PRN

## 2019-10-24 MED ORDER — MIDAZOLAM HCL 2 MG/2ML IJ SOLN
INTRAMUSCULAR | Status: AC
  Filled 2019-10-24: qty 2

## 2019-10-24 MED ORDER — CEFAZOLIN SODIUM-DEXTROSE 2-4 GM/100ML-% IV SOLN
2.0000 g | INTRAVENOUS | Status: AC
  Administered 2019-10-24: 13:00:00 2 g via INTRAVENOUS

## 2019-10-24 MED ORDER — LIDOCAINE 2% (20 MG/ML) 5 ML SYRINGE
INTRAMUSCULAR | Status: DC | PRN
  Administered 2019-10-24: 40 mg via INTRAVENOUS

## 2019-10-24 MED ORDER — CEFAZOLIN SODIUM-DEXTROSE 2-4 GM/100ML-% IV SOLN
INTRAVENOUS | Status: AC
  Filled 2019-10-24: qty 100

## 2019-10-24 MED ORDER — PROPOFOL 10 MG/ML IV BOLUS
INTRAVENOUS | Status: DC | PRN
  Administered 2019-10-24: 120 mg via INTRAVENOUS
  Administered 2019-10-24: 30 mg via INTRAVENOUS

## 2019-10-24 MED ORDER — DEXAMETHASONE SODIUM PHOSPHATE 10 MG/ML IJ SOLN
INTRAMUSCULAR | Status: AC
  Filled 2019-10-24: qty 1

## 2019-10-24 MED ORDER — CELECOXIB 200 MG PO CAPS: 200.0000 mg | ORAL_CAPSULE | Freq: Two times a day (BID) | ORAL | 1 refills | Status: AC

## 2019-10-24 MED ORDER — LACTATED RINGERS IV SOLN: INTRAVENOUS | Status: DC

## 2019-10-24 MED ORDER — ACETAMINOPHEN 500 MG PO TABS: 1000.0000 mg | ORAL_TABLET | Freq: Three times a day (TID) | ORAL | 0 refills | Status: AC

## 2019-10-24 MED ORDER — OXYCODONE HCL 5 MG/5ML PO SOLN: 5.0000 mg | Freq: Once | ORAL | Status: DC | PRN

## 2019-10-24 MED ORDER — HYDROMORPHONE HCL 1 MG/ML IJ SOLN: 0.2500 mg | INTRAMUSCULAR | Status: DC | PRN

## 2019-10-24 MED ORDER — FENTANYL CITRATE (PF) 100 MCG/2ML IJ SOLN
INTRAMUSCULAR | Status: AC
  Filled 2019-10-24: qty 2

## 2019-10-24 MED ORDER — ONDANSETRON HCL 4 MG/2ML IJ SOLN
INTRAMUSCULAR | Status: DC | PRN
  Administered 2019-10-24: 4 mg via INTRAVENOUS

## 2019-10-24 MED ORDER — FENTANYL CITRATE (PF) 100 MCG/2ML IJ SOLN
50.0000 ug | INTRAMUSCULAR | Status: DC | PRN
  Administered 2019-10-24: 50 ug via INTRAVENOUS

## 2019-10-24 MED ORDER — VANCOMYCIN HCL 1000 MG IV SOLR
INTRAVENOUS | Status: DC | PRN
  Administered 2019-10-24: 1000 mg

## 2019-10-24 MED ORDER — ONDANSETRON HCL 4 MG/2ML IJ SOLN
INTRAMUSCULAR | Status: AC
  Filled 2019-10-24: qty 2

## 2019-10-24 MED ORDER — OXYCODONE HCL 5 MG PO TABS: ORAL_TABLET | ORAL | 0 refills | Status: AC

## 2019-10-24 MED ORDER — FENTANYL CITRATE (PF) 100 MCG/2ML IJ SOLN
INTRAMUSCULAR | Status: DC | PRN
  Administered 2019-10-24 (×2): 25 ug via INTRAVENOUS

## 2019-10-24 MED ORDER — DEXAMETHASONE SODIUM PHOSPHATE 10 MG/ML IJ SOLN
INTRAMUSCULAR | Status: DC | PRN
  Administered 2019-10-24: 5 mg via INTRAVENOUS

## 2019-10-24 MED ORDER — ROPIVACAINE HCL 5 MG/ML IJ SOLN
INTRAMUSCULAR | Status: DC | PRN
  Administered 2019-10-24: 50 mL via PERINEURAL

## 2019-10-24 MED ORDER — CHLORHEXIDINE GLUCONATE 4 % EX LIQD: 60.0000 mL | Freq: Once | CUTANEOUS | Status: DC

## 2019-10-24 MED ORDER — MIDAZOLAM HCL 2 MG/2ML IJ SOLN
1.0000 mg | INTRAMUSCULAR | Status: DC | PRN
  Administered 2019-10-24: 11:00:00 2 mg via INTRAVENOUS

## 2019-10-24 MED ORDER — PROMETHAZINE HCL 25 MG/ML IJ SOLN: 6.2500 mg | INTRAMUSCULAR | Status: DC | PRN

## 2019-10-24 MED ORDER — ONDANSETRON HCL 4 MG PO TABS: 4.0000 mg | ORAL_TABLET | Freq: Three times a day (TID) | ORAL | 1 refills | Status: AC | PRN

## 2019-10-24 SURGICAL SUPPLY — 91 items
BENZOIN TINCTURE PRP APPL 2/3 (GAUZE/BANDAGES/DRESSINGS) IMPLANT
BIT DRILL 2 CANN GRADUATED (BIT) ×3 IMPLANT
BIT DRILL 2.5 CANN LNG (BIT) ×3 IMPLANT
BIT DRILL 2.5 CANN STRL (BIT) ×3 IMPLANT
BIT DRILL 2.6 CANN (BIT) ×3 IMPLANT
BLADE SURG 10 STRL SS (BLADE) ×3 IMPLANT
BLADE SURG 15 STRL LF DISP TIS (BLADE) ×2 IMPLANT
BLADE SURG 15 STRL SS (BLADE) ×4
BNDG COHESIVE 4X5 TAN STRL (GAUZE/BANDAGES/DRESSINGS) ×3 IMPLANT
BNDG ELASTIC 4X5.8 VLCR STR LF (GAUZE/BANDAGES/DRESSINGS) ×3 IMPLANT
BNDG ELASTIC 6X5.8 VLCR STR LF (GAUZE/BANDAGES/DRESSINGS) ×3 IMPLANT
BNDG ESMARK 4X9 LF (GAUZE/BANDAGES/DRESSINGS) ×3 IMPLANT
CLEANER CAUTERY TIP 5X5 PAD (MISCELLANEOUS) ×1 IMPLANT
CLOSURE STERI-STRIP 1/2X4 (GAUZE/BANDAGES/DRESSINGS) ×1
CLSR STERI-STRIP ANTIMIC 1/2X4 (GAUZE/BANDAGES/DRESSINGS) ×2 IMPLANT
COVER BACK TABLE 60X90IN (DRAPES) ×3 IMPLANT
COVER WAND RF STERILE (DRAPES) IMPLANT
CUFF TOURN SGL QUICK 24 (TOURNIQUET CUFF)
CUFF TOURN SGL QUICK 34 (TOURNIQUET CUFF) ×2
CUFF TRNQT CYL 24X4X16.5-23 (TOURNIQUET CUFF) IMPLANT
CUFF TRNQT CYL 34X4.125X (TOURNIQUET CUFF) ×1 IMPLANT
DECANTER SPIKE VIAL GLASS SM (MISCELLANEOUS) IMPLANT
DRAPE EXTREMITY T 121X128X90 (DISPOSABLE) ×3 IMPLANT
DRAPE IMP U-DRAPE 54X76 (DRAPES) ×3 IMPLANT
DRAPE OEC MINIVIEW 54X84 (DRAPES) ×3 IMPLANT
DRAPE U-SHAPE 47X51 STRL (DRAPES) ×3 IMPLANT
DRSG PAD ABDOMINAL 8X10 ST (GAUZE/BANDAGES/DRESSINGS) ×3 IMPLANT
DURAPREP 26ML APPLICATOR (WOUND CARE) ×3 IMPLANT
ELECT REM PT RETURN 9FT ADLT (ELECTROSURGICAL) ×3
ELECTRODE REM PT RTRN 9FT ADLT (ELECTROSURGICAL) ×1 IMPLANT
GAUZE SPONGE 4X4 12PLY STRL (GAUZE/BANDAGES/DRESSINGS) ×3 IMPLANT
GLOVE BIO SURGEON STRL SZ 6.5 (GLOVE) ×4 IMPLANT
GLOVE BIO SURGEON STRL SZ8 (GLOVE) ×3 IMPLANT
GLOVE BIO SURGEONS STRL SZ 6.5 (GLOVE) ×2
GLOVE BIOGEL PI IND STRL 6.5 (GLOVE) ×1 IMPLANT
GLOVE BIOGEL PI IND STRL 7.0 (GLOVE) ×1 IMPLANT
GLOVE BIOGEL PI IND STRL 8 (GLOVE) ×2 IMPLANT
GLOVE BIOGEL PI INDICATOR 6.5 (GLOVE) ×2
GLOVE BIOGEL PI INDICATOR 7.0 (GLOVE) ×2
GLOVE BIOGEL PI INDICATOR 8 (GLOVE) ×4
GLOVE ECLIPSE 8.0 STRL XLNG CF (GLOVE) ×3 IMPLANT
GOWN STRL REUS W/ TWL LRG LVL3 (GOWN DISPOSABLE) ×1 IMPLANT
GOWN STRL REUS W/TWL LRG LVL3 (GOWN DISPOSABLE) ×2
GOWN STRL REUS W/TWL XL LVL3 (GOWN DISPOSABLE) ×3 IMPLANT
GUIDEWIRE 1.35MM (WIRE) ×3 IMPLANT
GUIDEWIRE 1.6 (WIRE) ×2
GUIDEWIRE ORTH 157X1.6XTROC (WIRE) ×1 IMPLANT
NEEDLE HYPO 22GX1.5 SAFETY (NEEDLE) IMPLANT
NS IRRIG 1000ML POUR BTL (IV SOLUTION) ×3 IMPLANT
PACK BASIN DAY SURGERY FS (CUSTOM PROCEDURE TRAY) ×3 IMPLANT
PAD CAST 4YDX4 CTTN HI CHSV (CAST SUPPLIES) ×1 IMPLANT
PAD CLEANER CAUTERY TIP 5X5 (MISCELLANEOUS) ×2
PADDING CAST ABS 4INX4YD NS (CAST SUPPLIES) ×4
PADDING CAST ABS COTTON 4X4 ST (CAST SUPPLIES) ×2 IMPLANT
PADDING CAST COTTON 4X4 STRL (CAST SUPPLIES) ×2
PADDING CAST COTTON 6X4 STRL (CAST SUPPLIES) ×3 IMPLANT
PENCIL SMOKE EVACUATOR (MISCELLANEOUS) ×3 IMPLANT
PLATE 5HOLE LOCKING 91MML (Plate) ×3 IMPLANT
SCREW CORT NL FMS 3.5X45 (Screw) ×3 IMPLANT
SCREW LOCK T15 FT 14X3.5XST (Screw) ×2 IMPLANT
SCREW LOCKING 2.7X10 ANKLE (Screw) ×3 IMPLANT
SCREW LOCKING 2.7X12 ANKLE (Screw) ×3 IMPLANT
SCREW LOCKING 2.7X14MM (Screw) ×9 IMPLANT
SCREW LOCKING 2.7X16MM (Screw) ×3 IMPLANT
SCREW LOCKING 3.5X12MM (Screw) ×3 IMPLANT
SCREW LOCKING 3.5X14MM (Screw) ×4 IMPLANT
SCREW LOW PROFILE 3.5X14 (Screw) ×3 IMPLANT
SCREW LOW PROFILE 3.5X16 (Screw) ×3 IMPLANT
SCREW LOW PROFILE CANN 4.0X45 (Screw) ×6 IMPLANT
SLEEVE SCD COMPRESS KNEE MED (MISCELLANEOUS) ×3 IMPLANT
SPLINT FAST PLASTER 5X30 (CAST SUPPLIES) ×40
SPLINT PLASTER CAST FAST 5X30 (CAST SUPPLIES) ×20 IMPLANT
SPONGE LAP 18X18 RF (DISPOSABLE) ×3 IMPLANT
SUCTION FRAZIER HANDLE 10FR (MISCELLANEOUS) ×2
SUCTION TUBE FRAZIER 10FR DISP (MISCELLANEOUS) ×1 IMPLANT
SUT MNCRL AB 4-0 PS2 18 (SUTURE) ×6 IMPLANT
SUT MON AB 3-0 SH 27 (SUTURE)
SUT MON AB 3-0 SH27 (SUTURE) IMPLANT
SUT VIC AB 0 CT1 27 (SUTURE) ×2
SUT VIC AB 0 CT1 27XBRD ANBCTR (SUTURE) ×1 IMPLANT
SUT VIC AB 3-0 SH 27 (SUTURE) ×4
SUT VIC AB 3-0 SH 27X BRD (SUTURE) ×2 IMPLANT
SYR BULB 3OZ (MISCELLANEOUS) ×3 IMPLANT
SYR CONTROL 10ML LL (SYRINGE) IMPLANT
TOWEL GREEN STERILE FF (TOWEL DISPOSABLE) ×6 IMPLANT
TUBE CONNECTING 20'X1/4 (TUBING) ×1
TUBE CONNECTING 20X1/4 (TUBING) ×2 IMPLANT
UNDERPAD 30X36 HEAVY ABSORB (UNDERPADS AND DIAPERS) ×3 IMPLANT
WASHER (Orthopedic Implant) ×4 IMPLANT
WASHER ORTHO 7X (Orthopedic Implant) ×2 IMPLANT
YANKAUER SUCT BULB TIP NO VENT (SUCTIONS) ×3 IMPLANT

## 2019-10-24 NOTE — Anesthesia Procedure Notes (Signed)
Anesthesia Regional Block: Popliteal block   Pre-Anesthetic Checklist: ,, timeout performed, Correct Patient, Correct Site, Correct Laterality, Correct Procedure, Correct Position, site marked, Risks and benefits discussed,  Surgical consent,  Pre-op evaluation,  At surgeon's request and post-op pain management  Laterality: Right  Prep: chloraprep       Needles:  Injection technique: Single-shot  Needle Type: Stimiplex     Needle Length: 9cm  Needle Gauge: 21     Additional Needles:   Procedures:,,,, ultrasound used (permanent image in chart),,,,  Narrative:  Start time: 10/24/2019 11:22 AM End time: 10/24/2019 11:27 AM Injection made incrementally with aspirations every 5 mL.  Performed by: Personally  Anesthesiologist: Lynda Rainwater, MD

## 2019-10-24 NOTE — Progress Notes (Signed)
Assisted Dr. Sabra Heck with right, ultrasound guided, popliteal, adductor canal block. Side rails up, monitors on throughout procedure. See vital signs in flow sheet. Tolerated Procedure well.

## 2019-10-24 NOTE — Anesthesia Procedure Notes (Signed)
Anesthesia Regional Block: Adductor canal block   Pre-Anesthetic Checklist: ,, timeout performed, Correct Patient, Correct Site, Correct Laterality, Correct Procedure, Correct Position, site marked, Risks and benefits discussed,  Surgical consent,  Pre-op evaluation,  At surgeon's request and post-op pain management  Laterality: Right  Prep: chloraprep       Needles:  Injection technique: Single-shot  Needle Type: Stimiplex     Needle Length: 9cm  Needle Gauge: 21     Additional Needles:   Procedures:,,,, ultrasound used (permanent image in chart),,,,  Narrative:  Start time: 10/24/2019 11:24 AM End time: 10/24/2019 11:26 AM Injection made incrementally with aspirations every 5 mL.  Performed by: Personally  Anesthesiologist: Lynda Rainwater, MD

## 2019-10-24 NOTE — Transfer of Care (Signed)
Immediate Anesthesia Transfer of Care Note  Patient: Debbie Dennis  Procedure(s) Performed: OPEN REDUCTION INTERNAL FIXATION (ORIF) TRIMALLEOLAR ANKLE FRACTURE (Right Ankle)  Patient Location: PACU  Anesthesia Type:GA combined with regional for post-op pain  Level of Consciousness: awake, alert , oriented and patient cooperative  Airway & Oxygen Therapy: Patient Spontanous Breathing and Patient connected to face mask oxygen  Post-op Assessment: Report given to RN and Post -op Vital signs reviewed and stable  Post vital signs: Reviewed and stable  Last Vitals:  Vitals Value Taken Time  BP    Temp    Pulse 88 10/24/19 1414  Resp    SpO2 100 % 10/24/19 1414  Vitals shown include unvalidated device data.  Last Pain:  Vitals:   10/24/19 1029  TempSrc: Oral  PainSc: 6       Patients Stated Pain Goal: 3 ([id number])  Complications: No apparent anesthesia complications

## 2019-10-24 NOTE — Anesthesia Procedure Notes (Signed)
Procedure Name: LMA Insertion Date/Time: 10/24/2019 12:41 PM Performed by: Raenette Rover, CRNA Pre-anesthesia Checklist: Patient identified, Emergency Drugs available, Suction available and Patient being monitored Patient Re-evaluated:Patient Re-evaluated prior to induction Oxygen Delivery Method: Circle system utilized Preoxygenation: Pre-oxygenation with 100% oxygen Induction Type: IV induction LMA: LMA inserted LMA Size: 4.0 Number of attempts: 1 Placement Confirmation: positive ETCO2 and breath sounds checked- equal and bilateral Tube secured with: Tape Dental Injury: Teeth and Oropharynx as per pre-operative assessment

## 2019-10-24 NOTE — Discharge Instructions (Signed)

## 2019-10-24 NOTE — Interval H&P Note (Signed)
History and Physical Interval Note:  10/24/2019 12:13 PM  Debbie Dennis  has presented today for surgery, with the diagnosis of RIGHT ANKLE FRACTURE.  The various methods of treatment have been discussed with the patient and family. After consideration of risks, benefits and other options for treatment, the patient has consented to  Procedure(s): OPEN REDUCTION INTERNAL FIXATION (ORIF) TRIMALLEOLAR ANKLE FRACTURE (Right) as a surgical intervention.  The patient's history has been reviewed, patient examined, no change in status, stable for surgery.  I have reviewed the patient's chart and labs.  Questions were answered to the patient's satisfaction.     Hiram Gash

## 2019-10-24 NOTE — Anesthesia Preprocedure Evaluation (Signed)
Anesthesia Evaluation  Patient identified by MRN, date of birth, ID band Patient awake    Reviewed: Allergy & Precautions, NPO status , Patient's Chart, lab work & pertinent test results  History of Anesthesia Complications Negative for: history of anesthetic complications  Airway Mallampati: II  TM Distance: >3 FB Neck ROM: Full    Dental no notable dental hx.    Pulmonary COPD, former smoker,    Pulmonary exam normal breath sounds clear to auscultation       Cardiovascular hypertension, Pt. on medications + Peripheral Vascular Disease  Normal cardiovascular exam Rhythm:Regular Rate:Normal     Neuro/Psych  Headaches, Anxiety    GI/Hepatic Neg liver ROS, GERD  Medicated,  Endo/Other  diabetes, Type 1, Insulin DependentHypothyroidism   Renal/GU negative Renal ROS     Musculoskeletal  (+) Arthritis , Osteoarthritis,    Abdominal   Peds  Hematology negative hematology ROS (+)   Anesthesia Other Findings   Reproductive/Obstetrics                             Anesthesia Physical  Anesthesia Plan  ASA: III  Anesthesia Plan: General   Post-op Pain Management:  Regional for Post-op pain   Induction: Intravenous  PONV Risk Score and Plan: 3 and Ondansetron, Dexamethasone, Midazolam and Treatment may vary due to age or medical condition  Airway Management Planned: LMA  Additional Equipment:   Intra-op Plan:   Post-operative Plan: Extubation in OR  Informed Consent: I have reviewed the patients History and Physical, chart, labs and discussed the procedure including the risks, benefits and alternatives for the proposed anesthesia with the patient or authorized representative who has indicated his/her understanding and acceptance.     Dental advisory given  Plan Discussed with: CRNA  Anesthesia Plan Comments:         Anesthesia Quick Evaluation

## 2019-10-24 NOTE — Progress Notes (Signed)
EKG and lab results (blood sugar 278) reviewed by Dr. Sabra Heck.  Will proceed with surgery as scheduled and no new orders received.

## 2019-10-24 NOTE — Op Note (Signed)
Orthopaedic Surgery Operative Note (CSN: [id number])  Debbie Dennis  November 06, 1945 Date of Surgery: 10/24/2019   Diagnoses:  Right trimalleolar ankle fracture dislocation  Procedure: Right trimalleolar ankle ORIF with fixation of posterior lip   Operative Finding Successful completion of the planned procedure.  Patient's bone quality was exceptionally poor with very little structural integrity that I was easily able to advance K wires without using power.  This will complicate her ability to avoid additional fractures but her reduction was reasonable.  She is a highly comminuted fibular fracture with some plastic deformity as well as comminuted medial mal and posterior mal fracture.  I worried about fixation but we put an extra locking screws as well as a syndesmosis screw to obtain bicortical purchase.  Post-operative plan: The patient will be nonweightbearing for 6 weeks transition to a cast after first visit for at least the first 4 weeks after surgery.  The patient will be discharged home.  DVT prophylaxis Aspirin 81 mg twice daily for 6 weeks.   Pain control with PRN pain medication preferring oral medicines.  Follow up plan will be scheduled in approximately 7 days for incision check and XR.  Post-Op Diagnosis: Same Surgeons:Primary: Hiram Gash, MD Assistants:Caroline McBane PA-C Location: [place] OR ROOM 1 Anesthesia: General with regional anesthesia Antibiotics: Ancef 2 g with local vancomycin powder 1 g at the surgical site Tourniquet time:  Total Tourniquet Time Documented: Thigh (Right) - 66 minutes Total: Thigh (Right) - 66 minutes  Estimated Blood Loss: Minimal Complications: None Specimens: None Implants: Implant Name Type Inv. Item Serial No. Manufacturer Lot No. LRB No. Used Action  PLATE 5HOLE LOCKING 624THL - [id number] Plate PLATE 5HOLE LOCKING 624THL  [employer] [id number] Right 1 Implanted  SCREW LOW PROFILE 3.5X14 - [id number] Screw SCREW LOW PROFILE 3.5X14  [employer]  [id number] Right 1 Implanted  SCREW LOCKING 2.7X14MM - [id number] Screw SCREW LOCKING 2.7X14MM  [employer] [id number] Right 2 Implanted  SCREW LOCKING 2.7X16MM - [id number] Screw SCREW LOCKING 2.7X16MM  [employer] [id number] Right 1 Implanted  SCREW LOCKING 3.5X12MM - [id number] Screw SCREW LOCKING 3.5X12MM  [employer] [id number] Right 1 Implanted  SCREW LOCKING 2.7X10 ANKLE - [id number] Screw SCREW LOCKING 2.7X10 ANKLE  [employer] [id number] Right 1 Implanted  SCREW LOCKING 3.5X14MM - [id number] Screw SCREW LOCKING 3.5X14MM  [employer] [id number] Right 2 Implanted  SCREW CORT NL FMS 3.5X45 - [id number] Screw SCREW CORT NL FMS 3.5X45  [employer] [id number] Right 1 Implanted  SCREW LOW PROFILE 3.5X16 - [id number] Screw SCREW LOW PROFILE 3.5X16  [employer] [id number] Right 1 Implanted  SCREW LOW PROFILE CANN 4.0X45 - [id number] Screw SCREW LOW PROFILE CANN 4.0X45  [employer] [id number] Right 2 Implanted  WASHER - [id number] Orthopedic Implant WASHER  [employer] [id number] Right 2 Implanted    Indications for Surgery:   Debbie Dennis is a 74 y.o. female with fall resulting in a ankle fracture dislocation about a week ago.  Her swelling meant that we had to delay surgery in order to safely perform the procedure.  Her CT and x-rays made it seem like she had significant volume reduction poor bone quality we warned her about this.  Benefits and risks of operative and nonoperative management were discussed prior to surgery with patient/guardian(s) and informed consent form was completed.  Specific risks including infection, need for additional surgery, postoperative arthrosis, loss of fixation, periprosthetic fracture, nonunion or malunion.   Procedure:   The patient was identified properly. Informed consent  was obtained and the surgical site was marked. The patient was taken up to suite where general anesthesia was induced.  The patient was positioned supine on a regular bed.  The right ankle was prepped and draped in the  usual sterile fashion.  Timeout was performed before the beginning of the case.  Tourniquet was used for the above duration.  We began with our ORIF of the fibula. A longitudinal approach was made along the lateral border of the fibula centered at the fracture site. We dissected down taking care to avoid the superficial peroneal nerve which crossed proximal to our incision. We encountered the fracture site and noted a atypical fracture pattern with anterior comminution and a large posterior plastically deformed cortical fragment that was essentially separate to the distal fragment. The bone quality was potentially the worst bone quality that I had ever worked with.   The patient's complex fracture pattern meant that we were not able to hold this with a lag screw and the patient instead had a bridge type plate using indirect indirect reduction techniques with clamps to hold a reduction and achieve good length of her fibula.  We try to minimize stripping of the tissues.  At that point we placed 4 distal locking screws as well as multiple locking and nonlocking screws proximally including a Quadra cortical screw due to purchase in the tibia.  Were happy with her final reduction and plate position though slightly anterior.  An oblique incision was made over the anterior aspect of the medial malleolus were able to identify the fracture site itself.  This was essentially a minimally displaced fracture fragment and once we cleared periosteum we are able to hold it with a K wire and clamped.  There was only one screw in place 1 screw is partially threaded on fluoroscopic guidance.  At that point we felt that the posterior malleolus fragment in the setting of his poor bone quality and unstable fracture needed the stabilize.  We decided to percutaneously placed screws would be appropriate.  We used a Valora Corporal to hold the piece in general alignment on fluoroscopic guidance and were able to make a small incision and  dissected down bluntly to the bone to avoid neurovascular compromise just medial to the tibialis anterior.  These fluoroscopic guidance to ensure that we are able to advance a K wire into the fragment measured and placed a 45 mm screw with a washer.  It was medium to poor purchase with the screw as would be expected due to the patient's bone quality.  Final fluoroscopic images demonstrated near anatomic reduction were happy with the overall alignment.  We irrigated the wound copiously before placing local antibiotic as listed above.  Close the incision in a multilayer fashion with absorbable suture.  Sterile dressing was placed.  A well molded well-padded short leg splint was placed.  Patient was awoken taken to PACU in stable condition.  Noemi Chapel, PA-C, present and scrubbed throughout the case, critical for completion in a timely fashion, and for retraction, instrumentation, closure.

## 2019-10-27 ENCOUNTER — Encounter: Payer: Self-pay | Admitting: *Deleted

## 2019-10-27 NOTE — Anesthesia Postprocedure Evaluation (Signed)
Anesthesia Post Note  Patient: Debbie Dennis  Procedure(s) Performed: OPEN REDUCTION INTERNAL FIXATION (ORIF) TRIMALLEOLAR ANKLE FRACTURE (Right Ankle)     Patient location during evaluation: PACU Anesthesia Type: General Level of consciousness: awake and alert Pain management: pain level controlled Vital Signs Assessment: post-procedure vital signs reviewed and stable Respiratory status: spontaneous breathing, nonlabored ventilation and respiratory function stable Cardiovascular status: blood pressure returned to baseline and stable Postop Assessment: no apparent nausea or vomiting Anesthetic complications: no    Last Vitals:  Vitals:   10/24/19 1445 10/24/19 1456  BP: 128/67 130/63  Pulse: 91 96  Resp: (!) 22 18  Temp:  37 C  SpO2: 96% 97%    Last Pain:  Vitals:   10/24/19 1456  TempSrc: Oral  PainSc: 0-No pain                 Audry Pili

## 2019-10-31 DIAGNOSIS — S82851D Displaced trimalleolar fracture of right lower leg, subsequent encounter for closed fracture with routine healing: Secondary | ICD-10-CM | POA: Diagnosis not present

## 2019-11-03 ENCOUNTER — Telehealth: Payer: Self-pay | Admitting: Family Medicine

## 2019-11-03 DIAGNOSIS — M8008XD Age-related osteoporosis with current pathological fracture, vertebra(e), subsequent encounter for fracture with routine healing: Secondary | ICD-10-CM

## 2019-11-03 NOTE — Telephone Encounter (Signed)
Patient's husband to calling to ask did Dr. Steward Ros  At [facility] get in touch with Dr. Rosanna Randy regarding the patient's "soft Bones". Patient has fallen has broken her ankle.  Please advise [phone] Is there a shot for the patient's soft bones? Please advise

## 2019-11-04 NOTE — Telephone Encounter (Signed)
We can refer her to her endocrinologist for osteoporotic fracture treatment.

## 2019-11-04 NOTE — Telephone Encounter (Signed)
Patient's husband is calling again regarding his previous message about his wife and the ortho specialist.  Please advise and call patient asap at [phone]

## 2019-11-04 NOTE — Telephone Encounter (Signed)
Please advise 

## 2019-11-05 NOTE — Telephone Encounter (Signed)
Referral in epic

## 2019-11-15 ENCOUNTER — Other Ambulatory Visit: Payer: Self-pay | Admitting: Family Medicine

## 2019-11-17 ENCOUNTER — Other Ambulatory Visit: Payer: Self-pay | Admitting: Family Medicine

## 2019-11-28 DIAGNOSIS — S82851D Displaced trimalleolar fracture of right lower leg, subsequent encounter for closed fracture with routine healing: Secondary | ICD-10-CM | POA: Diagnosis not present

## 2019-12-02 ENCOUNTER — Telehealth: Payer: Self-pay

## 2019-12-02 NOTE — Telephone Encounter (Signed)
Bone density results have been faxed to Dr. Layne Benton.

## 2019-12-02 NOTE — Telephone Encounter (Signed)
Copied from [place] [id number]. Topic: General - Other >> Dec 02, 2019 12:31 PM Mcneil, Ja-Kwan wrote: Reason for CRM: Pt husband stated he needs pt bone density results to be faxed to Dr. Layne [place] at [phone]

## 2019-12-04 ENCOUNTER — Other Ambulatory Visit: Payer: Self-pay | Admitting: Family Medicine

## 2019-12-04 DIAGNOSIS — R5383 Other fatigue: Secondary | ICD-10-CM | POA: Diagnosis not present

## 2019-12-04 DIAGNOSIS — M810 Age-related osteoporosis without current pathological fracture: Secondary | ICD-10-CM | POA: Diagnosis not present

## 2019-12-04 DIAGNOSIS — E559 Vitamin D deficiency, unspecified: Secondary | ICD-10-CM | POA: Diagnosis not present

## 2019-12-04 NOTE — Telephone Encounter (Signed)
Prescription for LORATADINE 10MG .  Expired on 11/25/2019 Refill requested

## 2019-12-10 ENCOUNTER — Other Ambulatory Visit: Payer: Self-pay | Admitting: Family Medicine

## 2019-12-10 DIAGNOSIS — M5431 Sciatica, right side: Secondary | ICD-10-CM

## 2019-12-10 DIAGNOSIS — M79604 Pain in right leg: Secondary | ICD-10-CM

## 2019-12-10 NOTE — Telephone Encounter (Signed)
Requested Prescriptions  Pending Prescriptions Disp Refills  . gabapentin (NEURONTIN) 100 MG capsule [Pharmacy Med Name: Gabapentin 100 MG Oral Capsule] 180 capsule 1    Sig: TAKE 1 CAPSULE BY MOUTH THREE TIMES DAILY     Neurology: Anticonvulsants - gabapentin Passed - 12/10/2019  5:31 AM      Passed - Valid encounter within last 12 months    Recent Outpatient Visits          7 months ago History of tobacco abuse   [facility] Jerrol Banana., MD   1 year ago Essential hypertension   [facility] Jerrol Banana., MD   1 year ago Type 1 diabetes mellitus with hyperglycemia [facility])   [facility] Jerrol Banana., MD   1 year ago Type 1 diabetes mellitus without complication [facility])   [facility] Jerrol Banana., MD   1 year ago Type 1 diabetes mellitus without complication [facility])   [facility] Jerrol Banana., MD

## 2019-12-12 DIAGNOSIS — N1832 Chronic kidney disease, stage 3b: Secondary | ICD-10-CM | POA: Diagnosis not present

## 2019-12-12 DIAGNOSIS — E1021 Type 1 diabetes mellitus with diabetic nephropathy: Secondary | ICD-10-CM | POA: Diagnosis not present

## 2019-12-12 DIAGNOSIS — M810 Age-related osteoporosis without current pathological fracture: Secondary | ICD-10-CM | POA: Diagnosis not present

## 2019-12-12 DIAGNOSIS — E109 Type 1 diabetes mellitus without complications: Secondary | ICD-10-CM | POA: Diagnosis not present

## 2019-12-16 ENCOUNTER — Other Ambulatory Visit: Payer: Self-pay | Admitting: Family Medicine

## 2019-12-16 DIAGNOSIS — M25571 Pain in right ankle and joints of right foot: Secondary | ICD-10-CM | POA: Diagnosis not present

## 2019-12-16 DIAGNOSIS — R262 Difficulty in walking, not elsewhere classified: Secondary | ICD-10-CM | POA: Diagnosis not present

## 2019-12-16 DIAGNOSIS — M25671 Stiffness of right ankle, not elsewhere classified: Secondary | ICD-10-CM | POA: Diagnosis not present

## 2019-12-16 DIAGNOSIS — M6281 Muscle weakness (generalized): Secondary | ICD-10-CM | POA: Diagnosis not present

## 2019-12-16 NOTE — Telephone Encounter (Signed)
[facility] faxed refill request for the following medications:  losartan (COZAAR) 25 MG tablet - Requesting  90 supply refills    Please advise.  Thanks, [employer]

## 2019-12-16 NOTE — Telephone Encounter (Signed)
Called to schedule patient for office or telephone visit since she hasn't been seen in office since 04/2019. Patient states that she will call back later when she gets back from an appointment.

## 2019-12-18 DIAGNOSIS — M6281 Muscle weakness (generalized): Secondary | ICD-10-CM | POA: Diagnosis not present

## 2019-12-18 DIAGNOSIS — M810 Age-related osteoporosis without current pathological fracture: Secondary | ICD-10-CM | POA: Diagnosis not present

## 2019-12-18 DIAGNOSIS — M25571 Pain in right ankle and joints of right foot: Secondary | ICD-10-CM | POA: Diagnosis not present

## 2019-12-18 DIAGNOSIS — M25671 Stiffness of right ankle, not elsewhere classified: Secondary | ICD-10-CM | POA: Diagnosis not present

## 2019-12-18 DIAGNOSIS — R262 Difficulty in walking, not elsewhere classified: Secondary | ICD-10-CM | POA: Diagnosis not present

## 2019-12-18 MED ORDER — LOSARTAN POTASSIUM 25 MG PO TABS: 25.0000 mg | ORAL_TABLET | Freq: Every day | ORAL | 0 refills | Status: DC

## 2019-12-18 NOTE — Addendum Note (Signed)
Addended by: Julieta Bellini on: 12/18/2019 09:52 AM   Modules accepted: Orders

## 2019-12-18 NOTE — Telephone Encounter (Signed)
Patient scheduled appt for 12/22/2019.

## 2019-12-22 ENCOUNTER — Ambulatory Visit (INDEPENDENT_AMBULATORY_CARE_PROVIDER_SITE_OTHER): Payer: Medicare HMO | Admitting: Family Medicine

## 2019-12-22 ENCOUNTER — Other Ambulatory Visit: Payer: Self-pay

## 2019-12-22 DIAGNOSIS — R262 Difficulty in walking, not elsewhere classified: Secondary | ICD-10-CM | POA: Diagnosis not present

## 2019-12-22 DIAGNOSIS — N1832 Chronic kidney disease, stage 3b: Secondary | ICD-10-CM | POA: Diagnosis not present

## 2019-12-22 DIAGNOSIS — E1022 Type 1 diabetes mellitus with diabetic chronic kidney disease: Secondary | ICD-10-CM

## 2019-12-22 DIAGNOSIS — M8000XD Age-related osteoporosis with current pathological fracture, unspecified site, subsequent encounter for fracture with routine healing: Secondary | ICD-10-CM

## 2019-12-22 DIAGNOSIS — F419 Anxiety disorder, unspecified: Secondary | ICD-10-CM | POA: Diagnosis not present

## 2019-12-22 DIAGNOSIS — I10 Essential (primary) hypertension: Secondary | ICD-10-CM | POA: Diagnosis not present

## 2019-12-22 DIAGNOSIS — E1021 Type 1 diabetes mellitus with diabetic nephropathy: Secondary | ICD-10-CM | POA: Diagnosis not present

## 2019-12-22 DIAGNOSIS — M25671 Stiffness of right ankle, not elsewhere classified: Secondary | ICD-10-CM | POA: Diagnosis not present

## 2019-12-22 DIAGNOSIS — E782 Mixed hyperlipidemia: Secondary | ICD-10-CM

## 2019-12-22 DIAGNOSIS — M6281 Muscle weakness (generalized): Secondary | ICD-10-CM | POA: Diagnosis not present

## 2019-12-22 DIAGNOSIS — M25571 Pain in right ankle and joints of right foot: Secondary | ICD-10-CM | POA: Diagnosis not present

## 2019-12-22 MED ORDER — ROSUVASTATIN CALCIUM 5 MG PO TABS: 5.0000 mg | ORAL_TABLET | Freq: Every day | ORAL | 3 refills | Status: DC

## 2019-12-22 NOTE — Progress Notes (Signed)
Patient: Debbie Dennis Female    DOB: Nov 22, 1945   74 y.o.   MRN: [id number] Visit Date: 12/22/2019  Today's Provider: Wilhemena Durie, MD   Chief Complaint  Patient presents with  . Medication Refill   Subjective:    Virtual Visit via Telephone Note  I connected with Melvie Paglia Neises on 12/22/19 at 11:20 AM EDT by telephone and verified that I am speaking with the correct person using two identifiers.  Location Patient: home Provider: office   I discussed the limitations, risks, security and privacy concerns of performing an evaluation and management service by telephone and the availability of in person appointments. I also discussed with the patient that there may be a patient responsible charge related to this service. The patient expressed understanding and agreed to proceed.   HPI   Patient presents today for medication refill.  This is a telephone call for this patient.  She fell down and suffered a closed trimalleolar fracture of her ankle in January requiring surgery.  She is still going through PT and still wearing a boot.  She is not back to full weightbearing.  She was told she had very brittle bones and is osteoporotic despite being on alendronate.  She saw a doctor [place] in [place] who recommends stopping alendronate and try and Evenity for 1 year followed by going to yearly Prolia shots. She is doing well with her diabetes and followed by Dr. Elisabeth Cara from endocrinology.  In review her last LDL was only 94 but with her diabetes it might be most appropriate to put her on statin.  She is on a ARB.  She has no other complaints.  1. History of tobacco abuse Pt has quit. - CT CHEST LUNG CA SCREEN LOW DOSE W/O CM; Future  2. Essential hypertension Controlled.on Losartan  - CBC with Differential/Platelet - Comprehensive metabolic panel  3. Mixed hyperlipidemia  - Lipid panel - TSH  4. Type 1 diabetes mellitus without complication ([place]) Per Dr  Gabriel Carina  5. ASCVD (arteriosclerotic cardiovascular disease) All risk factors treated. Discussed statin. Allergies  Allergen Reactions  . Codeine Nausea And Vomiting  . Sulfa Antibiotics Rash     Current Outpatient Medications:  .  alendronate (FOSAMAX) 70 MG tablet, Take 1 tablet (70 mg total) by mouth every 7 (seven) days. Take with a full glass of water on an empty stomach., Disp: 4 tablet, Rfl: 12 .  ALPRAZolam (XANAX) 0.5 MG tablet, Take 1 tablet (0.5 mg total) by mouth 2 (two) times daily as needed for anxiety., Disp: 60 tablet, Rfl: 2 .  Cholecalciferol (VITAMIN D) 50 MCG (2000 UT) tablet, Take 2,000 Units by mouth daily., Disp: , Rfl:  .  Continuous Blood Gluc Receiver (FREESTYLE LIBRE 14 DAY READER) DEVI, Use 1 kit every 14 (fourteen) days, Disp: , Rfl:  .  fluticasone (FLONASE) 50 MCG/ACT nasal spray, Place into both nostrils daily. , Disp: , Rfl:  .  gabapentin (NEURONTIN) 100 MG capsule, TAKE 1 CAPSULE BY MOUTH THREE TIMES DAILY, Disp: 270 capsule, Rfl: 1 .  insulin aspart (NOVOLOG) 100 UNIT/ML injection, INJECT UP TO 60 UNITS SUBCUTANEOUSLY DAILY PER  SLIDING  SCALE, Disp: 90 vial, Rfl: 3 .  Insulin Glargine, 2 Unit Dial, (TOUJEO MAX SOLOSTAR) 300 UNIT/ML SOPN, Inject 6-7 Units into the skin at bedtime., Disp: , Rfl:  .  levothyroxine (SYNTHROID) 50 MCG tablet, Take 1 tablet by mouth once daily, Disp: 90 tablet, Rfl: 3 .  loratadine (  CLARITIN) 10 MG tablet, Take 1 tablet by mouth once daily, Disp: 30 tablet, Rfl: 3 .  losartan (COZAAR) 25 MG tablet, Take 1 tablet (25 mg total) by mouth daily., Disp: 30 tablet, Rfl: 0 .  ondansetron (ZOFRAN ODT) 4 MG disintegrating tablet, Take 1 tablet (4 mg total) by mouth every 8 (eight) hours as needed for nausea or vomiting., Disp: 6 tablet, Rfl: 0 .  topiramate (TOPAMAX) 50 MG tablet, Take 3 tablets (150 mg total) by mouth daily. nightly, Disp: 90 tablet, Rfl: 11  Review of Systems  Social History   Tobacco Use  . Smoking status:  Former Smoker    Packs/day: 1.00    Years: 35.00    Pack years: 35.00    Types: Cigarettes    Quit date: 10/02/2012    Years since quitting: 7.2  . Smokeless tobacco: Never Used  Substance Use Topics  . Alcohol use: No      Objective:   There were no vitals taken for this visit. There were no vitals filed for this visit.There is no height or weight on file to calculate BMI.   Physical Exam   No results found for any visits on 12/22/19.     Assessment & Plan     1. Age-related osteoporosis with current pathological fracture with routine healing, subsequent encounter Patient to start Aventi a followed by yearly Prolia  2. Essential hypertension On losartan  3. Type 1 diabetes mellitus with stage 3b chronic kidney disease ([place])   4. Anxiety On as needed Xanax.  5. Mixed hyperlipidemia Start Crestor 5 mg daily recheck 1 to 2 months and get labs  I discussed the assessment and treatment plan with the patient. The patient was provided an opportunity to ask questions and all were answered. The patient agreed with the plan and demonstrated an understanding of the instructions.   The patient was advised to call back or seek an in-person evaluation if the symptoms worsen or if the condition fails to improve as anticipated.  I provided 12 minutes of non-face-to-face time during this encounter.  Trena Platt Cummings,acting as a scribe for Wilhemena Durie, MD.,have documented all relevant documentation on the behalf of Wilhemena Durie, MD,as directed by  Wilhemena Durie, MD while in the presence of Wilhemena Durie, MD.    Wilhemena Durie, MD  [facility]

## 2019-12-25 DIAGNOSIS — R262 Difficulty in walking, not elsewhere classified: Secondary | ICD-10-CM | POA: Diagnosis not present

## 2019-12-25 DIAGNOSIS — M6281 Muscle weakness (generalized): Secondary | ICD-10-CM | POA: Diagnosis not present

## 2019-12-25 DIAGNOSIS — M25671 Stiffness of right ankle, not elsewhere classified: Secondary | ICD-10-CM | POA: Diagnosis not present

## 2019-12-25 DIAGNOSIS — M25571 Pain in right ankle and joints of right foot: Secondary | ICD-10-CM | POA: Diagnosis not present

## 2019-12-30 DIAGNOSIS — R262 Difficulty in walking, not elsewhere classified: Secondary | ICD-10-CM | POA: Diagnosis not present

## 2019-12-30 DIAGNOSIS — M25671 Stiffness of right ankle, not elsewhere classified: Secondary | ICD-10-CM | POA: Diagnosis not present

## 2019-12-30 DIAGNOSIS — M25571 Pain in right ankle and joints of right foot: Secondary | ICD-10-CM | POA: Diagnosis not present

## 2019-12-30 DIAGNOSIS — M6281 Muscle weakness (generalized): Secondary | ICD-10-CM | POA: Diagnosis not present

## 2020-01-01 DIAGNOSIS — R262 Difficulty in walking, not elsewhere classified: Secondary | ICD-10-CM | POA: Diagnosis not present

## 2020-01-01 DIAGNOSIS — M25671 Stiffness of right ankle, not elsewhere classified: Secondary | ICD-10-CM | POA: Diagnosis not present

## 2020-01-01 DIAGNOSIS — M6281 Muscle weakness (generalized): Secondary | ICD-10-CM | POA: Diagnosis not present

## 2020-01-01 DIAGNOSIS — M25571 Pain in right ankle and joints of right foot: Secondary | ICD-10-CM | POA: Diagnosis not present

## 2020-01-05 DIAGNOSIS — R262 Difficulty in walking, not elsewhere classified: Secondary | ICD-10-CM | POA: Diagnosis not present

## 2020-01-05 DIAGNOSIS — M25671 Stiffness of right ankle, not elsewhere classified: Secondary | ICD-10-CM | POA: Diagnosis not present

## 2020-01-05 DIAGNOSIS — M6281 Muscle weakness (generalized): Secondary | ICD-10-CM | POA: Diagnosis not present

## 2020-01-05 DIAGNOSIS — M25571 Pain in right ankle and joints of right foot: Secondary | ICD-10-CM | POA: Diagnosis not present

## 2020-01-08 DIAGNOSIS — M25571 Pain in right ankle and joints of right foot: Secondary | ICD-10-CM | POA: Diagnosis not present

## 2020-01-08 DIAGNOSIS — R262 Difficulty in walking, not elsewhere classified: Secondary | ICD-10-CM | POA: Diagnosis not present

## 2020-01-08 DIAGNOSIS — M25671 Stiffness of right ankle, not elsewhere classified: Secondary | ICD-10-CM | POA: Diagnosis not present

## 2020-01-08 DIAGNOSIS — M6281 Muscle weakness (generalized): Secondary | ICD-10-CM | POA: Diagnosis not present

## 2020-01-09 ENCOUNTER — Other Ambulatory Visit: Payer: Self-pay | Admitting: Family Medicine

## 2020-01-09 NOTE — Telephone Encounter (Signed)
Requested medication (s) are due for refill today: yes  Requested medication (s) are on the active medication list:yes  Last refill: 05/28/19  Future visit scheduled:No  Notes to clinic:  Not delegated    Requested Prescriptions  Pending Prescriptions Disp Refills   ALPRAZolam (XANAX) 0.5 MG tablet [Pharmacy Med Name: ALPRAZolam 0.5 MG Oral Tablet] 60 tablet 0    Sig: TAKE 1 TO 2 TABLETS BY MOUTH ONCE DAILY AS NEEDED      Not Delegated - Psychiatry:  Anxiolytics/Hypnotics Failed - 01/09/2020  6:02 PM      Failed - This refill cannot be delegated      Failed - Urine Drug Screen completed in last 360 days.      Failed - Valid encounter within last 6 months    Recent Outpatient Visits           2 weeks ago Age-related osteoporosis with current pathological fracture with routine healing, subsequent encounter   [facility]   8 months ago History of tobacco abuse   [facility] Jerrol Ban[facility]., MD   1 year ago Essential hypertension   [facility] Jerrol Banana., MD   1 year ago Type 1 diabetes mellitus with hyperglycemia [facility])   [facility] MD   1 year ago Type 1 diabetes mellitus without complication [facility])   [facility] Jerrol Banana., MD

## 2020-01-12 DIAGNOSIS — R262 Difficulty in walking, not elsewhere classified: Secondary | ICD-10-CM | POA: Diagnosis not present

## 2020-01-12 DIAGNOSIS — M25571 Pain in right ankle and joints of right foot: Secondary | ICD-10-CM | POA: Diagnosis not present

## 2020-01-12 DIAGNOSIS — M6281 Muscle weakness (generalized): Secondary | ICD-10-CM | POA: Diagnosis not present

## 2020-01-12 DIAGNOSIS — M25671 Stiffness of right ankle, not elsewhere classified: Secondary | ICD-10-CM | POA: Diagnosis not present

## 2020-01-12 NOTE — Telephone Encounter (Signed)
Please advise? LOV 12/22/19.

## 2020-01-15 DIAGNOSIS — M25671 Stiffness of right ankle, not elsewhere classified: Secondary | ICD-10-CM | POA: Diagnosis not present

## 2020-01-15 DIAGNOSIS — M6281 Muscle weakness (generalized): Secondary | ICD-10-CM | POA: Diagnosis not present

## 2020-01-15 DIAGNOSIS — R262 Difficulty in walking, not elsewhere classified: Secondary | ICD-10-CM | POA: Diagnosis not present

## 2020-01-15 DIAGNOSIS — M25571 Pain in right ankle and joints of right foot: Secondary | ICD-10-CM | POA: Diagnosis not present

## 2020-01-19 ENCOUNTER — Other Ambulatory Visit: Payer: Self-pay | Admitting: Family Medicine

## 2020-01-19 DIAGNOSIS — M25571 Pain in right ankle and joints of right foot: Secondary | ICD-10-CM | POA: Diagnosis not present

## 2020-01-19 DIAGNOSIS — M6281 Muscle weakness (generalized): Secondary | ICD-10-CM | POA: Diagnosis not present

## 2020-01-19 DIAGNOSIS — R262 Difficulty in walking, not elsewhere classified: Secondary | ICD-10-CM | POA: Diagnosis not present

## 2020-01-19 DIAGNOSIS — M25671 Stiffness of right ankle, not elsewhere classified: Secondary | ICD-10-CM | POA: Diagnosis not present

## 2020-01-22 DIAGNOSIS — R262 Difficulty in walking, not elsewhere classified: Secondary | ICD-10-CM | POA: Diagnosis not present

## 2020-01-22 DIAGNOSIS — M25671 Stiffness of right ankle, not elsewhere classified: Secondary | ICD-10-CM | POA: Diagnosis not present

## 2020-01-22 DIAGNOSIS — M25571 Pain in right ankle and joints of right foot: Secondary | ICD-10-CM | POA: Diagnosis not present

## 2020-01-22 DIAGNOSIS — M6281 Muscle weakness (generalized): Secondary | ICD-10-CM | POA: Diagnosis not present

## 2020-01-26 DIAGNOSIS — M25571 Pain in right ankle and joints of right foot: Secondary | ICD-10-CM | POA: Diagnosis not present

## 2020-01-26 DIAGNOSIS — M6281 Muscle weakness (generalized): Secondary | ICD-10-CM | POA: Diagnosis not present

## 2020-01-26 DIAGNOSIS — R262 Difficulty in walking, not elsewhere classified: Secondary | ICD-10-CM | POA: Diagnosis not present

## 2020-01-26 DIAGNOSIS — M25671 Stiffness of right ankle, not elsewhere classified: Secondary | ICD-10-CM | POA: Diagnosis not present

## 2020-01-29 DIAGNOSIS — R262 Difficulty in walking, not elsewhere classified: Secondary | ICD-10-CM | POA: Diagnosis not present

## 2020-01-29 DIAGNOSIS — M25671 Stiffness of right ankle, not elsewhere classified: Secondary | ICD-10-CM | POA: Diagnosis not present

## 2020-01-29 DIAGNOSIS — M25571 Pain in right ankle and joints of right foot: Secondary | ICD-10-CM | POA: Diagnosis not present

## 2020-01-29 DIAGNOSIS — M6281 Muscle weakness (generalized): Secondary | ICD-10-CM | POA: Diagnosis not present

## 2020-02-03 DIAGNOSIS — M25571 Pain in right ankle and joints of right foot: Secondary | ICD-10-CM | POA: Diagnosis not present

## 2020-02-05 ENCOUNTER — Telehealth: Payer: Self-pay

## 2020-02-05 NOTE — Telephone Encounter (Signed)
Please advise 

## 2020-02-05 NOTE — Telephone Encounter (Signed)
Copied from [place] [id number]. Topic: General - Other >> Feb 05, 2020  3:50 PM Mcneil, Ja-Kwan wrote: Reason for CRM: Pt spouse Sonia Side stated pt is completely out of insulin aspart (NOVOLOG) 100 UNIT/ML injection and her doctor at  [facility] that she was assigned to is out of the office. Sonia Side requests that Dr. Rosanna Randy prescribe a refill for a few days until pt refill request is approved. Sonia Side would like Rx sent to [place], [place] if approved. Sonia Side requests call back

## 2020-02-06 MED ORDER — INSULIN ASPART 100 UNIT/ML ~~LOC~~ SOLN: SUBCUTANEOUS | 3 refills | Status: AC

## 2020-02-06 NOTE — Telephone Encounter (Signed)
Refill has been sent to [facility] on [address]. KW

## 2020-02-06 NOTE — Telephone Encounter (Signed)
Ok to rf for 1 month.

## 2020-02-06 NOTE — Addendum Note (Signed)
Addended by: Minette Headland on: 02/06/2020 09:40 AM   Modules accepted: Orders

## 2020-02-24 ENCOUNTER — Other Ambulatory Visit (HOSPITAL_COMMUNITY): Payer: Self-pay | Admitting: *Deleted

## 2020-02-25 ENCOUNTER — Other Ambulatory Visit: Payer: Self-pay

## 2020-02-25 ENCOUNTER — Ambulatory Visit (HOSPITAL_COMMUNITY)
Admission: RE | Admit: 2020-02-25 | Discharge: 2020-02-25 | Disposition: A | Discharge status: Home / Self Care | Payer: Medicare HMO | Source: Ambulatory Visit | Attending: Sports Medicine | Admitting: Sports Medicine

## 2020-02-25 DIAGNOSIS — M810 Age-related osteoporosis without current pathological fracture: Secondary | ICD-10-CM | POA: Insufficient documentation

## 2020-02-25 MED ORDER — ROMOSOZUMAB-AQQG 105 MG/1.17ML ~~LOC~~ SOSY
210.0000 mg | PREFILLED_SYRINGE | SUBCUTANEOUS | Status: DC
  Administered 2020-02-25: 210 mg via SUBCUTANEOUS
  Filled 2020-02-25: qty 2.4

## 2020-02-25 NOTE — Discharge Instructions (Signed)
Romosozumab injection °What is this medicine? °ROMOSOZUMAB (roe moe SOZ ue mab) increases bone formation. It is used to treat osteoporosis in women. °This medicine may be used for other purposes; ask your health care provider or pharmacist if you have questions. °COMMON BRAND NAME(S): EVENITY °What should I tell my health care provider before I take this medicine? °They need to know if you have any of these conditions: °· dental disease or wear dentures °· heart disease °· history of stroke °· kidney disease °· low levels of calcium in the blood °· an unusual or allergic reaction to romosozumab, other medicines, foods, dyes or preservatives °· pregnant or trying to get pregnant °· breast-feeding °How should I use this medicine? °This medicine is for injection under the skin. It is given by a healthcare professional in a hospital or clinic setting. °Talk to your pediatrician about the use of this medicine in children. Special care may be needed. °Overdosage: If you think you have taken too much of this medicine contact a poison control center or emergency room at once. °NOTE: This medicine is only for you. Do not share this medicine with others. °What if I miss a dose? °Keep appointments for follow-up doses. It is important not to miss your dose. Call your doctor or healthcare professional if you are unable to keep an appointment. °What may interact with this medicine? °Interactions are not expected. °This list may not describe all possible interactions. Give your health care provider a list of all the medicines, herbs, non-prescription drugs, or dietary supplements you use. Also tell them if you smoke, drink alcohol, or use illegal drugs. Some items may interact with your medicine. °What should I watch for while using this medicine? °Your condition will be monitored carefully while you are receiving this medicine. °You may need blood work done while you are taking this medicine. °Some people who take this medicine  have severe bone, joint, or muscle pain. This medicine may also increase your risk for jaw problems or a broken thigh bone. Tell your healthcare professional right away if you have severe pain in your jaw, bones, joints, or muscles. Tell your healthcare professional if you have any pain that does not go away or that gets worse. °You should make sure you get enough calcium and vitamin D while you are taking this medicine. Discuss the foods you eat and the vitamins you take with your healthcare professional. °Tell your dentist and dental surgeon that you are taking this medicine. You should not have major dental surgery while on this medicine. See your dentist to have a dental exam and fix any dental problems before starting this medicine. Take good care of your teeth while on this medicine. Make sure you see your dentist for regular follow-up appointments. °What side effects may I notice from receiving this medicine? °Side effects that you should report to your doctor or health care professional as soon as possible: °· allergic reactions like skin rash, itching or hives, swelling of the face, lips, or tongue °· bone pain °· chest pain or chest tightness °· jaw pain, especially after dental work °· signs and symptoms of a stroke like changes in vision; confusion; trouble speaking or understanding; severe headaches; sudden numbness or weakness of the face, arm or leg; trouble walking; dizziness; loss of balance or coordination °· signs and symptoms of low calcium like fast heartbeat; muscle cramps; muscle pain; pain, tingling, numbness in the hands or feet; seizures °Side effects that usually do not require   medical attention (report these to your doctor or health care professional if they continue or are bothersome): °· headache °· joint pain °· pain, redness, or irritation at site where injected °· pain, tingling, numbness in the hands or feet °· swelling of ankles, feet, hands °· trouble sleeping °This list may not  describe all possible side effects. Call your doctor for medical advice about side effects. You may report side effects to FDA at 1-800-FDA-1088. °Where should I keep my medicine? °This medicine is given in a hospital or clinic and will not be stored at home. °NOTE: This sheet is a summary. It may not cover all possible information. If you have questions about this medicine, talk to your doctor, pharmacist, or health care provider. °© 2020 [employer] (2018-01-10 01:15:32) ° °

## 2020-03-19 ENCOUNTER — Other Ambulatory Visit: Payer: Self-pay | Admitting: Family Medicine

## 2020-03-19 MED ORDER — LORATADINE 10 MG PO TABS: 10.0000 mg | ORAL_TABLET | Freq: Every day | ORAL | 0 refills | Status: DC

## 2020-03-19 NOTE — Telephone Encounter (Signed)
Requested Prescriptions  Pending Prescriptions Disp Refills  . loratadine (CLARITIN) 10 MG tablet 90 tablet 0    Sig: Take 1 tablet (10 mg total) by mouth daily.     Ear, Nose, and Throat:  Antihistamines Passed - 03/19/2020 12:09 PM      Passed - Valid encounter within last 12 months    Recent Outpatient Visits          2 months ago Age-related osteoporosis with current pathological fracture with routine healing, subsequent encounter   [facility] Jerrol Ban[facility]., MD   11 months ago History of tobacco abuse   [facility] Jerrol Ban[facility]., MD   1 year ago Essential hypertension   [facility] Jerrol Ban[facility]., MD   1 year ago Type 1 diabetes mellitus with hyperglycemia [facility])   [facility] Jerrol Ban[facility]., MD   1 year ago Type 1 diabetes mellitus without complication [facility])   [facility] Jerrol Banana., MD      Future Appointments            In 1 month Jerrol Banana., MD [facility]

## 2020-03-19 NOTE — Telephone Encounter (Signed)
Medication Refill - Medication: loratadine (CLARITIN) 10 MG tablet    Has the patient contacted their pharmacy? Yes.   (Agent: If no, request that the patient contact the pharmacy for the refill.) (Agent: If yes, when and what did the pharmacy advise?)  Preferred Pharmacy (with phone number or street name):  [facility] [address] [place] [address]  Phone: [phone] Fax: [phone]     Agent: Please be advised that RX refills may take up to 3 business days. We ask that you follow-up with your pharmacy.

## 2020-03-24 ENCOUNTER — Other Ambulatory Visit: Payer: Self-pay

## 2020-03-24 ENCOUNTER — Ambulatory Visit (HOSPITAL_COMMUNITY)
Admission: RE | Admit: 2020-03-24 | Discharge: 2020-03-24 | Disposition: A | Discharge status: Home / Self Care | Payer: Medicare HMO | Source: Ambulatory Visit | Attending: Sports Medicine | Admitting: Sports Medicine

## 2020-03-24 DIAGNOSIS — M810 Age-related osteoporosis without current pathological fracture: Secondary | ICD-10-CM | POA: Insufficient documentation

## 2020-03-24 MED ORDER — ROMOSOZUMAB-AQQG 105 MG/1.17ML ~~LOC~~ SOSY
210.0000 mg | PREFILLED_SYRINGE | SUBCUTANEOUS | Status: DC
  Administered 2020-03-24: 210 mg via SUBCUTANEOUS
  Filled 2020-03-24: qty 2.4

## 2020-04-17 ENCOUNTER — Other Ambulatory Visit: Payer: Self-pay | Admitting: Family Medicine

## 2020-04-17 NOTE — Telephone Encounter (Signed)
Refilled with enough med to last until office visit Requested Prescriptions  Pending Prescriptions Disp Refills  . losartan (COZAAR) 25 MG tablet [Pharmacy Med Name: Losartan Potassium 25 MG Oral Tablet] 10 tablet 0    Sig: TAKE 1 TABLET BY MOUTH ONCE DAILY NEEDS  OFFICE  VISIT  FOR  MORE  REFILLS     Cardiovascular:  Angiotensin Receptor Blockers Failed - 04/17/2020 12:07 PM      Failed - Cr in normal range and within 180 days    Creatinine, Ser  Date Value Ref Range Status  10/24/2019 1.78 (H) 0.44 - 1.00 mg/dL Final         Failed - Valid encounter within last 6 months    Recent Outpatient Visits          3 months ago Age-related osteoporosis with current pathological fracture with routine healing, subsequent encounter   [facility] Jerrol [facility] MD   12 months ago History of tobacco abuse   [facility]., MD   1 year ago Essential hypertension   [facility] Jerrol Ban[facility]., MD   1 year ago Type 1 diabetes mellitus with hyperglycemia [facility])   [facility] Jerrol [facility]., MD   1 year ago Type 1 diabetes mellitus without complication [facility])   [facility] Jerrol Banana., MD      Future Appointments            In 1 week Jerrol Banana., MD [facility]           Passed - K in normal range and within 180 days    Potassium  Date Value Ref Range Status  10/24/2019 3.5 3.5 - 5.1 mmol/L Final         Passed - Patient is not pregnant      Passed - Last BP in normal range    BP Readings from Last 1 Encounters:  03/24/20 126/71

## 2020-04-21 ENCOUNTER — Other Ambulatory Visit: Payer: Self-pay

## 2020-04-21 ENCOUNTER — Ambulatory Visit (HOSPITAL_COMMUNITY)
Admission: RE | Admit: 2020-04-21 | Discharge: 2020-04-21 | Disposition: A | Discharge status: Home / Self Care | Payer: Medicare HMO | Source: Ambulatory Visit | Attending: Sports Medicine | Admitting: Sports Medicine

## 2020-04-21 DIAGNOSIS — M810 Age-related osteoporosis without current pathological fracture: Secondary | ICD-10-CM | POA: Insufficient documentation

## 2020-04-21 MED ORDER — ROMOSOZUMAB-AQQG 105 MG/1.17ML ~~LOC~~ SOSY
210.0000 mg | PREFILLED_SYRINGE | SUBCUTANEOUS | Status: DC
  Administered 2020-04-21: 210 mg via SUBCUTANEOUS
  Filled 2020-04-21: qty 2.4

## 2020-04-23 NOTE — Progress Notes (Signed)
Annual Wellness Visit    I,[name],acting as a scribe for Wilhemena Durie, MD.,have documented all relevant documentation on the behalf of Wilhemena Durie, MD,as directed by  Wilhemena Durie, MD while in the presence of Wilhemena Durie, MD.   Patient: Debbie Dennis, Female    DOB: 04-Mar-1946, 74 y.o.   MRN: [id number] Visit Date: 04/26/2020  Today's Provider: Wilhemena Durie, MD   Chief Complaint  Patient presents with  . Medicare Wellness   Subjective    Debbie Dennis is a 74 y.o. female who presents today for her Annual physical exam. She reports consuming a general diet. The patient does not participate in regular exercise at present. She generally feels fairly well. She reports sleeping fairly well. She does not have additional problems to discuss today.   HPI       Medications: Outpatient Medications Prior to Visit  Medication Sig  . ALPRAZolam (XANAX) 0.5 MG tablet TAKE 1 TO 2 TABLETS BY MOUTH ONCE DAILY AS NEEDED  . aspirin EC 81 MG tablet Take 81 mg by mouth daily. Swallow whole.  Marland Kitchen CALCIUM PO Take by mouth.  . Cholecalciferol (VITAMIN D) 50 MCG (2000 UT) tablet Take 4,000 Units by mouth.   . fluticasone (FLONASE) 50 MCG/ACT nasal spray Place into both nostrils daily.   Marland Kitchen gabapentin (NEURONTIN) 100 MG capsule TAKE 1 CAPSULE BY MOUTH THREE TIMES DAILY  . insulin aspart (NOVOLOG) 100 UNIT/ML injection INJECT UP TO 60 UNITS SUBCUTANEOUSLY DAILY PER  SLIDING  SCALE  . Insulin Glargine, 2 Unit Dial, (TOUJEO MAX SOLOSTAR) 300 UNIT/ML SOPN Inject 6-7 Units into the skin at bedtime.  Marland Kitchen levothyroxine (SYNTHROID) 50 MCG tablet Take 1 tablet by mouth once daily  . loratadine (CLARITIN) 10 MG tablet Take 1 tablet (10 mg total) by mouth daily.  Marland Kitchen losartan (COZAAR) 25 MG tablet TAKE 1 TABLET BY MOUTH ONCE DAILY NEEDS  OFFICE  VISIT  FOR  MORE  REFILLS  . rosuvastatin (CRESTOR) 5 MG tablet Take 1 tablet (5 mg total) by mouth daily.  Marland Kitchen topiramate (TOPAMAX) 50 MG  tablet Take 3 tablets (150 mg total) by mouth daily. nightly  . alendronate (FOSAMAX) 70 MG tablet Take 1 tablet (70 mg total) by mouth every 7 (seven) days. Take with a full glass of water on an empty stomach. (Patient not taking: Reported on 04/26/2020)  . Continuous Blood Gluc Receiver (FREESTYLE LIBRE 14 DAY READER) DEVI Use 1 kit every 14 (fourteen) days (Patient not taking: Reported on 04/26/2020)  . ondansetron (ZOFRAN ODT) 4 MG disintegrating tablet Take 1 tablet (4 mg total) by mouth every 8 (eight) hours as needed for nausea or vomiting. (Patient not taking: Reported on 12/22/2019)   No facility-administered medications prior to visit.    Allergies  Allergen Reactions  . Codeine Nausea And Vomiting  . Sulfa Antibiotics Rash    Patient Care Team: Jerrol Banana., MD as PCP - General (Family Medicine) Leandrew Koyanagi, MD as Referring Physician (Ophthalmology) Gabriel Carina Betsey Holiday, MD as Physician Assistant (Endocrinology)  Review of Systems  Constitutional: Negative.   HENT: Negative.   Eyes: Negative.   Respiratory: Negative.   Cardiovascular: Negative.   Gastrointestinal: Negative.   Endocrine: Negative.        Could not work with the continuous blood glucose monitor.  Still checking sugar 10-12 times a day and followed by endocrinology.  Genitourinary: Positive for frequency.  Musculoskeletal: Positive for arthralgias.  Allergic/Immunologic: Negative.  Neurological: Negative.   Hematological: Negative.   Psychiatric/Behavioral: Negative.        Objective    Vitals: BP 116/69 (BP Location: Right Arm, Patient Position: Sitting, Cuff Size: Normal)   Pulse 69   Temp (!) 97.5 F (36.4 C) (Other (Comment))   Resp 16   Ht 5' 1"  (1.549 m)   Wt 126 lb (57.2 kg)   SpO2 98%   BMI 23.81 kg/m  BP Readings from Last 3 Encounters:  04/26/20 116/69  04/21/20 (!) 110/55  03/24/20 126/71   Wt Readings from Last 3 Encounters:  04/26/20 126 lb (57.2 kg)  10/24/19 128  lb 1.4 oz (58.1 kg)  10/19/19 128 lb (58.1 kg)      Physical Exam  General Appearance:    Well developed, well nourished female. Alert, cooperative, in no acute distress, appears stated age   Head:    Normocephalic, without obvious abnormality, atraumatic  Eyes:    PERRL, conjunctiva/corneas clear, EOM's intact, fundi    benign, both eyes  Ears:    Normal TM's and external ear canals, both ears  Nose:   Nares normal, septum midline, mucosa normal, no drainage    or sinus tenderness  Throat:   Lips, mucosa, and tongue normal; teeth and gums normal  Neck:   Supple, symmetrical, trachea midline, no adenopathy;    thyroid:  no enlargement/tenderness/nodules; no carotid   bruit or JVD  Back:     Symmetric, no curvature, ROM normal, no CVA tenderness  Lungs:     Clear to auscultation bilaterally, respirations unlabored  Chest Wall:    No tenderness or deformity   Heart:    Normal heart rate. Normal rhythm. No murmurs, rubs, or gallops.   Breast Exam:    normal appearance, no masses or tenderness, deferred  Abdomen:     Soft, non-tender, bowel sounds active all four quadrants,    no masses, no organomegaly  Pelvic:    no adnexal masses or tenderness  Extremities:   All extremities are intact. No cyanosis or edema  Pulses:   2+ and symmetric all extremities  Skin:   Skin color, texture, turgor normal, no rashes or lesions  Lymph nodes:   Cervical, supraclavicular, and axillary nodes normal  Neurologic:   CNII-XII intact, normal strength, sensation and reflexes    throughout     Most recent functional status assessment: In your present state of health, do you have any difficulty performing the following activities: 04/26/2020  Hearing? N  Vision? N  Difficulty concentrating or making decisions? N  Walking or climbing stairs? Y  Dressing or bathing? N  Doing errands, shopping? N  Some recent data might be hidden   Most recent fall risk assessment: Fall Risk  04/26/2020  Falls in  the past year? 1  Number falls in past yr: 1  Injury with Fall? 1  Comment -  Follow up -    Most recent depression screenings: PHQ 2/9 Scores 04/26/2020 04/22/2019  PHQ - 2 Score 3 0  PHQ- 9 Score 10 -   Most recent cognitive screening: 6CIT Screen 04/17/2017  What Year? 0 points  What month? 0 points  What time? 0 points  Count back from 20 0 points  Months in reverse 0 points  Repeat phrase 4 points  Total Score 4   Most recent Audit-C alcohol use screening Alcohol Use Disorder Test (AUDIT) 04/26/2020  1. How often do you have a drink containing alcohol? 0  2. How  many drinks containing alcohol do you have on a typical day when you are drinking? 0  3. How often do you have six or more drinks on one occasion? 0  AUDIT-C Score 0  Alcohol Brief Interventions/Follow-up AUDIT Score <7 follow-up not indicated   A score of 3 or more in women, and 4 or more in men indicates increased risk for alcohol abuse, EXCEPT if all of the points are from question 1   No results found for any visits on 04/26/20.  Assessment & Plan     Annual wellness visit done today including the all of the following: Reviewed patient's Family Medical History Reviewed and updated list of patient's medical providers Assessment of cognitive impairment was done Assessed patient's functional ability Established a written schedule for health screening [place] Completed and Reviewed  Exercise Activities and Dietary recommendations Goals    . DIET - EAT MORE FRUITS AND VEGETABLES     Recommend to eat 2-3 vegetables in daily diet.     Marland Kitchen DIET - INCREASE WATER INTAKE     Recommend increasing water intake to 4 glasses a day.     . Increase water intake     Recommend increasing water intake to 4-6 glasses a day.        Immunization History  Administered Date(s) Administered  . Fluad Quad(high Dose 65+) 07/15/2019  . Influenza, High Dose Seasonal PF 07/22/2015, 07/24/2016,  07/19/2017, 06/12/2018  . Pneumococcal Conjugate-13 08/31/2014  . Pneumococcal Polysaccharide-23 07/11/2007, 01/16/2013  . Tdap 08/25/2008  . Zoster 03/02/2012    Health Maintenance  Topic Date Due  . COVID-19 Vaccine (1) Never done  . OPHTHALMOLOGY EXAM  07/23/2018  . TETANUS/TDAP  08/25/2018  . HEMOGLOBIN A1C  10/17/2019  . FOOT EXAM  04/15/2020  . INFLUENZA VACCINE  05/02/2020  . MAMMOGRAM  10/14/2020  . DEXA SCAN  06/25/2024  . COLONOSCOPY  05/13/2025  . Hepatitis C Screening  Completed  . PNA vac Low Risk Adult  Completed     Discussed health benefits of physical activity, and encouraged her to engage in regular exercise appropriate for her age and condition.    1. Annual physical exam   2. Medicare annual wellness visit, subsequent PHQ-9 is 10.  I think this is baseline for patient.  Repeat PHQ-9 on next visit. - TSH - Lipid panel - CBC w/Diff/Platelet - Comprehensive Metabolic Panel (CMET)  3. Type 1 diabetes mellitus with stage 3b chronic kidney disease ([place]) Followed by endocrinology - TSH - Lipid panel - CBC w/Diff/Platelet - Comprehensive Metabolic Panel (CMET)  4. Essential hypertension Controlled. - TSH - Lipid panel - CBC w/Diff/Platelet - Comprehensive Metabolic Panel (CMET)  5. Mixed hyperlipidemia  - TSH - Lipid panel - CBC w/Diff/Platelet - Comprehensive Metabolic Panel (CMET)  6. Urgency of urination Urine culture. - TSH - Lipid panel - CBC w/Diff/Platelet - Comprehensive Metabolic Panel (CMET) - POCT urinalysis dipstick - CULTURE, URINE COMPREHENSIVE  7. Encounter for screening fecal occult blood testing OC negative - IFOBT POC (occult bld, rslt in office); Future - IFOBT POC (occult bld, rslt in office)    No follow-ups on file.        Lada Fulbright Cranford Mon, MD  [facility] [phone] (phone) [phone] (fax)  [place]

## 2020-04-26 ENCOUNTER — Other Ambulatory Visit: Payer: Self-pay

## 2020-04-26 ENCOUNTER — Encounter: Payer: Self-pay | Admitting: Family Medicine

## 2020-04-26 ENCOUNTER — Ambulatory Visit (INDEPENDENT_AMBULATORY_CARE_PROVIDER_SITE_OTHER): Payer: Medicare HMO | Admitting: Family Medicine

## 2020-04-26 VITALS — BP 116/69 | HR 69 | Temp 97.5°F | Resp 16 | Ht 61.0 in | Wt 126.0 lb

## 2020-04-26 DIAGNOSIS — Z1211 Encounter for screening for malignant neoplasm of colon: Secondary | ICD-10-CM | POA: Diagnosis not present

## 2020-04-26 DIAGNOSIS — N1832 Chronic kidney disease, stage 3b: Secondary | ICD-10-CM

## 2020-04-26 DIAGNOSIS — I10 Essential (primary) hypertension: Secondary | ICD-10-CM

## 2020-04-26 DIAGNOSIS — E782 Mixed hyperlipidemia: Secondary | ICD-10-CM | POA: Diagnosis not present

## 2020-04-26 DIAGNOSIS — E1022 Type 1 diabetes mellitus with diabetic chronic kidney disease: Secondary | ICD-10-CM

## 2020-04-26 DIAGNOSIS — R3915 Urgency of urination: Secondary | ICD-10-CM

## 2020-04-26 DIAGNOSIS — F419 Anxiety disorder, unspecified: Secondary | ICD-10-CM

## 2020-04-26 DIAGNOSIS — Z0000 Encounter for general adult medical examination without abnormal findings: Secondary | ICD-10-CM

## 2020-04-26 DIAGNOSIS — E1021 Type 1 diabetes mellitus with diabetic nephropathy: Secondary | ICD-10-CM

## 2020-04-26 LAB — POCT URINALYSIS DIPSTICK
Appearance: NORMAL
Bilirubin, UA: NEGATIVE
Blood, UA: NEGATIVE
Glucose, UA: NEGATIVE
Nitrite, UA: POSITIVE
Odor: NORMAL
Protein, UA: NEGATIVE
Spec Grav, UA: 1.01 (ref 1.010–1.025)
Urobilinogen, UA: 0.2 E.U./dL
pH, UA: 6.5 (ref 5.0–8.0)

## 2020-04-26 LAB — IFOBT (OCCULT BLOOD): IFOBT: NEGATIVE

## 2020-04-27 DIAGNOSIS — Z0000 Encounter for general adult medical examination without abnormal findings: Secondary | ICD-10-CM | POA: Diagnosis not present

## 2020-04-27 DIAGNOSIS — R3915 Urgency of urination: Secondary | ICD-10-CM | POA: Diagnosis not present

## 2020-04-27 DIAGNOSIS — I10 Essential (primary) hypertension: Secondary | ICD-10-CM | POA: Diagnosis not present

## 2020-04-27 DIAGNOSIS — E782 Mixed hyperlipidemia: Secondary | ICD-10-CM | POA: Diagnosis not present

## 2020-04-27 DIAGNOSIS — E1021 Type 1 diabetes mellitus with diabetic nephropathy: Secondary | ICD-10-CM | POA: Diagnosis not present

## 2020-04-27 DIAGNOSIS — N1832 Chronic kidney disease, stage 3b: Secondary | ICD-10-CM | POA: Diagnosis not present

## 2020-04-28 LAB — CBC WITH DIFFERENTIAL/PLATELET
Basophils Absolute: 0.1 10*3/uL (ref 0.0–0.2)
Basos: 1 %
EOS (ABSOLUTE): 0.2 10*3/uL (ref 0.0–0.4)
Eos: 3 %
Hematocrit: 37.7 % (ref 34.0–46.6)
Hemoglobin: 12.1 g/dL (ref 11.1–15.9)
Immature Grans (Abs): 0 10*3/uL (ref 0.0–0.1)
Immature Granulocytes: 0 %
Lymphocytes Absolute: 2.4 10*3/uL (ref 0.7–3.1)
Lymphs: 41 %
MCH: 33 pg (ref 26.6–33.0)
MCHC: 32.1 g/dL (ref 31.5–35.7)
MCV: 103 fL — ABNORMAL HIGH (ref 79–97)
Monocytes Absolute: 0.6 10*3/uL (ref 0.1–0.9)
Monocytes: 10 %
Neutrophils Absolute: 2.7 10*3/uL (ref 1.4–7.0)
Neutrophils: 45 %
Platelets: 275 10*3/uL (ref 150–450)
RBC: 3.67 x10E6/uL — ABNORMAL LOW (ref 3.77–5.28)
RDW: 12 % (ref 11.7–15.4)
WBC: 5.9 10*3/uL (ref 3.4–10.8)

## 2020-04-28 LAB — COMPREHENSIVE METABOLIC PANEL
ALT: 12 IU/L (ref 0–32)
AST: 17 IU/L (ref 0–40)
Albumin/Globulin Ratio: 2.1 (ref 1.2–2.2)
Albumin: 4 g/dL (ref 3.7–4.7)
Alkaline Phosphatase: 112 IU/L (ref 48–121)
BUN/Creatinine Ratio: 19 (ref 12–28)
BUN: 26 mg/dL (ref 8–27)
Bilirubin Total: 0.2 mg/dL (ref 0.0–1.2)
CO2: 22 mmol/L (ref 20–29)
Calcium: 9.6 mg/dL (ref 8.7–10.3)
Chloride: 103 mmol/L (ref 96–106)
Creatinine, Ser: 1.38 mg/dL — ABNORMAL HIGH (ref 0.57–1.00)
GFR calc Af Amer: 44 mL/min/{1.73_m2} — ABNORMAL LOW (ref >59)
GFR calc non Af Amer: 38 mL/min/{1.73_m2} — ABNORMAL LOW (ref >59)
Globulin, Total: 1.9 g/dL (ref 1.5–4.5)
Glucose: 151 mg/dL — ABNORMAL HIGH (ref 65–99)
Potassium: 4.7 mmol/L (ref 3.5–5.2)
Sodium: 139 mmol/L (ref 134–144)
Total Protein: 5.9 g/dL — ABNORMAL LOW (ref 6.0–8.5)

## 2020-04-28 LAB — LIPID PANEL
Chol/HDL Ratio: 2.3 ratio (ref 0.0–4.4)
Cholesterol, Total: 142 mg/dL (ref 100–199)
HDL: 62 mg/dL (ref >39)
LDL Chol Calc (NIH): 67 mg/dL (ref 0–99)
Triglycerides: 64 mg/dL (ref 0–149)
VLDL Cholesterol Cal: 13 mg/dL (ref 5–40)

## 2020-04-28 LAB — TSH: TSH: 2.24 u[IU]/mL (ref 0.450–4.500)

## 2020-04-29 NOTE — Progress Notes (Signed)
Subjective:   Debbie Dennis is a 74 y.o. female who presents for Medicare Annual (Subsequent) preventive examination.  I connected with Montrice Montuori today by telephone and verified that I am speaking with the correct person using two identifiers. Location patient: home Location provider: work Persons participating in the virtual visit: patient, provider.   I discussed the limitations, risks, security and privacy concerns of performing an evaluation and management service by telephone and the availability of in person appointments. I also discussed with the patient that there may be a patient responsible charge related to this service. The patient expressed understanding and verbally consented to this telephonic visit.    Interactive audio and video telecommunications were attempted between this provider and patient, however failed, due to patient having technical difficulties OR patient did not have access to video capability.  We continued and completed visit with audio only.   Review of Systems    N/A  Cardiac Risk Factors include: advanced age (>41mn, >>70women);diabetes mellitus;dyslipidemia;hypertension     Objective:    Today's Vitals   05/03/20 1523  PainSc: 0-No pain   There is no height or weight on file to calculate BMI.  Advanced Directives 05/03/2020 05/01/2020 10/24/2019 10/21/2019 10/19/2019 04/22/2019 04/20/2018  Does Patient Have a Medical Advance Directive? No No No No No No No  Would patient like information on creating a medical advance directive? No - Patient declined - No - Patient declined No - Patient declined - No - Patient declined -    Current Medications (verified) Outpatient Encounter Medications as of 05/03/2020  Medication Sig  . ALPRAZolam (XANAX) 0.5 MG tablet TAKE 1 TO 2 TABLETS BY MOUTH ONCE DAILY AS NEEDED (Patient taking differently: Take 1 mg by mouth at bedtime. )  . aspirin EC 81 MG tablet 81 mg at bedtime. Swallow whole.   .Marland KitchenCALCIUM PO Take 600 mg  by mouth at bedtime.   . Cholecalciferol (VITAMIN D) 50 MCG (2000 UT) tablet Take 4,000 Units by mouth at bedtime.   . Continuous Blood Gluc Receiver (FREESTYLE LIBRE 14 DAY READER) DEVI Use 1 kit every 14 (fourteen) days  . fluticasone (FLONASE) 50 MCG/ACT nasal spray Place 2 sprays into both nostrils daily as needed for allergies.   .Marland Kitchengabapentin (NEURONTIN) 100 MG capsule TAKE 1 CAPSULE BY MOUTH THREE TIMES DAILY (Patient taking differently: Take 300 mg by mouth at bedtime. )  . HYDROcodone-acetaminophen (NORCO) 5-325 MG tablet Take 1 tablet by mouth every 4 (four) hours as needed for severe pain.  .Marland Kitcheninsulin aspart (NOVOLOG) 100 UNIT/ML injection INJECT UP TO 60 UNITS SUBCUTANEOUSLY DAILY PER  SLIDING  SCALE (Patient taking differently: Inject 1-60 Units into the skin See admin instructions. INJECT UP TO 60 UNITS SUBCUTANEOUSLY DAILY PER  SLIDING  SCALE)  . Insulin Glargine, 2 Unit Dial, (TOUJEO MAX SOLOSTAR) 300 UNIT/ML SOPN Inject 6 Units into the skin at bedtime.   .Marland Kitchenlevothyroxine (SYNTHROID) 50 MCG tablet Take 1 tablet by mouth once daily (Patient taking differently: Take 50 mcg by mouth daily before breakfast. )  . loratadine (CLARITIN) 10 MG tablet Take 1 tablet (10 mg total) by mouth daily. (Patient taking differently: Take 10 mg by mouth at bedtime. )  . losartan (COZAAR) 25 MG tablet TAKE 1 TABLET BY MOUTH ONCE DAILY NEEDS  OFFICE  VISIT  FOR  MORE  REFILLS (Patient taking differently: Take 25 mg by mouth at bedtime. )  . nitrofurantoin, macrocrystal-monohydrate, (MACROBID) 100 MG capsule Take 1 capsule (  100 mg total) by mouth 2 (two) times daily.  . Romosozumab-aqqg (EVENITY) 105 MG/1.17ML SOSY injection Inject 210 mg into the skin every 30 (thirty) days.  . rosuvastatin (CRESTOR) 5 MG tablet Take 1 tablet (5 mg total) by mouth daily.  Marland Kitchen topiramate (TOPAMAX) 50 MG tablet Take 3 tablets (150 mg total) by mouth daily. nightly (Patient taking differently: Take 150 mg by mouth at bedtime. )  .  [DISCONTINUED] alendronate (FOSAMAX) 70 MG tablet Take 1 tablet (70 mg total) by mouth every 7 (seven) days. Take with a full glass of water on an empty stomach. (Patient not taking: Reported on 04/26/2020)  . [DISCONTINUED] ondansetron (ZOFRAN ODT) 4 MG disintegrating tablet Take 1 tablet (4 mg total) by mouth every 8 (eight) hours as needed for nausea or vomiting. (Patient not taking: Reported on 12/22/2019)   No facility-administered encounter medications on file as of 05/03/2020.    Allergies (verified) Codeine and Sulfa antibiotics   History: Past Medical History:  Diagnosis Date  . Adaptive colitis   . Anxiety   . Arthritis   . Bronchitis    CHRONIC  . Buedinger-Ludloff-Laewen disease   . Complication of anesthesia    hard to put to sleep-needs more meds  . Diabetes mellitus without complication (HCC)    IDDM  . GERD (gastroesophageal reflux disease)   . Headache    MIGRAINES  . Hypertension   . Hypothyroidism   . Restless leg syndrome    Past Surgical History:  Procedure Laterality Date  . ABDOMINAL HYSTERECTOMY     has ovaries-due to abnormal pap smears  . BREAST EXCISIONAL BIOPSY Left 1988  . BREAST SURGERY     lumpectomy  . CATARACT EXTRACTION W/PHACO Left 01/20/2016   Procedure: CATARACT EXTRACTION PHACO AND INTRAOCULAR LENS PLACEMENT (IOC);  Surgeon: Leandrew Koyanagi, MD;  Location: [facility] ORS;  Service: Ophthalmology;  Laterality: Left;  Lot # [id number] H US:02:01:5 AP:20.2% CDE: 24.51  . CATARACT EXTRACTION W/PHACO Right 11/28/2016   Procedure: CATARACT EXTRACTION PHACO AND INTRAOCULAR LENS PLACEMENT (IOC);  Surgeon: Leandrew Koyanagi, MD;  Location: [facility] ORS;  Service: Ophthalmology;  Laterality: Right;  [place] 01:01 AP% 18.3 CDE 11.26 Fluid pack lot # [id number]  . COLONOSCOPY WITH PROPOFOL N/A 05/14/2015   Procedure: COLONOSCOPY WITH PROPOFOL;  Surgeon: Josefine Class, MD;  Location: [facility] ENDOSCOPY;  Service: Endoscopy;  Laterality: N/A;  . EYE SURGERY      cataract left  . ORIF ANKLE FRACTURE Right 10/24/2019   Procedure: OPEN REDUCTION INTERNAL FIXATION (ORIF) TRIMALLEOLAR ANKLE FRACTURE;  Surgeon: Hiram Gash, MD;  Location: [place];  Service: Orthopedics;  Laterality: Right;   Family History  Problem Relation Age of Onset  . Cancer Mother        spread into her lungs-died from heart issues   . Heart attack Mother   . COPD Father   . Throat cancer Father   . Heart disease Father   . Arthritis Sister   . Breast cancer Sister 18  . Lung cancer Brother   . Asthma Maternal Grandfather    Social History   Socioeconomic History  . Marital status: Married    Spouse name: Sonia Side  . Number of children: 1  . Years of education: 12  . Highest education level: 12th grade  Occupational History  . Occupation: retired  Tobacco Use  . Smoking status: Former Smoker    Packs/day: 1.00    Years: 35.00    Pack years: 35.00  Types: Cigarettes    Quit date: 10/02/2012    Years since quitting: 7.5  . Smokeless tobacco: Never Used  Vaping Use  . Vaping Use: Never used  Substance and Sexual Activity  . Alcohol use: No  . Drug use: No  . Sexual activity: Not Currently    Birth control/protection: None, Surgical  Other Topics Concern  . Not on file  Social History Narrative  . Not on file   Social Determinants of Health   Financial Resource Strain: Low Risk   . Difficulty of Paying Living Expenses: Not hard at all  Food Insecurity: No Food Insecurity  . Worried About Charity fundraiser in the Last Year: Never true  . Ran Out of Food in the Last Year: Never true  Transportation Needs: No Transportation Needs  . Lack of Transportation (Medical): No  . Lack of Transportation (Non-Medical): No  Physical Activity: Inactive  . Days of Exercise per Week: 0 days  . Minutes of Exercise per Session: 0 min  Stress: No Stress Concern Present  . Feeling of Stress : Only a little  Social Connections: Moderately Isolated  .  Frequency of Communication with Friends and Family: More than three times a week  . Frequency of Social Gatherings with Friends and Family: Twice a week  . Attends Religious Services: Never  . Active Member of Clubs or Organizations: No  . Attends Archivist Meetings: Never  . Marital Status: Married    Tobacco Counseling Counseling given: Not Answered   Clinical Intake:  Pre-visit preparation completed: Yes  Pain : No/denies pain Pain Score: 0-No pain     Nutritional Risks: None Diabetes: Yes  How often do you need to have someone help you when you read instructions, pamphlets, or other written materials from your doctor or pharmacy?: 1 - Never  Diabetic? Yes, type 1  Nutrition Risk Assessment:  Has the patient had any N/V/D within the last 2 months?  No  Does the patient have any non-healing wounds?  No  Has the patient had any unintentional weight loss or weight gain?  No   Diabetes:  Is the patient diabetic?  Yes  If diabetic, was a CBG obtained today?  No  Did the patient bring in their glucometer from home?  No  How often do you monitor your CBG's? 6-10 times daily.   Financial Strains and Diabetes Management:  Are you having any financial strains with the device, your supplies or your medication? No .  Does the patient want to be seen by Chronic Care Management for management of their diabetes?  No  Would the patient like to be referred to a Nutritionist or for Diabetic Management?  No   Diabetic Exams:  Diabetic Eye Exam: Completed 12/12/19. Repeat yearly. Diabetic Foot Exam: Completed 12/12/19. Repeat yearly.    Interpreter Needed?: No  Information entered by :: [facility], LPN   Activities of Daily Living In your present state of health, do you have any difficulty performing the following activities: 05/03/2020 04/26/2020  Hearing? Y N  Comment Has hearing loss in the left ear. -  Vision? N N  Difficulty concentrating or making decisions? N  N  Walking or climbing stairs? Y Y  Comment Due to recent foot injury. -  Dressing or bathing? N N  Doing errands, shopping? N N  Preparing Food and eating ? N -  Using the Toilet? N -  In the past six months, have you accidently leaked urine?  N -  Do you have problems with loss of bowel control? N -  Managing your Medications? N -  Managing your Finances? N -  Housekeeping or managing your Housekeeping? N -  Some recent data might be hidden    Patient Care Team: Jerrol Banana., MD as PCP - General (Family Medicine) Leandrew Koyanagi, MD as Referring Physician (Ophthalmology) Gabriel Carina Betsey Holiday, MD as Physician Assistant (Endocrinology) Verner Chol, MD as Consulting Physician (Sports Medicine) Hiram Gash, MD as Consulting Physician (Orthopedic Surgery)  Indicate any recent Medical Services you may have received from other than [facility] providers in the past year (date may be approximate).     Assessment:   This is a routine wellness examination for Milanie.  Hearing/Vision screen No exam data present  Dietary issues and exercise activities discussed: Current Exercise Habits: The patient does not participate in regular exercise at present, Exercise limited by: orthopedic condition(s)  Goals    . DIET - EAT MORE FRUITS AND VEGETABLES     Recommend to eat 2-3 vegetables in daily diet.     Marland Kitchen DIET - INCREASE WATER INTAKE     Recommend increasing water intake to 6-8 8 oz glasses a day.     . Prevent falls     Recommend to remove any items from the home that may cause slips or trips.      Depression Screen PHQ 2/9 Scores 04/26/2020 04/22/2019 07/24/2018 04/18/2018 04/17/2017 04/17/2017 03/23/2016  PHQ - 2 Score 3 0 0 0 0 0 2  PHQ- 9 Score 10 - 0 - 2 - 8    Fall Risk Fall Risk  05/03/2020 04/26/2020 04/22/2019 07/24/2018 04/18/2018  Falls in the past year? 1 1 0 Yes No  Comment Tripped - - - -  Number falls in past yr: 0 1 - 1 -  Injury with Fall? 1 1 - Yes -  Comment  Broke left foot and right foot big toe. - - - -  Risk for fall due to : No Fall Risks - - - -  Follow up Falls prevention discussed - - Falls evaluation completed -    Any stairs in or around the home? No  If so, are there any without handrails? No  Home free of loose throw rugs in walkways, pet beds, electrical cords, etc? Yes  Adequate lighting in your home to reduce risk of falls? Yes   ASSISTIVE DEVICES UTILIZED TO PREVENT FALLS:  Life alert? No  Use of a cane, walker or w/c? Yes  Grab bars in the bathroom? No  Shower chair or bench in shower? No  Elevated toilet seat or a handicapped toilet? No   Cognitive Function:     6CIT Screen 04/17/2017  What Year? 0 points  What month? 0 points  What time? 0 points  Count back from 20 0 points  Months in reverse 0 points  Repeat phrase 4 points  Total Score 4    Immunizations Immunization History  Administered Date(s) Administered  . Fluad Quad(high Dose 65+) 07/15/2019  . Influenza, High Dose Seasonal PF 07/22/2015, 07/24/2016, 07/19/2017, 06/12/2018  . Moderna SARS-COVID-2 Vaccination 11/04/2019, 12/02/2019  . Pneumococcal Conjugate-13 08/31/2014  . Pneumococcal Polysaccharide-23 07/11/2007, 01/16/2013  . Tdap 08/25/2008  . Zoster 03/02/2012  . Zoster Recombinat (Shingrix) 05/01/2019, 08/06/2019    TDAP status: Due, Education has been provided regarding the importance of this vaccine. Advised may receive this vaccine at local pharmacy or Health Dept. Aware to provide a  copy of the vaccination record if obtained from local pharmacy or Health Dept. Verbalized acceptance and understanding. Flu Vaccine status: Up to date Pneumococcal vaccine status: Up to date Covid-19 vaccine status: Completed vaccines  Qualifies for Shingles Vaccine? Yes   Zostavax completed Yes   Shingrix Completed?: Yes  Screening Tests Health Maintenance  Topic Date Due  . INFLUENZA VACCINE  05/02/2020  . TETANUS/TDAP  05/03/2021 (Originally  08/25/2018)  . HEMOGLOBIN A1C  06/13/2020  . MAMMOGRAM  10/14/2020  . FOOT EXAM  12/11/2020  . OPHTHALMOLOGY EXAM  12/11/2020  . DEXA SCAN  06/25/2021  . COLONOSCOPY  05/13/2025  . COVID-19 Vaccine  Completed  . Hepatitis C Screening  Completed  . PNA vac Low Risk Adult  Completed    Health Maintenance  Health Maintenance Due  Topic Date Due  . INFLUENZA VACCINE  05/02/2020    Colorectal cancer screening: Completed 05/14/15. Repeat every 10 years Mammogram status: Completed 10/14/18. Repeat every year Bone Density status: Completed 06/26/19. Results reflect: Bone density results: OSTEOPOROSIS. Repeat every 2 years.  Lung Cancer Screening: (Low Dose CT Chest recommended if Age 4-80 years, 30 pack-year currently smoking OR have quit w/in 15years.) does qualify however had this completed 06/06/19. Repeat yearly.   Additional Screening:  Hepatitis C Screening: Up to date  Vision Screening: Recommended annual ophthalmology exams for early detection of glaucoma and other disorders of the eye. Is the patient up to date with their annual eye exam?  Yes  Who is the provider or what is the name of the office in which the patient attends annual eye exams? Dr Wallace Going @ [place] If pt is not established with a provider, would they like to be referred to a provider to establish care? No .   Dental Screening: Recommended annual dental exams for proper oral hygiene  Community Resource Referral / Chronic Care Management: CRR required this visit?  No   CCM required this visit?  No      Plan:     I have personally reviewed and noted the following in the patient's chart:   . Medical and social history . Use of alcohol, tobacco or illicit drugs  . Current medications and supplements . Functional ability and status . Nutritional status . Physical activity . Advanced directives . List of other physicians . Hospitalizations, surgeries, and ER visits in previous 12  months . Vitals . Screenings to include cognitive, depression, and falls . Referrals and appointments  In addition, I have reviewed and discussed with patient certain preventive protocols, quality metrics, and best practice recommendations. A written personalized care plan for preventive services as well as general preventive health recommendations were provided to patient.     Tequilla Cousineau [place], [place]   0/11/1115   Nurse Notes: None.

## 2020-04-30 ENCOUNTER — Other Ambulatory Visit: Payer: Self-pay | Admitting: *Deleted

## 2020-04-30 DIAGNOSIS — N390 Urinary tract infection, site not specified: Secondary | ICD-10-CM

## 2020-04-30 MED ORDER — NITROFURANTOIN MONOHYD MACRO 100 MG PO CAPS: 100.0000 mg | ORAL_CAPSULE | Freq: Two times a day (BID) | ORAL | 0 refills | Status: DC

## 2020-04-30 NOTE — Assessment & Plan Note (Signed)
LDL goal less than 70.

## 2020-04-30 NOTE — Assessment & Plan Note (Signed)
Controlled on losartan

## 2020-05-01 ENCOUNTER — Other Ambulatory Visit: Payer: Self-pay

## 2020-05-01 ENCOUNTER — Emergency Department (HOSPITAL_COMMUNITY)
Admission: EM | Admit: 2020-05-01 | Discharge: 2020-05-01 | Disposition: A | Discharge status: Home / Self Care | Payer: Medicare HMO | Attending: Emergency Medicine | Admitting: Emergency Medicine

## 2020-05-01 ENCOUNTER — Emergency Department (HOSPITAL_COMMUNITY): Payer: Medicare HMO

## 2020-05-01 ENCOUNTER — Encounter (HOSPITAL_COMMUNITY): Payer: Self-pay

## 2020-05-01 DIAGNOSIS — Z87891 Personal history of nicotine dependence: Secondary | ICD-10-CM | POA: Insufficient documentation

## 2020-05-01 DIAGNOSIS — Z79899 Other long term (current) drug therapy: Secondary | ICD-10-CM | POA: Insufficient documentation

## 2020-05-01 DIAGNOSIS — M25461 Effusion, right knee: Secondary | ICD-10-CM | POA: Diagnosis not present

## 2020-05-01 DIAGNOSIS — S92002A Unspecified fracture of left calcaneus, initial encounter for closed fracture: Secondary | ICD-10-CM | POA: Diagnosis not present

## 2020-05-01 DIAGNOSIS — S92424A Nondisplaced fracture of distal phalanx of right great toe, initial encounter for closed fracture: Secondary | ICD-10-CM | POA: Diagnosis not present

## 2020-05-01 DIAGNOSIS — S92102A Unspecified fracture of left talus, initial encounter for closed fracture: Secondary | ICD-10-CM | POA: Diagnosis not present

## 2020-05-01 DIAGNOSIS — I10 Essential (primary) hypertension: Secondary | ICD-10-CM | POA: Diagnosis not present

## 2020-05-01 DIAGNOSIS — S8991XA Unspecified injury of right lower leg, initial encounter: Secondary | ICD-10-CM | POA: Diagnosis not present

## 2020-05-01 DIAGNOSIS — Y9301 Activity, walking, marching and hiking: Secondary | ICD-10-CM | POA: Diagnosis not present

## 2020-05-01 DIAGNOSIS — Y92000 Kitchen of unspecified non-institutional (private) residence as  the place of occurrence of the external cause: Secondary | ICD-10-CM | POA: Diagnosis not present

## 2020-05-01 DIAGNOSIS — E109 Type 1 diabetes mellitus without complications: Secondary | ICD-10-CM | POA: Diagnosis not present

## 2020-05-01 DIAGNOSIS — S92414A Nondisplaced fracture of proximal phalanx of right great toe, initial encounter for closed fracture: Secondary | ICD-10-CM | POA: Diagnosis not present

## 2020-05-01 DIAGNOSIS — Y999 Unspecified external cause status: Secondary | ICD-10-CM | POA: Diagnosis not present

## 2020-05-01 DIAGNOSIS — S8262XA Displaced fracture of lateral malleolus of left fibula, initial encounter for closed fracture: Secondary | ICD-10-CM | POA: Diagnosis not present

## 2020-05-01 DIAGNOSIS — E038 Other specified hypothyroidism: Secondary | ICD-10-CM | POA: Diagnosis not present

## 2020-05-01 DIAGNOSIS — M25472 Effusion, left ankle: Secondary | ICD-10-CM | POA: Diagnosis not present

## 2020-05-01 DIAGNOSIS — W19XXXA Unspecified fall, initial encounter: Secondary | ICD-10-CM | POA: Insufficient documentation

## 2020-05-01 DIAGNOSIS — M7989 Other specified soft tissue disorders: Secondary | ICD-10-CM | POA: Diagnosis not present

## 2020-05-01 DIAGNOSIS — S9032XA Contusion of left foot, initial encounter: Secondary | ICD-10-CM | POA: Diagnosis not present

## 2020-05-01 DIAGNOSIS — S9002XA Contusion of left ankle, initial encounter: Secondary | ICD-10-CM | POA: Diagnosis not present

## 2020-05-01 DIAGNOSIS — Z794 Long term (current) use of insulin: Secondary | ICD-10-CM | POA: Insufficient documentation

## 2020-05-01 DIAGNOSIS — S92125A Nondisplaced fracture of body of left talus, initial encounter for closed fracture: Secondary | ICD-10-CM | POA: Diagnosis not present

## 2020-05-01 DIAGNOSIS — S92022A Displaced fracture of anterior process of left calcaneus, initial encounter for closed fracture: Secondary | ICD-10-CM | POA: Diagnosis not present

## 2020-05-01 DIAGNOSIS — Z7982 Long term (current) use of aspirin: Secondary | ICD-10-CM | POA: Insufficient documentation

## 2020-05-01 DIAGNOSIS — S92012A Displaced fracture of body of left calcaneus, initial encounter for closed fracture: Secondary | ICD-10-CM | POA: Diagnosis not present

## 2020-05-01 DIAGNOSIS — S99921A Unspecified injury of right foot, initial encounter: Secondary | ICD-10-CM | POA: Diagnosis present

## 2020-05-01 LAB — CBG MONITORING, ED: Glucose-Capillary: 164 mg/dL — ABNORMAL HIGH (ref 70–99)

## 2020-05-01 LAB — CULTURE, URINE COMPREHENSIVE

## 2020-05-01 MED ORDER — HYDROCODONE-ACETAMINOPHEN 5-325 MG PO TABS: 1.0000 | ORAL_TABLET | ORAL | 0 refills | Status: DC | PRN

## 2020-05-01 MED ORDER — OXYCODONE-ACETAMINOPHEN 5-325 MG PO TABS
1.0000 | ORAL_TABLET | Freq: Once | ORAL | Status: AC
  Administered 2020-05-01: 1 via ORAL
  Filled 2020-05-01: qty 1

## 2020-05-01 NOTE — ED Provider Notes (Signed)
[facility] Provider Note   CSN: [id number] Arrival date & time: 05/01/20  1429     History Chief Complaint  Patient presents with  . Fall    Debbie Dennis is a 74 y.o. female with a history as outlined below, including diabetes, GERD, hypertension and osteopenia presenting for evaluation of injury sustained in a near fall 2 days ago.  She was walking in her kitchen when she simply twisted her left foot and ankle, she stumbled and has had bilateral foot and toe pain since this event.  She did not hit the floor, rather caught herself from falling.  She has significant bruising at the base of her right great toe with significant pain with any attempts at movement.  She also has swelling and pain of her right knee which she suspects twisted while she was stumbling.  She also reports pain at her left dorsal foot since the event.  She has been unable to ambulate without assistance since this occurred.  She does have a walker chair which she has been using around her home.  She had an ORIF procedure of her right ankle January 2021 by Dr. Griffin Basil secondary to a trimalleoli fracture.  She had several hydrocodone tablets left over which she has taken with some improvement in her pain.  Denies numbness in her feet or toes.  HPI     Past Medical History:  Diagnosis Date  . Adaptive colitis   . Anxiety   . Arthritis   . Bronchitis    CHRONIC  . Buedinger-Ludloff-Laewen disease   . Complication of anesthesia    hard to put to sleep-needs more meds  . Diabetes mellitus without complication (HCC)    IDDM  . GERD (gastroesophageal reflux disease)   . Headache    MIGRAINES  . Hypertension   . Hypothyroidism   . Restless leg syndrome     Patient Active Problem List   Diagnosis Date Noted  . Anxiety 02/05/2015  . Allergic rhinitis 02/05/2015  . Arthritis 02/05/2015  . 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 Insulin dependent type 1 diabetes mellitus ([place])  02/05/2015  . Bloodgood disease 02/05/2015  . Acid reflux 02/05/2015  . HLD (hyperlipidemia) 02/05/2015  . BP (high blood pressure) 02/05/2015  . Adult hypothyroidism 02/05/2015  . Adaptive colitis 02/05/2015  . Osteopenia 02/05/2015  . Restless leg 02/05/2015  . History of tobacco abuse 02/05/2015  . Venous stasis syndrome 02/05/2015    Past Surgical History:  Procedure Laterality Date  . ABDOMINAL HYSTERECTOMY     has ovaries-due to abnormal pap smears  . BREAST EXCISIONAL BIOPSY Left 1988  . BREAST SURGERY     lumpectomy  . CATARACT EXTRACTION W/PHACO Left 01/20/2016   Procedure: CATARACT EXTRACTION PHACO AND INTRAOCULAR LENS PLACEMENT (IOC);  Surgeon: Leandrew Koyanagi, MD;  Location: [facility] ORS;  Service: Ophthalmology;  Laterality: Left;  Lot # [id number] H US:02:01:5 AP:20.2% CDE: 24.51  . CATARACT EXTRACTION W/PHACO Right 11/28/2016   Procedure: CATARACT EXTRACTION PHACO AND INTRAOCULAR LENS PLACEMENT (IOC);  Surgeon: Leandrew Koyanagi, MD;  Location: [facility] ORS;  Service: Ophthalmology;  Laterality: Right;  [place] 01:01 AP% 18.3 CDE 11.26 Fluid pack lot # [id number]  . COLONOSCOPY WITH PROPOFOL N/A 05/14/2015   Procedure: COLONOSCOPY WITH PROPOFOL;  Surgeon: Josefine Class, MD;  Location: [facility] ENDOSCOPY;  Service: Endoscopy;  Laterality: N/A;  . EYE SURGERY     cataract left  . ORIF ANKLE FRACTURE Right 10/24/2019   Procedure: OPEN REDUCTION INTERNAL FIXATION (ORIF)  TRIMALLEOLAR ANKLE FRACTURE;  Surgeon: Hiram Gash, MD;  Location: [facility];  Service: Orthopedics;  Laterality: Right;     OB History    Gravida  1   Para  1   Term      Preterm      AB      Living        SAB      TAB      Ectopic      Multiple      Live Births              Family History  Problem Relation Age of Onset  . Cancer Mother        spread into her lungs-died from heart issues   . Heart attack Mother   . COPD Father   . Throat cancer Father   . Heart  disease Father   . Arthritis Sister   . Breast cancer Sister 31  . Lung cancer Brother   . Asthma Maternal Grandfather     Social History   Tobacco Use  . Smoking status: Former Smoker    Packs/day: 1.00    Years: 35.00    Pack years: 35.00    Types: Cigarettes    Quit date: 10/02/2012    Years since quitting: 7.5  . Smokeless tobacco: Never Used  Vaping Use  . Vaping Use: Never used  Substance Use Topics  . Alcohol use: No  . Drug use: No    Home Medications Prior to Admission medications   Medication Sig Start Date End Date Taking? Authorizing Provider  ALPRAZolam (XANAX) 0.5 MG tablet TAKE 1 TO 2 TABLETS BY MOUTH ONCE DAILY AS NEEDED Patient taking differently: Take 1 mg by mouth at bedtime.  01/12/20  Yes Jerrol Banana., MD  aspirin EC 81 MG tablet 81 mg at bedtime. Swallow whole.    Yes [provider]  CALCIUM PO Take 600 mg by mouth at bedtime.    Yes [provider]  Cholecalciferol (VITAMIN D) 50 MCG (2000 UT) tablet Take 4,000 Units by mouth at bedtime.    Yes [provider]  fluticasone (FLONASE) 50 MCG/ACT nasal spray Place 2 sprays into both nostrils daily as needed for allergies.    Yes [provider]  gabapentin (NEURONTIN) 100 MG capsule TAKE 1 CAPSULE BY MOUTH THREE TIMES DAILY Patient taking differently: Take 300 mg by mouth at bedtime.  12/10/19  Yes Jerrol Banana., MD  insulin aspart (NOVOLOG) 100 UNIT/ML injection INJECT UP TO 60 UNITS SUBCUTANEOUSLY DAILY PER  SLIDING  SCALE Patient taking differently: Inject 1-60 Units into the skin See admin instructions. INJECT UP TO 60 UNITS SUBCUTANEOUSLY DAILY PER  SLIDING  SCALE 02/06/20  Yes Jerrol Banana., MD  Insulin Glargine, 2 Unit Dial, (TOUJEO MAX SOLOSTAR) 300 UNIT/ML SOPN Inject 6 Units into the skin at bedtime.    Yes [provider]  levothyroxine (SYNTHROID) 50 MCG tablet Take 1 tablet by mouth once daily Patient taking differently: Take  50 mcg by mouth daily before breakfast.  08/12/19  Yes Jerrol Banana., MD  loratadine (CLARITIN) 10 MG tablet Take 1 tablet (10 mg total) by mouth daily. Patient taking differently: Take 10 mg by mouth at bedtime.  03/19/20  Yes Jerrol Banana., MD  losartan (COZAAR) 25 MG tablet TAKE 1 TABLET BY MOUTH ONCE DAILY NEEDS  OFFICE  VISIT  FOR  MORE  REFILLS  Patient taking differently: Take 25 mg by mouth at bedtime.  04/17/20  Yes Jerrol Banana., MD  Romosozumab-aqqg (EVENITY) 105 MG/1.17ML SOSY injection Inject 210 mg into the skin every 30 (thirty) days.   Yes [provider]  rosuvastatin (CRESTOR) 5 MG tablet Take 1 tablet (5 mg total) by mouth daily. 12/22/19  Yes Jerrol Banana., MD  topiramate (TOPAMAX) 50 MG tablet Take 3 tablets (150 mg total) by mouth daily. nightly Patient taking differently: Take 150 mg by mouth at bedtime.  07/29/19  Yes Jerrol Banana., MD  Continuous Blood Gluc Receiver (FREESTYLE LIBRE 14 DAY READER) DEVI Use 1 kit every 14 (fourteen) days Patient not taking: Reported on 04/26/2020 01/17/19   [provider]  nitrofurantoin, macrocrystal-monohydrate, (MACROBID) 100 MG capsule Take 1 capsule (100 mg total) by mouth 2 (two) times daily. 04/30/20   Jerrol Banana., MD    Allergies    Codeine and Sulfa antibiotics  Review of Systems   Review of Systems  Constitutional: Negative for fever.  Musculoskeletal: Positive for arthralgias and joint swelling. Negative for myalgias.  Skin: Positive for color change.  Neurological: Negative for weakness and numbness.    Physical Exam Updated Vital Signs BP 107/84   Pulse 76   Temp 98.3 F (36.8 C) (Oral)   Resp 16   Ht 5' 1"  (1.549 m)   Wt 57.6 kg   SpO2 96%   BMI 24.00 kg/m   Physical Exam Constitutional:      Appearance: She is well-developed.  HENT:     Head: Atraumatic.  Cardiovascular:     Pulses:          Dorsalis pedis pulses are 2+ on the right  side and 2+ on the left side.     Comments: Pulses equal bilaterally Musculoskeletal:        General: Tenderness present.     Cervical back: Normal range of motion.     Right knee: Effusion present. Decreased range of motion. No LCL laxity or MCL laxity.     Right foot: Swelling and bony tenderness present. Normal pulse.     Left foot: Bony tenderness present. No swelling.     Comments: Tender to palpation right knee along bilateral meniscus, there is a small effusion appreciated.  No ligament instability.  Base of right great toe is significantly tender to palpation with bruising noted.  Less than 2-second cap refill in toe tip.  No deformity.  Bruising noted of the left lateral dorsal foot and also anterior midfoot with moderate tenderness palpation at the midfoot region.  Skin:    General: Skin is warm and dry.  Neurological:     Mental Status: She is alert.     Sensory: No sensory deficit.     Deep Tendon Reflexes: Reflexes normal.     ED Results / Procedures / Treatments   Labs (all labs ordered are listed, but only abnormal results are displayed) Labs Reviewed  CBG MONITORING, ED - Abnormal; Notable for the following components:      Result Value   Glucose-Capillary 164 (*)    All other components within normal limits    EKG None  Radiology DG Ankle Complete Left  Result Date: 05/01/2020 CLINICAL DATA:  Fall, swelling and bruising EXAM: LEFT ANKLE COMPLETE - 3+ VIEW COMPARISON:  None. FINDINGS: Diffuse mild bony demineralization. There is extensive soft tissue swelling of the ankle most pronounced over the lateral malleolus with a moderate  to large ankle joint effusion. Likely fracture through the talar neck as well as along the lateral aspect of the calcaneus. Question some additional fragmentation versus spurring at the level of the navicular as well. Posterior calcaneal spur is noted. IMPRESSION: 1. Likely fracture through the talar neck. Consider CT for further  characterization. 2. Avulsive type fracture of the lateral calcaneus. 3. Question additional fragmentation versus spurring at the level of the navicular as well. 4. Extensive soft tissue swelling of the ankle most pronounced over the lateral malleolus with a moderate to large ankle joint effusion. Electronically Signed   By: Lovena Le M.D.   On: 05/01/2020 15:23   DG Knee Complete 4 Views Right  Result Date: 05/01/2020 CLINICAL DATA:  Fall, pain. EXAM: RIGHT KNEE - COMPLETE 4+ VIEW COMPARISON:  None. FINDINGS: Osseous alignment is normal. No fracture line or displaced fracture fragment is seen. Probable small joint effusion within the suprapatellar bursa. Superficial soft tissues about the RIGHT knee are unremarkable. IMPRESSION: 1. Probable small joint effusion. 2. No osseous fracture or dislocation. Electronically Signed   By: Franki Cabot M.D.   On: 05/01/2020 15:18   DG Toe Great Right  Result Date: 05/01/2020 CLINICAL DATA:  Fall EXAM: RIGHT GREAT TOE COMPARISON:  None. FINDINGS: Diffuse mild bony demineralization. Nondisplaced transversely oriented fracture through the base of the first distal phalanx with extension into the first interphalangeal joint. Additionally, there is a longitudinally oriented fracture along the medial aspect of the first proximal phalanx with slight comminution towards the base and extension into the first metacarpophalangeal joint. Diffuse soft tissue swelling of the first digit is noted. No other acute osseous injuries are identified. IMPRESSION: 1. Nondisplaced intra-articular fracture through the base of the first distal phalanx extending into the interphalangeal joint. 2. Longitudinally oriented fracture along the medial aspect of the first proximal phalanx with mild comminution towards the base and extension into the first metacarpophalangeal joint. Electronically Signed   By: Lovena Le M.D.   On: 05/01/2020 15:20    Procedures Procedures (including critical  care time)  Medications Ordered in ED Medications  oxyCODONE-acetaminophen (PERCOCET/ROXICET) 5-325 MG per tablet 1 tablet (has no administration in time range)    ED Course  I have reviewed the triage vital signs and the nursing notes.  Pertinent labs & imaging results that were available during my care of the patient were reviewed by me and considered in my medical decision making (see chart for details).    MDM Rules/Calculators/A&P                          Patient with multiple fractures including the right great toe and the left talus, pending CT imaging to better define the nature of this talus fracture.  She was given oxycodone tablet for pain relief pending the study.  Of note, Dr. Griffin Basil is orthopedist  Patient discussed with Dr. Regenia Skeeter who assumes care. Final Clinical Impression(s) / ED Diagnoses Final diagnoses:  Closed nondisplaced fracture of proximal phalanx of right great toe, initial encounter  Closed nondisplaced fracture of left talus, unspecified portion of talus, initial encounter  Knee effusion, right    Rx / DC Orders ED Discharge Orders    None       Landis Martins 05/01/20 2010    Sherwood Gambler, MD 05/02/20 [phone]

## 2020-05-01 NOTE — ED Notes (Signed)
Meal requested from [facility]

## 2020-05-01 NOTE — ED Notes (Signed)
Patient transported to CT 

## 2020-05-01 NOTE — ED Triage Notes (Signed)
Pt reports tripped trying to get to her phone in the kitchen and fell.  C/O pain to r knee, swelling noted, r great toe, and left ankle.    Bruising noted to r great toe, skin slightly dark color on r lower leg than left, reprots has been present since surgery this past January.  Bilateral pedal pulses palpable.  Denies hitting head or loss of consciousness.  Pt also says she thinks her sugar may have been low prior to the fall.  Says she didn't check it, she just ate something.

## 2020-05-03 ENCOUNTER — Other Ambulatory Visit: Payer: Self-pay

## 2020-05-03 ENCOUNTER — Ambulatory Visit (INDEPENDENT_AMBULATORY_CARE_PROVIDER_SITE_OTHER): Payer: Medicare HMO

## 2020-05-03 DIAGNOSIS — Z0000 Encounter for general adult medical examination without abnormal findings: Secondary | ICD-10-CM | POA: Diagnosis not present

## 2020-05-03 NOTE — Patient Instructions (Signed)
Debbie Dennis , Thank you for taking time to come for your Medicare Wellness Visit. I appreciate your ongoing commitment to your health goals. Please review the following plan we discussed and let me know if I can assist you in the future.   Screening recommendations/referrals: Colonoscopy: Up to date, due 05/2025 Mammogram: Up to date, due 10/2020 Bone Density: Up to date, due 06/2021 Recommended yearly ophthalmology/optometry visit for glaucoma screening and checkup Recommended yearly dental visit for hygiene and checkup  Vaccinations: Influenza vaccine: Done 07/15/19 Pneumococcal vaccine: Completed series Tdap vaccine: Currently due, declined at this time.  Shingles vaccine: Completed series    Advanced directives: Advance directive discussed with you today. Even though you declined this today please call our office should you change your mind and we can give you the proper paperwork for you to fill out.  Conditions/risks identified: Fall risk preventatives discussed today. Pt advised to increase vegetable consumption to 2 servings every day.   Next appointment: 10/29/71 @ 2:20 PM with Dr Rosanna Randy    Preventive Care 73 Years and Older, Female Preventive care refers to lifestyle choices and visits with your health care provider that can promote health and wellness. What does preventive care include?  A yearly physical exam. This is also called an annual well check.  Dental exams once or twice a year.  Routine eye exams. Ask your health care provider how often you should have your eyes checked.  Personal lifestyle choices, including:  Daily care of your teeth and gums.  Regular physical activity.  Eating a healthy diet.  Avoiding tobacco and drug use.  Limiting alcohol use.  Practicing safe sex.  Taking low-dose aspirin every day.  Taking vitamin and mineral supplements as recommended by your health care provider. What happens during an annual well check? The services and  screenings done by your health care provider during your annual well check will depend on your age, overall health, lifestyle risk factors, and family history of disease. Counseling  Your health care provider may ask you questions about your:  Alcohol use.  Tobacco use.  Drug use.  Emotional well-being.  Home and relationship well-being.  Sexual activity.  Eating habits.  History of falls.  Memory and ability to understand (cognition).  Work and work Statistician.  Reproductive health. Screening  You may have the following tests or measurements:  Height, weight, and BMI.  Blood pressure.  Lipid and cholesterol levels. These may be checked every 5 years, or more frequently if you are over 73 years old.  Skin check.  Lung cancer screening. You may have this screening every year starting at age 73 if you have a 30-pack-year history of smoking and currently smoke or have quit within the past 15 years.  Fecal occult blood test (FOBT) of the stool. You may have this test every year starting at age 73.  Flexible sigmoidoscopy or colonoscopy. You may have a sigmoidoscopy every 5 years or a colonoscopy every 10 years starting at age 73.  Hepatitis C blood test.  Hepatitis B blood test.  Sexually transmitted disease (STD) testing.  Diabetes screening. This is done by checking your blood sugar (glucose) after you have not eaten for a while (fasting). You may have this done every 1-3 years.  Bone density scan. This is done to screen for osteoporosis. You may have this done starting at age 73.  Mammogram. This may be done every 1-2 years. Talk to your health care provider about how often you should have  regular mammograms. Talk with your health care provider about your test results, treatment options, and if necessary, the need for more tests. Vaccines  Your health care provider may recommend certain vaccines, such as:  Influenza vaccine. This is recommended every  year.  Tetanus, diphtheria, and acellular pertussis (Tdap, Td) vaccine. You may need a Td booster every 10 years.  Zoster vaccine. You may need this after age 73.  Pneumococcal 13-valent conjugate (PCV13) vaccine. One dose is recommended after age 73.  Pneumococcal polysaccharide (PPSV23) vaccine. One dose is recommended after age 76. Talk to your health care provider about which screenings and vaccines you need and how often you need them. This information is not intended to replace advice given to you by your health care provider. Make sure you discuss any questions you have with your health care provider. Document Released: 10/15/2015 Document Revised: 06/07/2016 Document Reviewed: 07/20/2015 Elsevier Interactive Patient Education  2017 [place] Prevention in the Home Falls can cause injuries. They can happen to people of all ages. There are many things you can do to make your home safe and to help prevent falls. What can I do on the outside of my home?  Regularly fix the edges of walkways and driveways and fix any cracks.  Remove anything that might make you trip as you walk through a door, such as a raised step or threshold.  Trim any bushes or trees on the path to your home.  Use bright outdoor lighting.  Clear any walking paths of anything that might make someone trip, such as rocks or tools.  Regularly check to see if handrails are loose or broken. Make sure that both sides of any steps have handrails.  Any raised decks and porches should have guardrails on the edges.  Have any leaves, snow, or ice cleared regularly.  Use sand or salt on walking paths during winter.  Clean up any spills in your garage right away. This includes oil or grease spills. What can I do in the bathroom?  Use night lights.  Install grab bars by the toilet and in the tub and shower. Do not use towel bars as grab bars.  Use non-skid mats or decals in the tub or shower.  If you  need to sit down in the shower, use a plastic, non-slip stool.  Keep the floor dry. Clean up any water that spills on the floor as soon as it happens.  Remove soap buildup in the tub or shower regularly.  Attach bath mats securely with double-sided non-slip rug tape.  Do not have throw rugs and other things on the floor that can make you trip. What can I do in the bedroom?  Use night lights.  Make sure that you have a light by your bed that is easy to reach.  Do not use any sheets or blankets that are too big for your bed. They should not hang down onto the floor.  Have a firm chair that has side arms. You can use this for support while you get dressed.  Do not have throw rugs and other things on the floor that can make you trip. What can I do in the kitchen?  Clean up any spills right away.  Avoid walking on wet floors.  Keep items that you use a lot in easy-to-reach places.  If you need to reach something above you, use a strong step stool that has a grab bar.  Keep electrical cords out of the  way.  Do not use floor polish or wax that makes floors slippery. If you must use wax, use non-skid floor wax.  Do not have throw rugs and other things on the floor that can make you trip. What can I do with my stairs?  Do not leave any items on the stairs.  Make sure that there are handrails on both sides of the stairs and use them. Fix handrails that are broken or loose. Make sure that handrails are as long as the stairways.  Check any carpeting to make sure that it is firmly attached to the stairs. Fix any carpet that is loose or worn.  Avoid having throw rugs at the top or bottom of the stairs. If you do have throw rugs, attach them to the floor with carpet tape.  Make sure that you have a light switch at the top of the stairs and the bottom of the stairs. If you do not have them, ask someone to add them for you. What else can I do to help prevent falls?  Wear shoes  that:  Do not have high heels.  Have rubber bottoms.  Are comfortable and fit you well.  Are closed at the toe. Do not wear sandals.  If you use a stepladder:  Make sure that it is fully opened. Do not climb a closed stepladder.  Make sure that both sides of the stepladder are locked into place.  Ask someone to hold it for you, if possible.  Clearly mark and make sure that you can see:  Any grab bars or handrails.  First and last steps.  Where the edge of each step is.  Use tools that help you move around (mobility aids) if they are needed. These include:  Canes.  Walkers.  Scooters.  Crutches.  Turn on the lights when you go into a dark area. Replace any light bulbs as soon as they burn out.  Set up your furniture so you have a clear path. Avoid moving your furniture around.  If any of your floors are uneven, fix them.  If there are any pets around you, be aware of where they are.  Review your medicines with your doctor. Some medicines can make you feel dizzy. This can increase your chance of falling. Ask your doctor what other things that you can do to help prevent falls. This information is not intended to replace advice given to you by your health care provider. Make sure you discuss any questions you have with your health care provider. Document Released: 07/15/2009 Document Revised: 02/24/2016 Document Reviewed: 10/23/2014 Elsevier Interactive Patient Education  2017 [employer].

## 2020-05-04 ENCOUNTER — Other Ambulatory Visit: Payer: Self-pay | Admitting: Family Medicine

## 2020-05-04 NOTE — Telephone Encounter (Signed)
Requested medication (s) are due for refill today: Due 05/13/2020  Requested medication (s) are on the active medication list: yes  Last refill: 01/12/20  #60 3 refills  Future visit scheduled  Yes 10/28/20  Notes to clinic:not delegated  Requested Prescriptions  Pending Prescriptions Disp Refills   ALPRAZolam (XANAX) 0.5 MG tablet [Pharmacy Med Name: ALPRAZolam 0.5 MG Oral Tablet] 60 tablet 0    Sig: TAKE 1 TO 2 TABLETS BY MOUTH ONCE DAILY AS NEEDED      Not Delegated - Psychiatry:  Anxiolytics/Hypnotics Failed - 05/04/2020  5:30 AM      Failed - This refill cannot be delegated      Failed - Urine Drug Screen completed in last 360 days.      Passed - Valid encounter within last 6 months    Recent Outpatient Visits           1 week ago Annual physical exam   [facility] Jerrol Banana., MD   4 months ago Age-related osteoporosis with current pathological fracture with routine healing, subsequent encounter   [facility] Jerrol Banana., MD   1 year ago History of tobacco abuse   [facility] Jerrol Banana., MD   1 year ago Essential hypertension   [facility] Jerrol Banana., MD   1 year ago Type 1 diabetes mellitus with hyperglycemia [facility])   [facility] Jerrol Banana., MD       Future Appointments             In 5 months Jerrol Banana., MD [facility], [place]

## 2020-05-06 DIAGNOSIS — M25571 Pain in right ankle and joints of right foot: Secondary | ICD-10-CM | POA: Diagnosis not present

## 2020-05-10 ENCOUNTER — Other Ambulatory Visit: Payer: Self-pay | Admitting: Family Medicine

## 2020-05-10 NOTE — Telephone Encounter (Signed)
Medication Refill - Medication: HYDROcodone-acetaminophen (NORCO) 5-325 MG tablet   Patient  Is all out of the medication   Preferred Pharmacy (with phone number or street name):  [place], [place] Phone:  [phone]  Fax:  [phone]       Agent: Please be advised that RX refills may take up to 3 business days. We ask that you follow-up with your pharmacy.

## 2020-05-10 NOTE — Telephone Encounter (Signed)
Requested medication (s) are due for refill today: no  Requested medication (s) are on the active medication list: yes  Last refill:  05/01/2020  Future visit scheduled: yes  Notes to clinic:  this refill cannot be delegated    Requested Prescriptions  Pending Prescriptions Disp Refills   HYDROcodone-acetaminophen (NORCO) 5-325 MG tablet 15 tablet 0    Sig: Take 1 tablet by mouth every 4 (four) hours as needed for severe pain.      Not Delegated - Analgesics:  Opioid Agonist Combinations Failed - 05/10/2020 10:51 AM      Failed - This refill cannot be delegated      Failed - Urine Drug Screen completed in last 360 days.      Passed - Valid encounter within last 6 months    Recent Outpatient Visits           2 weeks ago Annual physical exam   [facility] Jerrol Banana., MD   4 months ago Age-related osteoporosis with current pathological fracture with routine healing, subsequent encounter   [facility] Jerrol Banana., MD   1 year ago History of tobacco abuse   [facility] Jerrol Banana., MD   1 year ago Essential hypertension   [facility] Jerrol Banana., MD   1 year ago Type 1 diabetes mellitus with hyperglycemia [facility])   [facility] Jerrol Banana., MD       Future Appointments             In 5 months Jerrol Banana., MD [facility], [place]

## 2020-05-11 ENCOUNTER — Emergency Department (HOSPITAL_COMMUNITY): Payer: Medicare HMO

## 2020-05-11 ENCOUNTER — Encounter (HOSPITAL_COMMUNITY): Payer: Self-pay

## 2020-05-11 ENCOUNTER — Other Ambulatory Visit: Payer: Self-pay

## 2020-05-11 ENCOUNTER — Emergency Department (HOSPITAL_COMMUNITY)
Admission: EM | Admit: 2020-05-11 | Discharge: 2020-05-11 | Disposition: A | Discharge status: Home / Self Care | Payer: Medicare HMO | Attending: Emergency Medicine | Admitting: Emergency Medicine

## 2020-05-11 DIAGNOSIS — R1084 Generalized abdominal pain: Secondary | ICD-10-CM | POA: Insufficient documentation

## 2020-05-11 DIAGNOSIS — E1165 Type 2 diabetes mellitus with hyperglycemia: Secondary | ICD-10-CM | POA: Diagnosis not present

## 2020-05-11 DIAGNOSIS — Z87891 Personal history of nicotine dependence: Secondary | ICD-10-CM | POA: Diagnosis not present

## 2020-05-11 DIAGNOSIS — E039 Hypothyroidism, unspecified: Secondary | ICD-10-CM | POA: Diagnosis not present

## 2020-05-11 DIAGNOSIS — Z79899 Other long term (current) drug therapy: Secondary | ICD-10-CM | POA: Diagnosis not present

## 2020-05-11 DIAGNOSIS — N3000 Acute cystitis without hematuria: Secondary | ICD-10-CM | POA: Diagnosis not present

## 2020-05-11 DIAGNOSIS — R110 Nausea: Secondary | ICD-10-CM | POA: Insufficient documentation

## 2020-05-11 DIAGNOSIS — E109 Type 1 diabetes mellitus without complications: Secondary | ICD-10-CM | POA: Insufficient documentation

## 2020-05-11 DIAGNOSIS — Z7982 Long term (current) use of aspirin: Secondary | ICD-10-CM | POA: Diagnosis not present

## 2020-05-11 DIAGNOSIS — R197 Diarrhea, unspecified: Secondary | ICD-10-CM | POA: Diagnosis not present

## 2020-05-11 DIAGNOSIS — Z794 Long term (current) use of insulin: Secondary | ICD-10-CM | POA: Insufficient documentation

## 2020-05-11 DIAGNOSIS — I10 Essential (primary) hypertension: Secondary | ICD-10-CM | POA: Diagnosis not present

## 2020-05-11 DIAGNOSIS — R0902 Hypoxemia: Secondary | ICD-10-CM | POA: Diagnosis not present

## 2020-05-11 DIAGNOSIS — Z79890 Hormone replacement therapy (postmenopausal): Secondary | ICD-10-CM | POA: Diagnosis not present

## 2020-05-11 DIAGNOSIS — K6289 Other specified diseases of anus and rectum: Secondary | ICD-10-CM | POA: Diagnosis not present

## 2020-05-11 DIAGNOSIS — K449 Diaphragmatic hernia without obstruction or gangrene: Secondary | ICD-10-CM | POA: Diagnosis not present

## 2020-05-11 DIAGNOSIS — K8020 Calculus of gallbladder without cholecystitis without obstruction: Secondary | ICD-10-CM | POA: Diagnosis not present

## 2020-05-11 DIAGNOSIS — N200 Calculus of kidney: Secondary | ICD-10-CM | POA: Diagnosis not present

## 2020-05-11 LAB — CBC
HCT: 38.8 % (ref 36.0–46.0)
Hemoglobin: 12.3 g/dL (ref 12.0–15.0)
MCH: 32.7 pg (ref 26.0–34.0)
MCHC: 31.7 g/dL (ref 30.0–36.0)
MCV: 103.2 fL — ABNORMAL HIGH (ref 80.0–100.0)
Platelets: 465 10*3/uL — ABNORMAL HIGH (ref 150–400)
RBC: 3.76 MIL/uL — ABNORMAL LOW (ref 3.87–5.11)
RDW: 13.2 % (ref 11.5–15.5)
WBC: 17.1 10*3/uL — ABNORMAL HIGH (ref 4.0–10.5)
nRBC: 0 % (ref 0.0–0.2)

## 2020-05-11 LAB — URINALYSIS, ROUTINE W REFLEX MICROSCOPIC
Bacteria, UA: NONE SEEN
Bilirubin Urine: NEGATIVE
Glucose, UA: 50 mg/dL — AB
Ketones, ur: NEGATIVE mg/dL
Nitrite: NEGATIVE
Protein, ur: NEGATIVE mg/dL
Specific Gravity, Urine: 1.012 (ref 1.005–1.030)
pH: 5 (ref 5.0–8.0)

## 2020-05-11 LAB — COMPREHENSIVE METABOLIC PANEL
ALT: 99 U/L — ABNORMAL HIGH (ref 0–44)
AST: 51 U/L — ABNORMAL HIGH (ref 15–41)
Albumin: 3.3 g/dL — ABNORMAL LOW (ref 3.5–5.0)
Alkaline Phosphatase: 156 U/L — ABNORMAL HIGH (ref 38–126)
Anion gap: 11 (ref 5–15)
BUN: 33 mg/dL — ABNORMAL HIGH (ref 8–23)
CO2: 26 mmol/L (ref 22–32)
Calcium: 9.6 mg/dL (ref 8.9–10.3)
Chloride: 99 mmol/L (ref 98–111)
Creatinine, Ser: 1.94 mg/dL — ABNORMAL HIGH (ref 0.44–1.00)
GFR calc Af Amer: 29 mL/min — ABNORMAL LOW (ref >60)
GFR calc non Af Amer: 25 mL/min — ABNORMAL LOW (ref >60)
Glucose, Bld: 216 mg/dL — ABNORMAL HIGH (ref 70–99)
Potassium: 5 mmol/L (ref 3.5–5.1)
Sodium: 136 mmol/L (ref 135–145)
Total Bilirubin: 0.8 mg/dL (ref 0.3–1.2)
Total Protein: 6.4 g/dL — ABNORMAL LOW (ref 6.5–8.1)

## 2020-05-11 LAB — DIFFERENTIAL
Abs Immature Granulocytes: 0.12 10*3/uL — ABNORMAL HIGH (ref 0.00–0.07)
Basophils Absolute: 0.1 10*3/uL (ref 0.0–0.1)
Basophils Relative: 0 %
Eosinophils Absolute: 0 10*3/uL (ref 0.0–0.5)
Eosinophils Relative: 0 %
Immature Granulocytes: 1 %
Lymphocytes Relative: 12 %
Lymphs Abs: 2.1 10*3/uL (ref 0.7–4.0)
Monocytes Absolute: 1.3 10*3/uL — ABNORMAL HIGH (ref 0.1–1.0)
Monocytes Relative: 8 %
Neutro Abs: 13 10*3/uL — ABNORMAL HIGH (ref 1.7–7.7)
Neutrophils Relative %: 79 %

## 2020-05-11 LAB — CBG MONITORING, ED: Glucose-Capillary: 214 mg/dL — ABNORMAL HIGH (ref 70–99)

## 2020-05-11 LAB — LIPASE, BLOOD: Lipase: 15 U/L (ref 11–51)

## 2020-05-11 LAB — LACTIC ACID, PLASMA: Lactic Acid, Venous: 1.7 mmol/L (ref 0.5–1.9)

## 2020-05-11 MED ORDER — SODIUM CHLORIDE 0.9 % IV BOLUS
1000.0000 mL | Freq: Once | INTRAVENOUS | Status: AC
  Administered 2020-05-11: 1000 mL via INTRAVENOUS

## 2020-05-11 MED ORDER — IOHEXOL 9 MG/ML PO SOLN
ORAL | Status: AC
  Filled 2020-05-11: qty 1000

## 2020-05-11 MED ORDER — CEPHALEXIN 500 MG PO CAPS
500.0000 mg | ORAL_CAPSULE | Freq: Four times a day (QID) | ORAL | 0 refills | Status: DC
Start: 2020-05-11 — End: 2020-07-05

## 2020-05-11 MED ORDER — FENTANYL CITRATE (PF) 100 MCG/2ML IJ SOLN
50.0000 ug | Freq: Once | INTRAMUSCULAR | Status: AC
  Administered 2020-05-11: 50 ug via INTRAVENOUS
  Filled 2020-05-11: qty 2

## 2020-05-11 MED ORDER — ONDANSETRON HCL 4 MG/2ML IJ SOLN
4.0000 mg | Freq: Once | INTRAMUSCULAR | Status: AC
  Administered 2020-05-11: 4 mg via INTRAVENOUS
  Filled 2020-05-11: qty 2

## 2020-05-11 MED ORDER — CEPHALEXIN 500 MG PO CAPS
500.0000 mg | ORAL_CAPSULE | Freq: Once | ORAL | Status: AC
  Administered 2020-05-11: 500 mg via ORAL
  Filled 2020-05-11: qty 1

## 2020-05-11 MED ORDER — HYDROCODONE-ACETAMINOPHEN 5-325 MG PO TABS: 1.0000 | ORAL_TABLET | ORAL | 0 refills | Status: DC | PRN

## 2020-05-11 NOTE — ED Notes (Signed)
Pt urinated in bed pan with husband assistance prior to this nurse's arrival. Unable to obtain specimen due to chucks/tissue and washcloths placed in bedpan.

## 2020-05-11 NOTE — ED Provider Notes (Signed)
[facility] EMERGENCY DEPARTMENT Provider Note   CSN: [id number] Arrival date & time: 05/11/20  7915     History Chief Complaint  Patient presents with   Abdominal Pain    Debbie Dennis is a 74 y.o. female.  She is a poor historian.  She said she has been constipated for the last week after taking pain medicines for her ankle injury.  Her husband helped her with 2 enemas and she said that worked and is having a bowel movement.  She said she was having some crampy abdominal pain.  Concerned she has a bowel obstruction.  A little bit of nausea no vomiting.  No fevers or chills.  Has not seen any bleeding.  The history is provided by the patient.  Abdominal Pain Pain location:  Generalized Pain quality: cramping   Pain radiates to:  Does not radiate Pain severity:  Moderate Onset quality:  Gradual Timing:  Intermittent Progression:  Improving Chronicity:  New Context: laxative use   Relieved by:  Bowel activity Worsened by:  Bowel movements Ineffective treatments:  None tried Associated symptoms: constipation, diarrhea and nausea   Associated symptoms: no chest pain, no dysuria, no fever, no shortness of breath, no sore throat and no vomiting        Past Medical History:  Diagnosis Date   Adaptive colitis    Anxiety    Arthritis    Bronchitis    CHRONIC   Buedinger-Ludloff-Laewen disease    Complication of anesthesia    hard to put to sleep-needs more meds   Diabetes mellitus without complication (HCC)    IDDM   GERD (gastroesophageal reflux disease)    Headache    MIGRAINES   Hypertension    Hypothyroidism    Restless leg syndrome     Patient Active Problem List   Diagnosis Date Noted   Anxiety 02/05/2015   Allergic rhinitis 02/05/2015   Arthritis 02/05/2015   Buedinger-Ludloff-Laewen disease 02/05/2015   Insulin dependent type 1 diabetes mellitus ([place]) 02/05/2015   Bloodgood disease 02/05/2015   Acid reflux 02/05/2015   HLD  (hyperlipidemia) 02/05/2015   BP (high blood pressure) 02/05/2015   Adult hypothyroidism 02/05/2015   Adaptive colitis 02/05/2015   Osteopenia 02/05/2015   Restless leg 02/05/2015   History of tobacco abuse 02/05/2015   Venous stasis syndrome 02/05/2015    Past Surgical History:  Procedure Laterality Date   ABDOMINAL HYSTERECTOMY     has ovaries-due to abnormal pap smears   BREAST EXCISIONAL BIOPSY Left 1988   BREAST SURGERY     lumpectomy   CATARACT EXTRACTION W/PHACO Left 01/20/2016   Procedure: CATARACT EXTRACTION PHACO AND INTRAOCULAR LENS PLACEMENT ([place]);  Surgeon: Leandrew Koyanagi, MD;  Location: [facility] ORS;  Service: Ophthalmology;  Laterality: Left;  Lot # [id number] H US:02:01:5 AP:20.2% CDE: 24.51   CATARACT EXTRACTION W/PHACO Right 11/28/2016   Procedure: CATARACT EXTRACTION PHACO AND INTRAOCULAR LENS PLACEMENT ([facility]);  Surgeon: Leandrew Koyanagi, MD;  Location: [facility] ORS;  Service: Ophthalmology;  Laterality: Right;  [place] 01:01 AP% 18.3 CDE 11.26 Fluid pack lot # [id number]   COLONOSCOPY WITH PROPOFOL N/A 05/14/2015   Procedure: COLONOSCOPY WITH PROPOFOL;  Surgeon: Josefine Class, MD;  Location: [facility] END[facility]COPY;  Service: Endoscopy;  Laterality: N/A;   EYE SURGERY     cataract left   ORIF ANKLE FRACTURE Right 10/24/2019   Procedure: OPEN REDUCTION INTERNAL FIXATION (ORIF) TRIMALLEOLAR ANKLE FRACTURE;  Surgeon: Hiram Gash, MD;  Location: [place];  Service: Orthopedics;  Laterality: Right;     OB History    Gravida  1   Para  1   Term      Preterm      AB      Living        SAB      TAB      Ectopic      Multiple      Live Births              Family History  Problem Relation Age of Onset   Cancer Mother        spread into her lungs-died from heart issues    Heart attack Mother    COPD Father    Throat cancer Father    Heart disease Father    Arthritis Sister    Breast cancer Sister 38   Lung  cancer Brother    Asthma Maternal Grandfather     Social History   Tobacco Use   Smoking status: Former Smoker    Packs/day: 1.00    Years: 35.00    Pack years: 35.00    Types: Cigarettes    Quit date: 10/02/2012    Years since quitting: 7.6   Smokeless tobacco: Never Used  Vaping Use   Vaping Use: Never used  Substance Use Topics   Alcohol use: No   Drug use: No    Home Medications Prior to Admission medications   Medication Sig Start Date End Date Taking? Authorizing Provider  ALPRAZolam Duanne Moron) 0.5 MG tablet Take 1 tablet (0.5 mg total) by mouth 2 (two) times daily as needed for anxiety. 05/04/20   Jerrol Banana., MD  aspirin EC 81 MG tablet 81 mg at bedtime. Swallow whole.     [provider]  CALCIUM PO Take 600 mg by mouth at bedtime.     [provider]  Cholecalciferol (VITAMIN D) 50 MCG (2000 UT) tablet Take 4,000 Units by mouth at bedtime.     [provider]  Continuous Blood Gluc Receiver (FREESTYLE LIBRE 14 DAY READER) DEVI Use 1 kit every 14 (fourteen) days 01/17/19   [provider]  fluticasone (FLONASE) 50 MCG/ACT nasal spray Place 2 sprays into both nostrils daily as needed for allergies.     [provider]  gabapentin (NEURONTIN) 100 MG capsule TAKE 1 CAPSULE BY MOUTH THREE TIMES DAILY Patient taking differently: Take 300 mg by mouth at bedtime.  12/10/19   Jerrol Banana., MD  HYDROcodone-acetaminophen [facility]) 5-325 MG tablet Take 1 tablet by mouth every 4 (four) hours as needed for severe pain. 05/01/20   Sherwood Gambler, MD  insulin aspart (NOVOLOG) 100 UNIT/ML injection INJECT UP TO 60 UNITS SUBCUTANEOUSLY DAILY PER  SLIDING  SCALE Patient taking differently: Inject 1-60 Units into the skin See admin instructions. INJECT UP TO 60 UNITS SUBCUTANEOUSLY DAILY PER  SLIDING  SCALE 02/06/20   Jerrol Banana., MD  Insulin Glargine, 2 Unit Dial, (TOUJEO MAX SOLOSTAR) 300 UNIT/ML SOPN Inject 6 Units  into the skin at bedtime.     [provider]  levothyroxine (SYNTHROID) 50 MCG tablet Take 1 tablet by mouth once daily Patient taking differently: Take 50 mcg by mouth daily before breakfast.  08/12/19   Jerrol Banana., MD  loratadine (CLARITIN) 10 MG tablet Take 1 tablet (10 mg total) by mouth daily. Patient taking differently: Take 10 mg by mouth at bedtime.  03/19/20   Jerrol Banana.,  MD  losartan (COZAAR) 25 MG tablet TAKE 1 TABLET BY MOUTH ONCE DAILY NEEDS  OFFICE  VISIT  FOR  MORE  REFILLS Patient taking differently: Take 25 mg by mouth at bedtime.  04/17/20   Jerrol Banana., MD  nitrofurantoin, macrocrystal-monohydrate, (MACROBID) 100 MG capsule Take 1 capsule (100 mg total) by mouth 2 (two) times daily. 04/30/20   Jerrol Banana., MD  Romosozumab-aqqg (EVENITY) 105 MG/1.17ML SOSY injection Inject 210 mg into the skin every 30 (thirty) days.    [provider]  rosuvastatin (CRESTOR) 5 MG tablet Take 1 tablet (5 mg total) by mouth daily. 12/22/19   Jerrol Banana., MD  topiramate (TOPAMAX) 50 MG tablet Take 3 tablets (150 mg total) by mouth daily. nightly Patient taking differently: Take 150 mg by mouth at bedtime.  07/29/19   Jerrol Banana., MD    Allergies    Codeine and Sulfa antibiotics  Review of Systems   Review of Systems  Constitutional: Negative for fever.  HENT: Negative for sore throat.   Eyes: Negative for visual disturbance.  Respiratory: Negative for shortness of breath.   Cardiovascular: Negative for chest pain.  Gastrointestinal: Positive for abdominal pain, constipation, diarrhea and nausea. Negative for vomiting.  Genitourinary: Negative for dysuria.  Musculoskeletal: Negative for neck pain.  Skin: Negative for rash.  Neurological: Negative for headaches.    Physical Exam Updated Vital Signs BP (!) 100/52 (BP Location: Left Arm)    Pulse (!) 106    Temp 98.5 F (36.9 C) (Oral)    Resp 16    Ht 5'  1" (1.549 m)    Wt 57 kg    SpO2 97%    BMI 23.74 kg/m   Physical Exam Vitals and nursing note reviewed.  Constitutional:      General: She is not in acute distress.    Appearance: Normal appearance. She is well-developed.  HENT:     Head: Normocephalic and atraumatic.  Eyes:     Conjunctiva/sclera: Conjunctivae normal.  Cardiovascular:     Rate and Rhythm: Normal rate and regular rhythm.     Heart sounds: No murmur heard.   Pulmonary:     Effort: Pulmonary effort is normal. No respiratory distress.     Breath sounds: Normal breath sounds.  Abdominal:     Palpations: Abdomen is soft.     Tenderness: There is generalized abdominal tenderness. There is no guarding or rebound.  Musculoskeletal:        General: Normal range of motion.     Cervical back: Neck supple.     Right lower leg: No edema.     Left lower leg: No edema.  Skin:    General: Skin is warm and dry.     Capillary Refill: Capillary refill takes less than 2 seconds.  Neurological:     General: No focal deficit present.     Mental Status: She is alert.     ED Results / Procedures / Treatments   Labs (all labs ordered are listed, but only abnormal results are displayed) Labs Reviewed  COMPREHENSIVE METABOLIC PANEL - Abnormal; Notable for the following components:      Result Value   Glucose, Bld 216 (*)    BUN 33 (*)    Creatinine, Ser 1.94 (*)    Total Protein 6.4 (*)    Albumin 3.3 (*)    AST 51 (*)    ALT 99 (*)    Alkaline Phosphatase  156 (*)    GFR calc non Af Amer 25 (*)    GFR calc Af Amer 29 (*)    All other components within normal limits  CBC - Abnormal; Notable for the following components:   WBC 17.1 (*)    RBC 3.76 (*)    MCV 103.2 (*)    Platelets 465 (*)    All other components within normal limits  DIFFERENTIAL - Abnormal; Notable for the following components:   Neutro Abs 13.0 (*)    Monocytes Absolute 1.3 (*)    Abs Immature Granulocytes 0.12 (*)    All other components within  normal limits  CBG MONITORING, ED - Abnormal; Notable for the following components:   Glucose-Capillary 214 (*)    All other components within normal limits  LIPASE, BLOOD  LACTIC ACID, PLASMA  URINALYSIS, ROUTINE W REFLEX MICROSCOPIC    EKG None  Radiology CT Abdomen Pelvis Wo Contrast  Result Date: 05/11/2020 CLINICAL DATA:  Abdominal pain, abdominal distension EXAM: CT ABDOMEN AND PELVIS WITHOUT CONTRAST TECHNIQUE: Multidetector CT imaging of the abdomen and pelvis was performed following the standard protocol without IV contrast. COMPARISON:  None. FINDINGS: Lower chest: Mild bibasilar atelectasis. The visualized heart and pericardium are unremarkable. Moderate hiatal hernia. Hepatobiliary: Cholelithiasis. Multiple tiny hypodensities are seen scattered throughout the liver measuring up to 11 mm which are poorly characterized on this examination in absence of contrast administration but likely represent tiny hepatic cysts. The liver is otherwise unremarkable. No intra or extrahepatic biliary ductal dilation. Pancreas: Unremarkable Spleen: Unremarkable Adrenals/Urinary Tract: The adrenal glands are unremarkable. The kidneys are normal in size and position. At least 2 left-sided and a single right-sided nonobstructing calculi are seen within the lower poles of the kidneys measuring up to 3 mm. No ureteral calculi. No hydronephrosis. The bladder is mildly distended, but is otherwise unremarkable. Stomach/Bowel: The stomach,, small bowel, and large bowel are unremarkable save for mild sigmoid diverticulosis. The appendix is not visualized and may be absent. No free intraperitoneal gas or fluid. The rectal vault is distended with large amount of stool and there is mild perirectal inflammatory stranding suggesting changes of stercoral proctitis. Vascular/Lymphatic: There is moderate aortoiliac atherosclerotic calcification present without evidence of aneurysm. No pathologic adenopathy within the abdomen  and pelvis. Reproductive: Uterus absent.  No adnexal masses. Other: None significant Musculoskeletal: Degenerative changes are seen within the lumbar spine. No lytic or blastic bone lesions are seen. IMPRESSION: 1. The rectal vault is distended with large amount of stool and there is mild perirectal inflammatory stranding suggesting changes of stercoral proctitis. Correlation for fecal impaction may be helpful. 2. Bilateral nonobstructing renal calculi. 3. Cholelithiasis. 4. Moderate hiatal hernia. 5. Aortic atherosclerosis. Aortic Atherosclerosis (ICD10-I70.0). Electronically Signed   By: Fidela Salisbury MD   On: 05/11/2020 15:58   US Abdomen Limited  Result Date: 05/11/2020 CLINICAL DATA:  Cholelithiasis, elevated liver function tests EXAM: ULTRASOUND ABDOMEN LIMITED RIGHT UPPER QUADRANT COMPARISON:  None. FINDINGS: Gallbladder: Several mobile gallstones are seen within the gallbladder lumen. The gallbladder, however, is not distended, there is no gallbladder wall thickening, and no pericholecystic fluid is identified. The sonographic Percell Miller sign is reportedly negative. Common bile duct: Diameter: 3 mm in proximal diameter Liver: No focal lesion identified. Within normal limits in parenchymal echogenicity. Portal vein is patent on color Doppler imaging with normal direction of blood flow towards the liver. Other: No ascites IMPRESSION: Cholelithiasis without sonographic evidence of acute cholecystitis. Electronically Signed   By: Fidela Salisbury  MD   On: 05/11/2020 17:14    Procedures Procedures (including critical care time)  Medications Ordered in ED Medications  sodium chloride 0.9 % bolus 1,000 mL (has no administration in time range)  ondansetron (ZOFRAN) injection 4 mg (has no administration in time range)  fentaNYL (SUBLIMAZE) injection 50 mcg (has no administration in time range)    ED Course  I have reviewed the triage vital signs and the nursing notes.  Pertinent labs & imaging results  that were available during my care of the patient were reviewed by me and considered in my medical decision making (see chart for details).    MDM Rules/Calculators/A&P                         This patient complains of constipation abdominal pain; this involves an extensive number of treatment Options and is a complaint that carries with it a high risk of complications and Morbidity. The differential includes constipation, obstruction, diverticulitis, colitis, biliary colic, peptic ulcer disease  I ordered, reviewed and interpreted labs, which included CBC with elevated white count, normal hemoglobin, chemistries with elevated glucose BUN and creatinine, also elevations of LFTs.  Lactate normal. I ordered medication IV fluids nausea and pain medicine with improvement in her symptoms I ordered imaging studies which included CT abdomen and pelvis which was pending at the time of signout  Previous records obtained and reviewed in epic, no recent visits  After the interventions stated above, I reevaluated the patient and found patient to be improved symptomatically.  She is been on the bedpan twice and still passed some stool.  Will need some imaging due to her elevated white count and elevated LFTs.  Story does not fit with biliary colic necessarily.  Signed out to oncoming provider Dr. Venita Sheffield to follow-up on rest of labs and imaging and appropriate disposition.   Final Clinical Impression(s) / ED Diagnoses Final diagnoses:  Generalized abdominal pain    Rx / DC Orders ED Discharge Orders    None       Hayden Rasmussen, MD 05/11/20 1805

## 2020-05-11 NOTE — ED Provider Notes (Signed)
Extensive work-up and long delays to get the urine.  Patient had some abnormal LFTs.  Had no right upper quadrant tenderness CT scan showed gallstones.  Ultrasound was done which showed no evidence of any gallbladder inflammation or infection in the biliary tree was normal.  Urinalysis however was abnormal.  Patient's been on Macrobid.  Urine sent for culture patient given Keflex here and will continue on Keflex.  CT scan of the abdomen raise some concerns for some inflammation in the rectum.  Patient did have some constipation situation overall.  Raise some concern about may be impaction.  Patient's had several bowel movements here and has not bowel movements that are just going around the impaction.  No evidence of any more proximal obstruction.  Patient will be treated for the urinary tract infection she will follow-up with her doctors.  She will return if not improving in 2 days.  Or for any new or worse symptoms.   Fredia Sorrow, MD 05/11/20 2317

## 2020-05-11 NOTE — ED Triage Notes (Signed)
Pt reports had been constipated for the past week.  Reports has used 2 enemas and says now can't stop having bowel movements.  Pt says has abd pain right before having a bowel movement.

## 2020-05-11 NOTE — ED Notes (Signed)
EMS reports cbg 275 pta.

## 2020-05-11 NOTE — Discharge Instructions (Addendum)
Work-up looks like urinary tract infection probably explaining the leukocytosis.  Rest of work-up negative.  Urine sent for culture which help determine what bug.  Stop the Macrobid antibiotic and start the Keflex.  First dose given here tonight.  And then the prescription to pick up tomorrow.  Take that for 7 days.  Would expect improvement over the next 1 to 2 days.  Return for any new or worse symptoms.  Make an appointment to follow-up with your doctor.

## 2020-05-13 ENCOUNTER — Other Ambulatory Visit: Payer: Self-pay | Admitting: Family Medicine

## 2020-05-14 LAB — URINE CULTURE: Culture: 2000 — AB

## 2020-05-18 ENCOUNTER — Telehealth: Payer: Self-pay

## 2020-05-18 NOTE — Telephone Encounter (Signed)
Patient's husband was advised. 

## 2020-05-18 NOTE — Telephone Encounter (Signed)
Copied from [place] [id number]. Topic: General - Inquiry >> May 18, 2020  8:46 AM Debbie Dennis wrote: Patient would like to know when her last colonoscopy was and if it is time for another one. Please advise.

## 2020-05-19 ENCOUNTER — Other Ambulatory Visit: Payer: Self-pay

## 2020-05-19 ENCOUNTER — Ambulatory Visit (HOSPITAL_COMMUNITY)
Admission: RE | Admit: 2020-05-19 | Discharge: 2020-05-19 | Disposition: A | Discharge status: Home / Self Care | Payer: Medicare HMO | Source: Ambulatory Visit | Attending: Sports Medicine | Admitting: Sports Medicine

## 2020-05-19 DIAGNOSIS — M810 Age-related osteoporosis without current pathological fracture: Secondary | ICD-10-CM | POA: Diagnosis not present

## 2020-05-19 MED ORDER — ROMOSOZUMAB-AQQG 105 MG/1.17ML ~~LOC~~ SOSY
210.0000 mg | PREFILLED_SYRINGE | SUBCUTANEOUS | Status: DC
  Administered 2020-05-19: 210 mg via SUBCUTANEOUS
  Filled 2020-05-19: qty 2.4

## 2020-05-25 DIAGNOSIS — M25561 Pain in right knee: Secondary | ICD-10-CM | POA: Diagnosis not present

## 2020-05-25 DIAGNOSIS — M810 Age-related osteoporosis without current pathological fracture: Secondary | ICD-10-CM | POA: Diagnosis not present

## 2020-05-25 DIAGNOSIS — R5383 Other fatigue: Secondary | ICD-10-CM | POA: Diagnosis not present

## 2020-05-25 DIAGNOSIS — E559 Vitamin D deficiency, unspecified: Secondary | ICD-10-CM | POA: Diagnosis not present

## 2020-05-27 DIAGNOSIS — M79674 Pain in right toe(s): Secondary | ICD-10-CM | POA: Diagnosis not present

## 2020-05-27 DIAGNOSIS — M25572 Pain in left ankle and joints of left foot: Secondary | ICD-10-CM | POA: Diagnosis not present

## 2020-06-02 ENCOUNTER — Other Ambulatory Visit: Payer: Self-pay | Admitting: Family Medicine

## 2020-06-02 NOTE — Telephone Encounter (Signed)
Requested medication (s) are due for refill today: yes  Requested medication (s) are on the active medication list: {yes  Last refill:  05/04/20  #60  Debbie Dennis  Future visit scheduled: yes  Notes to clinic:  medication not delegated    Requested Prescriptions  Pending Prescriptions Disp Refills   ALPRAZolam (XANAX) 0.5 MG tablet [Pharmacy Med Name: ALPRAZolam 0.5 MG Oral Tablet] 60 tablet 0    Sig: Take 1 tablet by mouth twice daily as needed for anxiety      Not Delegated - Psychiatry:  Anxiolytics/Hypnotics Failed - 06/02/2020 11:22 AM      Failed - This refill cannot be delegated      Failed - Urine Drug Screen completed in last 360 days.      Passed - Valid encounter within last 6 months    Recent Outpatient Visits           1 month ago Annual physical exam   [name] Jerrol Banana., MD   5 months ago Age-related osteoporosis with current pathological fracture with routine healing, subsequent encounter   [name] Jerrol Banana., MD   1 year ago History of tobacco abuse   [name] Jerrol Banana., MD   1 year ago Essential hypertension   [name] Jerrol Banana., MD   1 year ago Type 1 diabetes mellitus with hyperglycemia [name])   [name] Jerrol Banana., MD       Future Appointments             In 4 months Jerrol Banana., MD [name] [place]

## 2020-06-05 ENCOUNTER — Telehealth: Payer: Self-pay | Admitting: *Deleted

## 2020-06-05 DIAGNOSIS — Z122 Encounter for screening for malignant neoplasm of respiratory organs: Secondary | ICD-10-CM

## 2020-06-05 DIAGNOSIS — Z87891 Personal history of nicotine dependence: Secondary | ICD-10-CM

## 2020-06-05 NOTE — Telephone Encounter (Signed)
Left a voicemail to inform patient that her lung cancer screening CT scan is currently due. Instructed patient to call back to update information and get CT scan scheduled.

## 2020-06-16 ENCOUNTER — Ambulatory Visit (HOSPITAL_COMMUNITY)
Admission: RE | Admit: 2020-06-16 | Discharge: 2020-06-16 | Disposition: A | Discharge status: Home / Self Care | Payer: Medicare HMO | Source: Ambulatory Visit | Attending: Sports Medicine | Admitting: Sports Medicine

## 2020-06-16 ENCOUNTER — Other Ambulatory Visit: Payer: Self-pay

## 2020-06-16 DIAGNOSIS — M810 Age-related osteoporosis without current pathological fracture: Secondary | ICD-10-CM | POA: Diagnosis not present

## 2020-06-16 MED ORDER — ROMOSOZUMAB-AQQG 105 MG/1.17ML ~~LOC~~ SOSY
210.0000 mg | PREFILLED_SYRINGE | SUBCUTANEOUS | Status: DC
  Administered 2020-06-16: 210 mg via SUBCUTANEOUS
  Filled 2020-06-16: qty 2.4

## 2020-06-21 ENCOUNTER — Other Ambulatory Visit: Payer: Self-pay | Admitting: Family Medicine

## 2020-06-21 DIAGNOSIS — M79604 Pain in right leg: Secondary | ICD-10-CM

## 2020-06-21 DIAGNOSIS — M5431 Sciatica, right side: Secondary | ICD-10-CM

## 2020-06-23 DIAGNOSIS — E1021 Type 1 diabetes mellitus with diabetic nephropathy: Secondary | ICD-10-CM | POA: Diagnosis not present

## 2020-06-23 DIAGNOSIS — N1832 Chronic kidney disease, stage 3b: Secondary | ICD-10-CM | POA: Diagnosis not present

## 2020-06-23 DIAGNOSIS — M810 Age-related osteoporosis without current pathological fracture: Secondary | ICD-10-CM | POA: Diagnosis not present

## 2020-06-23 NOTE — Telephone Encounter (Signed)
Contacted and scheduled. Former smoking, quit 2014, 35 pack year

## 2020-06-23 NOTE — Addendum Note (Signed)
Addended by: Lieutenant Diego on: 06/23/2020 10:36 AM   Modules accepted: Orders

## 2020-06-24 ENCOUNTER — Other Ambulatory Visit: Payer: Self-pay | Admitting: Family Medicine

## 2020-06-24 DIAGNOSIS — D539 Nutritional anemia, unspecified: Secondary | ICD-10-CM | POA: Diagnosis not present

## 2020-06-24 DIAGNOSIS — R5383 Other fatigue: Secondary | ICD-10-CM | POA: Diagnosis not present

## 2020-06-24 DIAGNOSIS — M810 Age-related osteoporosis without current pathological fracture: Secondary | ICD-10-CM | POA: Diagnosis not present

## 2020-06-24 DIAGNOSIS — M25572 Pain in left ankle and joints of left foot: Secondary | ICD-10-CM | POA: Diagnosis not present

## 2020-06-24 DIAGNOSIS — M25571 Pain in right ankle and joints of right foot: Secondary | ICD-10-CM | POA: Diagnosis not present

## 2020-07-05 ENCOUNTER — Inpatient Hospital Stay: Payer: Medicare HMO

## 2020-07-05 ENCOUNTER — Encounter: Payer: Self-pay | Admitting: Internal Medicine

## 2020-07-05 ENCOUNTER — Other Ambulatory Visit: Payer: Self-pay

## 2020-07-05 ENCOUNTER — Inpatient Hospital Stay: Payer: Medicare HMO | Attending: Internal Medicine | Admitting: Internal Medicine

## 2020-07-05 DIAGNOSIS — N183 Chronic kidney disease, stage 3 unspecified: Secondary | ICD-10-CM | POA: Diagnosis not present

## 2020-07-05 DIAGNOSIS — Z87891 Personal history of nicotine dependence: Secondary | ICD-10-CM | POA: Insufficient documentation

## 2020-07-05 DIAGNOSIS — M1990 Unspecified osteoarthritis, unspecified site: Secondary | ICD-10-CM | POA: Diagnosis not present

## 2020-07-05 DIAGNOSIS — G8929 Other chronic pain: Secondary | ICD-10-CM | POA: Insufficient documentation

## 2020-07-05 DIAGNOSIS — K589 Irritable bowel syndrome without diarrhea: Secondary | ICD-10-CM | POA: Diagnosis not present

## 2020-07-05 DIAGNOSIS — K219 Gastro-esophageal reflux disease without esophagitis: Secondary | ICD-10-CM | POA: Diagnosis not present

## 2020-07-05 DIAGNOSIS — E119 Type 2 diabetes mellitus without complications: Secondary | ICD-10-CM | POA: Insufficient documentation

## 2020-07-05 DIAGNOSIS — C801 Malignant (primary) neoplasm, unspecified: Secondary | ICD-10-CM | POA: Insufficient documentation

## 2020-07-05 DIAGNOSIS — M810 Age-related osteoporosis without current pathological fracture: Secondary | ICD-10-CM | POA: Diagnosis not present

## 2020-07-05 DIAGNOSIS — Z801 Family history of malignant neoplasm of trachea, bronchus and lung: Secondary | ICD-10-CM | POA: Insufficient documentation

## 2020-07-05 DIAGNOSIS — Z794 Long term (current) use of insulin: Secondary | ICD-10-CM | POA: Insufficient documentation

## 2020-07-05 DIAGNOSIS — E039 Hypothyroidism, unspecified: Secondary | ICD-10-CM | POA: Insufficient documentation

## 2020-07-05 DIAGNOSIS — I129 Hypertensive chronic kidney disease with stage 1 through stage 4 chronic kidney disease, or unspecified chronic kidney disease: Secondary | ICD-10-CM | POA: Diagnosis not present

## 2020-07-05 DIAGNOSIS — M549 Dorsalgia, unspecified: Secondary | ICD-10-CM | POA: Insufficient documentation

## 2020-07-05 DIAGNOSIS — G2581 Restless legs syndrome: Secondary | ICD-10-CM | POA: Insufficient documentation

## 2020-07-05 DIAGNOSIS — D472 Monoclonal gammopathy: Secondary | ICD-10-CM

## 2020-07-05 DIAGNOSIS — Z7982 Long term (current) use of aspirin: Secondary | ICD-10-CM | POA: Diagnosis not present

## 2020-07-05 DIAGNOSIS — Z8051 Family history of malignant neoplasm of kidney: Secondary | ICD-10-CM | POA: Diagnosis not present

## 2020-07-05 DIAGNOSIS — F419 Anxiety disorder, unspecified: Secondary | ICD-10-CM | POA: Diagnosis not present

## 2020-07-05 DIAGNOSIS — Z79899 Other long term (current) drug therapy: Secondary | ICD-10-CM | POA: Diagnosis not present

## 2020-07-05 DIAGNOSIS — Z803 Family history of malignant neoplasm of breast: Secondary | ICD-10-CM | POA: Insufficient documentation

## 2020-07-05 LAB — COMPREHENSIVE METABOLIC PANEL
ALT: 16 U/L (ref 0–44)
AST: 25 U/L (ref 15–41)
Albumin: 3.8 g/dL (ref 3.5–5.0)
Alkaline Phosphatase: 86 U/L (ref 38–126)
Anion gap: 9 (ref 5–15)
BUN: 24 mg/dL — ABNORMAL HIGH (ref 8–23)
CO2: 27 mmol/L (ref 22–32)
Calcium: 9.1 mg/dL (ref 8.9–10.3)
Chloride: 104 mmol/L (ref 98–111)
Creatinine, Ser: 1.25 mg/dL — ABNORMAL HIGH (ref 0.44–1.00)
GFR calc Af Amer: 49 mL/min — ABNORMAL LOW (ref >60)
GFR calc non Af Amer: 43 mL/min — ABNORMAL LOW (ref >60)
Glucose, Bld: 102 mg/dL — ABNORMAL HIGH (ref 70–99)
Potassium: 4.4 mmol/L (ref 3.5–5.1)
Sodium: 140 mmol/L (ref 135–145)
Total Bilirubin: 0.6 mg/dL (ref 0.3–1.2)
Total Protein: 6.8 g/dL (ref 6.5–8.1)

## 2020-07-05 LAB — LACTATE DEHYDROGENASE: LDH: 148 U/L (ref 98–192)

## 2020-07-05 LAB — CBC WITH DIFFERENTIAL/PLATELET
Abs Immature Granulocytes: 0.02 10*3/uL (ref 0.00–0.07)
Basophils Absolute: 0.1 10*3/uL (ref 0.0–0.1)
Basophils Relative: 1 %
Eosinophils Absolute: 0.1 10*3/uL (ref 0.0–0.5)
Eosinophils Relative: 2 %
HCT: 37 % (ref 36.0–46.0)
Hemoglobin: 12.4 g/dL (ref 12.0–15.0)
Immature Granulocytes: 0 %
Lymphocytes Relative: 32 %
Lymphs Abs: 2.1 10*3/uL (ref 0.7–4.0)
MCH: 33.4 pg (ref 26.0–34.0)
MCHC: 33.5 g/dL (ref 30.0–36.0)
MCV: 99.7 fL (ref 80.0–100.0)
Monocytes Absolute: 0.6 10*3/uL (ref 0.1–1.0)
Monocytes Relative: 9 %
Neutro Abs: 3.7 10*3/uL (ref 1.7–7.7)
Neutrophils Relative %: 56 %
Platelets: 307 10*3/uL (ref 150–400)
RBC: 3.71 MIL/uL — ABNORMAL LOW (ref 3.87–5.11)
RDW: 13.1 % (ref 11.5–15.5)
WBC: 6.6 10*3/uL (ref 4.0–10.5)
nRBC: 0 % (ref 0.0–0.2)

## 2020-07-05 NOTE — Assessment & Plan Note (Signed)
#  Hypogammaglobinemia incidentally noted on blood work [osteoporosis/orthopedics]-recommend further work-up for plasma cell dyscrasia.  Calcium normal at 10.0 hemoglobin normal at 11.8 platelets slightly elevated 428 white count 5.6. Recommend checking CBC CMP LDH multiple myeloma panel; kappa lambda light chain ratio.    # # Discussed with patient and husband that unlikely any major concerns for multiple myeloma or plasma cell dyscrasia at this time.  However await above work-up.  However if work-up is concerning for monoclonal gammopathy-recommend a bone marrow biopsy.   #CKD stage III/diabetes-UNlikely related to multiple myeloma.  Await above work-up.  #Severe osteoporosis-followed by orthopedics.  #History of smoking-lung cancer screening program.  Thank you Dr. Layne Benton for allowing me to participate in the care of your pleasant patient. Please do not hesitate to contact me with questions or concerns in the interim.  # DISPOSITION: call- [phone] [facility] number] # labs today- ordered # follow up TBD-Dr.B

## 2020-07-05 NOTE — Progress Notes (Signed)
[place] NOTE  Patient Care Team: Jerrol Banana., MD as PCP - General (Family Medicine) Leandrew Koyanagi, MD as Referring Physician (Ophthalmology) Gabriel Carina, Betsey Holiday, MD as Physician Assistant (Endocrinology) Verner Chol, MD as Consulting Physician (Sports Medicine) Hiram Gash, MD as Consulting Physician (Orthopedic Surgery)  CHIEF COMPLAINTS/PURPOSE OF CONSULTATION: Possible plasma cell dyscrasia  #Hypogammaglobinemia [sep 2021]-incidental; hemoglobin 11.4 [normal]; calcium 10.0; platelets normal white count normal.   #Severe osteoporosis; diabetes type 2 on insulin; history of smoking Oncology History   No history exists.     HISTORY OF PRESENTING ILLNESS:  Debbie Dennis 74 y.o.  female prior history of smoking and history of severe osteoporosis has been referred to [place] for further evaluation recommendations for possible plasma cell dyscrasia.  Patient states she had a mechanical fall injuring/fracturing her right foot in January 2021; and again fracturing her left foot in summer 2021.  Patient further work-up noted to have severe osteoporosis.  As part of the work-up patient had 40 electrophoresis that showed hypogammaglobinemia.  She is referred to [place] for evaluation of plasma cell dyscrasia/kappa lambda light chain ratio work-up.  Denies any tingling or numbness.  Chronic joint pains back pain not any worse.  Reviewed the records at length-office note/labs from referral providers office/summarized above.    Review of Systems  Constitutional: Negative for chills, diaphoresis, fever, malaise/fatigue and weight loss.  HENT: Negative for nosebleeds and sore throat.   Eyes: Negative for double vision.  Respiratory: Negative for cough, hemoptysis, sputum production, shortness of breath and wheezing.   Cardiovascular: Negative for chest pain, palpitations, orthopnea and leg swelling.  Gastrointestinal: Negative for abdominal pain, blood in  stool, constipation, diarrhea, heartburn, melena, nausea and vomiting.  Genitourinary: Negative for dysuria, frequency and urgency.  Musculoskeletal: Positive for back pain and joint pain.  Skin: Negative.  Negative for itching and rash.  Neurological: Negative for dizziness, tingling, focal weakness, weakness and headaches.  Endo/Heme/Allergies: Does not bruise/bleed easily.  Psychiatric/Behavioral: Negative for depression. The patient is not nervous/anxious and does not have insomnia.      MEDICAL HISTORY:  Past Medical History:  Diagnosis Date  . Adaptive colitis   . Anxiety   . Arthritis   . Bronchitis    CHRONIC  . Buedinger-Ludloff-Laewen disease   . Complication of anesthesia    hard to put to sleep-needs more meds  . Diabetes mellitus without complication (HCC)    IDDM  . GERD (gastroesophageal reflux disease)   . Headache    MIGRAINES  . Hypertension   . Hypothyroidism   . Restless leg syndrome     SURGICAL HISTORY: Past Surgical History:  Procedure Laterality Date  . ABDOMINAL HYSTERECTOMY     has ovaries-due to abnormal pap smears  . BREAST EXCISIONAL BIOPSY Left 1988  . BREAST SURGERY     lumpectomy  . CATARACT EXTRACTION W/PHACO Left 01/20/2016   Procedure: CATARACT EXTRACTION PHACO AND INTRAOCULAR LENS PLACEMENT (IOC);  Surgeon: Leandrew Koyanagi, MD;  Location: [facility] ORS;  Service: Ophthalmology;  Laterality: Left;  Lot # [id number] H US:02:01:5 AP:20.2% CDE: 24.51  . CATARACT EXTRACTION W/PHACO Right 11/28/2016   Procedure: CATARACT EXTRACTION PHACO AND INTRAOCULAR LENS PLACEMENT (IOC);  Surgeon: Leandrew Koyanagi, MD;  Location: [facility] ORS;  Service: Ophthalmology;  Laterality: Right;  [place] 01:01 AP% 18.3 CDE 11.26 Fluid pack lot # [id number]  . COLONOSCOPY WITH PROPOFOL N/A 05/14/2015   Procedure: COLONOSCOPY WITH PROPOFOL;  Surgeon: Josefine Class, MD;  Location: [facility]  ENDOSCOPY;  Service: Endoscopy;  Laterality: N/A;  . EYE SURGERY     cataract left   . ORIF ANKLE FRACTURE Right 10/24/2019   Procedure: OPEN REDUCTION INTERNAL FIXATION (ORIF) TRIMALLEOLAR ANKLE FRACTURE;  Surgeon: Hiram Gash, MD;  Location: [place];  Service: Orthopedics;  Laterality: Right;    SOCIAL HISTORY: Social History   Socioeconomic History  . Marital status: Married    Spouse name: Debbie Dennis  . Number of children: 1  . Years of education: 84  . Highest education level: 12th grade  Occupational History  . Occupation: retired  Tobacco Use  . Smoking status: Former Smoker    Packs/day: 1.00    Years: 35.00    Pack years: 35.00    Types: Cigarettes    Quit date: 10/02/2012    Years since quitting: 7.7  . Smokeless tobacco: Never Used  Vaping Use  . Vaping Use: Never used  Substance and Sexual Activity  . Alcohol use: No  . Drug use: No  . Sexual activity: Not Currently    Birth control/protection: None, Surgical  Other Topics Concern  . Not on file  Social History Narrative   Quit smoking 2014; no alcohol. Worked in [place]; lives in [place].    Social Determinants of Health   Financial Resource Strain: Low Risk   . Difficulty of Paying Living Expenses: Not hard at all  Food Insecurity: No Food Insecurity  . Worried About Charity fundraiser in the Last Year: Never true  . Ran Out of Food in the Last Year: Never true  Transportation Needs: No Transportation Needs  . Lack of Transportation (Medical): No  . Lack of Transportation (Non-Medical): No  Physical Activity: Inactive  . Days of Exercise per Week: 0 days  . Minutes of Exercise per Session: 0 min  Stress: No Stress Concern Present  . Feeling of Stress : Only a little  Social Connections: Moderately Isolated  . Frequency of Communication with Friends and Family: More than three times a week  . Frequency of Social Gatherings with Friends and Family: Twice a week  . Attends Religious Services: Never  . Active Member of Clubs or Organizations: No  . Attends Theatre manager Meetings: Never  . Marital Status: Married  Human resources officer Violence: Not At Risk  . Fear of Current or Ex-Partner: No  . Emotionally Abused: No  . Physically Abused: No  . Sexually Abused: No    FAMILY HISTORY: Family History  Problem Relation Age of Onset  . Cancer Mother        kidney cancer- spread into her lungs-died from heart issues   . Heart attack Mother   . COPD Father   . Throat cancer Father   . Heart disease Father   . Arthritis Sister   . Breast cancer Sister 61  . Lung cancer Brother   . Asthma Maternal Grandfather     ALLERGIES:  is allergic to codeine and sulfa antibiotics.  MEDICATIONS:  Current Outpatient Medications  Medication Sig Dispense Refill  . ALPRAZolam (XANAX) 0.5 MG tablet Take 1 tablet by mouth twice daily as needed for anxiety (Patient taking differently: No sig reported) 60 tablet 1  . aspirin EC 81 MG tablet 81 mg at bedtime. Swallow whole.     Marland Kitchen CALCIUM PO Take 600 mg by mouth at bedtime.     . Continuous Blood Gluc Receiver (FREESTYLE LIBRE 14 DAY READER) DEVI Use 1 kit every 14 (fourteen)  days    . fluticasone (FLONASE) 50 MCG/ACT nasal spray Place 2 sprays into both nostrils daily as needed for allergies.     Marland Kitchen gabapentin (NEURONTIN) 100 MG capsule TAKE 1 CAPSULE BY MOUTH THREE TIMES DAILY 270 capsule 1  . guaiFENesin (MUCINEX) 600 MG 12 hr tablet Take 600 mg by mouth 2 (two) times daily as needed.    . insulin aspart (NOVOLOG) 100 UNIT/ML injection INJECT UP TO 60 UNITS SUBCUTANEOUSLY DAILY PER  SLIDING  SCALE (Patient taking differently: Inject 1-60 Units into the skin See admin instructions. INJECT UP TO 60 UNITS SUBCUTANEOUSLY DAILY PER  SLIDING  SCALE) 90 mL 3  . Insulin Glargine, 2 Unit Dial, (TOUJEO MAX SOLOSTAR) 300 UNIT/ML SOPN Inject 6 Units into the skin at bedtime.     Marland Kitchen levothyroxine (SYNTHROID) 50 MCG tablet Take 1 tablet by mouth once daily (Patient taking differently: Take 50 mcg by mouth daily before breakfast.  ) 90 tablet 3  . loratadine (CLARITIN) 10 MG tablet Take 1 tablet by mouth once daily 90 tablet 0  . losartan (COZAAR) 25 MG tablet TAKE 1 TABLET BY MOUTH ONCE DAILY -  NEED  OFFICE  VIST  FOR  MORE  REFILLS 30 tablet 3  . Menaquinone-7 (VITAMIN K2 PO) Take 1 capsule by mouth daily.    . Romosozumab-aqqg (EVENITY) 105 MG/1.17ML SOSY injection Inject 210 mg into the skin every 30 (thirty) days.    . rosuvastatin (CRESTOR) 5 MG tablet Take 1 tablet (5 mg total) by mouth daily. 90 tablet 3  . topiramate (TOPAMAX) 50 MG tablet Take 3 tablets (150 mg total) by mouth daily. nightly (Patient taking differently: Take 150 mg by mouth at bedtime. ) 90 tablet 11  . VITAMIN D PO Take 2,500 Units by mouth at bedtime.      No current facility-administered medications for this visit.      Marland Kitchen  PHYSICAL EXAMINATION: ECOG PERFORMANCE STATUS: 1 - Symptomatic but completely ambulatory  Vitals:   07/05/20 1355  BP: 131/63  Pulse: 83  Resp: 18  Temp: 98.4 F (36.9 C)  SpO2: 100%   Filed Weights   07/05/20 1401  Weight: 124 lb (56.2 kg)    Physical Exam HENT:     Head: Normocephalic and atraumatic.     Mouth/Throat:     Pharynx: No oropharyngeal exudate.  Eyes:     Pupils: Pupils are equal, round, and reactive to light.  Cardiovascular:     Rate and Rhythm: Normal rate and regular rhythm.  Pulmonary:     Effort: No respiratory distress.     Breath sounds: No wheezing.     Comments: Decreased air entry bilaterally. Abdominal:     General: Bowel sounds are normal. There is no distension.     Palpations: Abdomen is soft. There is no mass.     Tenderness: There is no abdominal tenderness. There is no guarding or rebound.  Musculoskeletal:        General: No tenderness. Normal range of motion.     Cervical back: Normal range of motion and neck supple.  Skin:    General: Skin is warm.  Neurological:     Mental Status: She is alert and oriented to person, place, and time.  Psychiatric:         Mood and Affect: Affect normal.      LABORATORY DATA:  I have reviewed the data as listed Lab Results  Component Value Date   WBC 17.1 (H) 05/11/2020  HGB 12.3 05/11/2020   HCT 38.8 05/11/2020   MCV 103.2 (H) 05/11/2020   PLT 465 (H) 05/11/2020   Recent Labs    10/24/19 1015 04/27/20 0916 05/11/20 0835  NA 141 139 136  K 3.5 4.7 5.0  CL 110 103 99  CO2 18* 22 26  GLUCOSE 278* 151* 216*  BUN 14 26 33*  CREATININE 1.78* 1.38* 1.94*  CALCIUM 9.0 9.6 9.6  GFRNONAA 28* 38* 25*  GFRAA 32* 44* 29*  PROT  --  5.9* 6.4*  ALBUMIN  --  4.0 3.3*  AST  --  17 51*  ALT  --  12 99*  ALKPHOS  --  112 156*  BILITOT  --  0.2 0.8    RADIOGRAPHIC STUDIES: I have personally reviewed the radiological images as listed and agreed with the findings in the report. No results found.  ASSESSMENT & PLAN:   Monoclonal gammopathy #Hypogammaglobinemia incidentally noted on blood work [osteoporosis/orthopedics]-recommend further work-up for plasma cell dyscrasia.  Calcium normal at 10.0 hemoglobin normal at 11.8 platelets slightly elevated 428 white count 5.6. Recommend checking CBC CMP LDH multiple myeloma panel; kappa lambda light chain ratio.    # # Discussed with patient and husband that unlikely any major concerns for multiple myeloma or plasma cell dyscrasia at this time.  However await above work-up.  However if work-up is concerning for monoclonal gammopathy-recommend a bone marrow biopsy.   #CKD stage III/diabetes-UNlikely related to multiple myeloma.  Await above work-up.  #Severe osteoporosis-followed by orthopedics.  #History of smoking-lung cancer screening program.  Thank you Dr. Layne Benton for allowing me to participate in the care of your pleasant patient. Please do not hesitate to contact me with questions or concerns in the interim.  # DISPOSITION: call- [phone] [facility] number] # labs today- ordered # follow up TBD-Dr.B  All questions were answered. The patient knows  to call the clinic with any problems, questions or concerns.    Cammie Sickle, MD 07/05/2020 2:40 PM

## 2020-07-06 LAB — KAPPA/LAMBDA LIGHT CHAINS
Kappa free light chain: 38.3 mg/L — ABNORMAL HIGH (ref 3.3–19.4)
Kappa, lambda light chain ratio: 1.89 — ABNORMAL HIGH (ref 0.26–1.65)
Lambda free light chains: 20.3 mg/L (ref 5.7–26.3)

## 2020-07-07 LAB — MULTIPLE MYELOMA PANEL, SERUM
Albumin SerPl Elph-Mcnc: 3.7 g/dL (ref 2.9–4.4)
Albumin/Glob SerPl: 1.6 (ref 0.7–1.7)
Alpha 1: 0.2 g/dL (ref 0.0–0.4)
Alpha2 Glob SerPl Elph-Mcnc: 0.7 g/dL (ref 0.4–1.0)
B-Globulin SerPl Elph-Mcnc: 0.8 g/dL (ref 0.7–1.3)
Gamma Glob SerPl Elph-Mcnc: 0.7 g/dL (ref 0.4–1.8)
Globulin, Total: 2.4 g/dL (ref 2.2–3.9)
IgA: 73 mg/dL (ref 64–422)
IgG (Immunoglobin G), Serum: 654 mg/dL (ref 586–1602)
IgM (Immunoglobulin M), Srm: 164 mg/dL (ref 26–217)
Total Protein ELP: 6.1 g/dL (ref 6.0–8.5)

## 2020-07-14 ENCOUNTER — Other Ambulatory Visit: Payer: Self-pay

## 2020-07-14 ENCOUNTER — Ambulatory Visit (HOSPITAL_COMMUNITY)
Admission: RE | Admit: 2020-07-14 | Discharge: 2020-07-14 | Disposition: A | Discharge status: Home / Self Care | Payer: Medicare HMO | Source: Ambulatory Visit | Attending: Sports Medicine | Admitting: Sports Medicine

## 2020-07-14 ENCOUNTER — Telehealth: Payer: Self-pay | Admitting: Internal Medicine

## 2020-07-14 DIAGNOSIS — D472 Monoclonal gammopathy: Secondary | ICD-10-CM

## 2020-07-14 DIAGNOSIS — M810 Age-related osteoporosis without current pathological fracture: Secondary | ICD-10-CM | POA: Diagnosis not present

## 2020-07-14 MED ORDER — ROMOSOZUMAB-AQQG 105 MG/1.17ML ~~LOC~~ SOSY
210.0000 mg | PREFILLED_SYRINGE | SUBCUTANEOUS | Status: DC
  Administered 2020-07-14: 210 mg via SUBCUTANEOUS
  Filled 2020-07-14: qty 2.4

## 2020-07-14 NOTE — Addendum Note (Signed)
Addended by: Gloris Ham on: 07/14/2020 09:19 AM   Modules accepted: Orders

## 2020-07-14 NOTE — Telephone Encounter (Signed)
On 10/12-spoke to patient regarding results of the blood work-M protein negative slightly abnormal kappa lambda light chain-clinically not concerning for multiple myeloma. Recommend follow-up in 1 year.  C-schedule follow-up in 1 year-MD; 1 week prior- labs CBC CMP multiple myeloma panel kappa lambda light chain ratio.   Thanks, GB

## 2020-07-15 ENCOUNTER — Other Ambulatory Visit: Payer: Self-pay

## 2020-07-15 ENCOUNTER — Ambulatory Visit
Admission: RE | Admit: 2020-07-15 | Discharge: 2020-07-15 | Disposition: A | Discharge status: Home / Self Care | Payer: Medicare HMO | Source: Ambulatory Visit | Attending: Oncology | Admitting: Oncology

## 2020-07-15 DIAGNOSIS — Z122 Encounter for screening for malignant neoplasm of respiratory organs: Secondary | ICD-10-CM

## 2020-07-15 DIAGNOSIS — Z87891 Personal history of nicotine dependence: Secondary | ICD-10-CM

## 2020-07-19 ENCOUNTER — Other Ambulatory Visit: Payer: Self-pay | Admitting: Family Medicine

## 2020-07-19 NOTE — Telephone Encounter (Signed)
Pt request refill   topiramate (TOPAMAX) 50 MG tablet Pt takes 3/day Pt is requesting 90 day supply  [place], [place] Phone:  [phone]  Fax:  [phone]

## 2020-07-19 NOTE — Telephone Encounter (Signed)
Patient states that she is taking 3 tablets a day. Please review for increased dose

## 2020-07-20 ENCOUNTER — Encounter: Payer: Self-pay | Admitting: *Deleted

## 2020-07-21 ENCOUNTER — Other Ambulatory Visit: Payer: Self-pay | Admitting: *Deleted

## 2020-07-21 NOTE — Telephone Encounter (Signed)
Pt called in about status of refill request/ Mr. Fontenot stated she is taking 3 daily and would like a 90 day supply instead of 30 day supply/ please advise   Pt is out of this medication and asked if it can be called by noon when they will be heading to town where there pharmacy is located

## 2020-07-22 ENCOUNTER — Other Ambulatory Visit: Payer: Self-pay | Admitting: Family Medicine

## 2020-07-22 MED ORDER — TOPIRAMATE 50 MG PO TABS
150.0000 mg | ORAL_TABLET | Freq: Every day | ORAL | 11 refills | Status: DC
Start: 2020-07-22 — End: 2021-07-18

## 2020-07-22 NOTE — Telephone Encounter (Signed)
Requested medication (s) are due for refill today -yes  Requested medication (s) are on the active medication list -yes  Future visit scheduled -yes  Last refill: 06/10/20  Notes to clinic: Possible duplicate Rx request- non delegated rx  Requested Prescriptions  Pending Prescriptions Disp Refills   topiramate (TOPAMAX) 50 MG tablet [Pharmacy Med Name: Topiramate 50 MG Oral Tablet] 90 tablet 0    Sig: TAKE 3 TABLETS BY MOUTH NIGHTLY      Not Delegated - Neurology: Anticonvulsants - topiramate & zonisamide Failed - 07/22/2020  9:07 AM      Failed - This refill cannot be delegated      Failed - Cr in normal range and within 360 days    Creatinine, Ser  Date Value Ref Range Status  07/05/2020 1.25 (H) 0.44 - 1.00 mg/dL Final          Passed - CO2 in normal range and within 360 days    CO2  Date Value Ref Range Status  07/05/2020 27 22 - 32 mmol/L Final          Passed - Valid encounter within last 12 months    Recent Outpatient Visits           2 months ago Annual physical exam   [facility] Jerrol Banana., MD   7 months ago Age-related osteoporosis with current pathological fracture with routine healing, subsequent encounter   [facility] Jerrol Banana., MD   1 year ago History of tobacco abuse   [facility] Jerrol Banana., MD   1 year ago Essential hypertension   [facility] Jerrol Banana., MD   1 year ago Type 1 diabetes mellitus with hyperglycemia [facility])   [facility] Jerrol Banana., MD       Future Appointments             In 3 months Jerrol Banana., MD [facility], [facility]                Requested Prescriptions  Pending Prescriptions Disp Refills   topiramate (TOPAMAX) 50 MG tablet [Pharmacy Med Name: Topiramate 50 MG Oral Tablet] 90 tablet 0    Sig: TAKE 3 TABLETS BY MOUTH NIGHTLY      Not Delegated - Neurology:  Anticonvulsants - topiramate & zonisamide Failed - 07/22/2020  9:07 AM      Failed - This refill cannot be delegated      Failed - Cr in normal range and within 360 days    Creatinine, Ser  Date Value Ref Range Status  07/05/2020 1.25 (H) 0.44 - 1.00 mg/dL Final          Passed - CO2 in normal range and within 360 days    CO2  Date Value Ref Range Status  07/05/2020 27 22 - 32 mmol/L Final          Passed - Valid encounter within last 12 months    Recent Outpatient Visits           2 months ago Annual physical exam   [facility] Jerrol Banana., MD   7 months ago Age-related osteoporosis with current pathological fracture with routine healing, subsequent encounter   [facility] Jerrol Banana., MD   1 year ago History of tobacco abuse   [facility] Jerrol Ban[facility]., MD   1 year ago Essential  hypertension   [facility] Jerrol Banana., MD   1 year ago Type 1 diabetes mellitus with hyperglycemia [facility])   [facility] Jerrol Banana., MD       Future Appointments             In 3 months Jerrol Banana., MD [facility], [facility]

## 2020-07-27 DIAGNOSIS — E162 Hypoglycemia, unspecified: Secondary | ICD-10-CM | POA: Diagnosis not present

## 2020-07-27 DIAGNOSIS — E161 Other hypoglycemia: Secondary | ICD-10-CM | POA: Diagnosis not present

## 2020-08-03 ENCOUNTER — Other Ambulatory Visit: Payer: Self-pay | Admitting: Physician Assistant

## 2020-08-03 NOTE — Telephone Encounter (Signed)
Requested medication (s) are due for refill today: yes  Requested medication (s) are on the active medication list: yes  Last refill:  07/05/2020  Future visit scheduled: yes  Notes to clinic:  this refill cannot be delegated    Requested Prescriptions  Pending Prescriptions Disp Refills   ALPRAZolam (XANAX) 0.5 MG tablet [Pharmacy Med Name: ALPRAZolam 0.5 MG Oral Tablet] 60 tablet 0    Sig: Take 1 tablet by mouth twice daily as needed for anxiety      Not Delegated - Psychiatry:  Anxiolytics/Hypnotics Failed - 08/03/2020  5:30 AM      Failed - This refill cannot be delegated      Failed - Urine Drug Screen completed in last 360 days      Passed - Valid encounter within last 6 months    Recent Outpatient Visits           3 months ago Annual physical exam   [facility] Jerrol Banana., MD   7 months ago Age-related osteoporosis with current pathological fracture with routine healing, subsequent encounter   [facility] Jerrol Banana., MD   1 year ago History of tobacco abuse   [facility] Jerrol Banana., MD   1 year ago Essential hypertension   [facility] Jerrol Banana., MD   2 years ago Type 1 diabetes mellitus with hyperglycemia [facility])   [facility] Jerrol Banana., MD       Future Appointments             In 2 months Jerrol Banana., MD [facility], [facility]

## 2020-08-09 ENCOUNTER — Other Ambulatory Visit: Payer: Self-pay | Admitting: Family Medicine

## 2020-08-11 ENCOUNTER — Other Ambulatory Visit: Payer: Self-pay

## 2020-08-11 ENCOUNTER — Ambulatory Visit (HOSPITAL_COMMUNITY)
Admission: RE | Admit: 2020-08-11 | Discharge: 2020-08-11 | Disposition: A | Discharge status: Home / Self Care | Payer: Medicare HMO | Source: Ambulatory Visit | Attending: Sports Medicine | Admitting: Sports Medicine

## 2020-08-11 DIAGNOSIS — M810 Age-related osteoporosis without current pathological fracture: Secondary | ICD-10-CM | POA: Insufficient documentation

## 2020-08-11 MED ORDER — ROMOSOZUMAB-AQQG 105 MG/1.17ML ~~LOC~~ SOSY
210.0000 mg | PREFILLED_SYRINGE | SUBCUTANEOUS | Status: DC
  Administered 2020-08-11: 210 mg via SUBCUTANEOUS
  Filled 2020-08-11: qty 2.4

## 2020-08-17 ENCOUNTER — Other Ambulatory Visit: Payer: Self-pay | Admitting: Family Medicine

## 2020-08-17 MED ORDER — LOSARTAN POTASSIUM 25 MG PO TABS
25.0000 mg | ORAL_TABLET | Freq: Every day | ORAL | 2 refills | Status: DC
Start: 2020-08-17 — End: 2020-11-09

## 2020-08-17 NOTE — Telephone Encounter (Signed)
Pt's husband came in the office saying the pharmacy has been requesting new Rx for losartan (COZAAR) 25 MG tablet and that it was denied. I don't see a Rx request.  Please send Rx for losartan (COZAAR) 25 MG tablet to [facility]. TNP

## 2020-09-02 ENCOUNTER — Telehealth: Payer: Self-pay | Admitting: *Deleted

## 2020-09-02 NOTE — Chronic Care Management (AMB) (Signed)
  Chronic Care Management   Note  09/02/2020 Name: BRADLEIGH SONNEN MRN: [id number] DOB: Nov 19, 1945  Mollye Guinta [place] is a 74 y.o. year old female who is a primary care patient of Jerrol Banana., MD. I reached out to Judie Grieve Dudzik by phone today in response to a referral sent by Ms. Judie Grieve Duty's health plan.     Ms. Calligan was given information about Chronic Care Management services today including:  1. CCM service includes personalized support from designated clinical staff supervised by her physician, including individualized plan of care and coordination with other care providers 2. 24/7 contact phone numbers for assistance for urgent and routine care needs. 3. Service will only be billed when office clinical staff spend 20 minutes or more in a month to coordinate care. 4. Only one practitioner may furnish and bill the service in a calendar month. 5. The patient may stop CCM services at any time (effective at the end of the month) by phone call to the office staff. 6. The patient will be responsible for cost sharing (co-pay) of up to 20% of the service fee (after annual deductible is met).  Patient agreed to services and verbal consent obtained.   Follow up plan: Telephone appointment with care management team member scheduled for: 09/28/2020  [place] Management

## 2020-09-08 ENCOUNTER — Other Ambulatory Visit: Payer: Self-pay

## 2020-09-08 ENCOUNTER — Ambulatory Visit (HOSPITAL_COMMUNITY)
Admission: RE | Admit: 2020-09-08 | Discharge: 2020-09-08 | Disposition: A | Discharge status: Home / Self Care | Payer: Medicare HMO | Source: Ambulatory Visit | Attending: Sports Medicine | Admitting: Sports Medicine

## 2020-09-08 DIAGNOSIS — M810 Age-related osteoporosis without current pathological fracture: Secondary | ICD-10-CM | POA: Diagnosis not present

## 2020-09-08 MED ORDER — ROMOSOZUMAB-AQQG 105 MG/1.17ML ~~LOC~~ SOSY
210.0000 mg | PREFILLED_SYRINGE | SUBCUTANEOUS | Status: DC
  Administered 2020-09-08: 210 mg via SUBCUTANEOUS
  Filled 2020-09-08: qty 2.4

## 2020-09-17 ENCOUNTER — Other Ambulatory Visit: Payer: Self-pay | Admitting: Family Medicine

## 2020-09-22 ENCOUNTER — Other Ambulatory Visit: Payer: Self-pay | Admitting: Family Medicine

## 2020-09-27 ENCOUNTER — Other Ambulatory Visit: Payer: Self-pay | Admitting: Family Medicine

## 2020-09-27 NOTE — Telephone Encounter (Signed)
Requested medication (s) are due for refill today: yes  Requested medication (s) are on the active medication list: yes  Last refill:  08/30/2020  Future visit scheduled: yes  Notes to clinic:  this refill cannot be delegated    Requested Prescriptions  Pending Prescriptions Disp Refills   ALPRAZolam (XANAX) 0.5 MG tablet [Pharmacy Med Name: ALPRAZolam 0.5 MG Oral Tablet] 60 tablet 0    Sig: Take 1 tablet by mouth twice daily as needed for anxiety      Not Delegated - Psychiatry:  Anxiolytics/Hypnotics Failed - 09/27/2020  5:31 AM      Failed - This refill cannot be delegated      Failed - Urine Drug Screen completed in last 360 days      Passed - Valid encounter within last 6 months    Recent Outpatient Visits           5 months ago Annual physical exam   [facility] Jerrol Banana., MD   9 months ago Age-related osteoporosis with current pathological fracture with routine healing, subsequent encounter   [facility] Jerrol Banana., MD   1 year ago History of tobacco abuse   [facility] Jerrol Banana., MD   1 year ago Essential hypertension   [facility] Jerrol Banana., MD   2 years ago Type 1 diabetes mellitus with hyperglycemia [facility])   [facility] Jerrol Banana., MD       Future Appointments             In 1 month Jerrol Banana., MD [facility], [facility]

## 2020-09-28 ENCOUNTER — Telehealth: Payer: Medicare HMO

## 2020-09-28 ENCOUNTER — Telehealth: Payer: Self-pay

## 2020-09-28 NOTE — Telephone Encounter (Signed)
°  Chronic Care Management   Outreach Note  09/28/2020 Name: Debbie Dennis MRN: [id number] DOB: 11/08/1945  Primary Care Provider: Maple Hudson., MD Reason for referral : Chronic Care Management   An unsuccessful telephone outreach was attempted today. Mrs. Curb was referred to the case management team for assistance with care management and care coordination.     Follow Up Plan:  A HIPAA compliant voice message was left today requesting a return call.   [facility] Care Management [facility] [phone]

## 2020-09-29 ENCOUNTER — Other Ambulatory Visit: Payer: Self-pay | Admitting: Family Medicine

## 2020-10-06 ENCOUNTER — Other Ambulatory Visit: Payer: Self-pay

## 2020-10-06 ENCOUNTER — Ambulatory Visit (HOSPITAL_COMMUNITY)
Admission: RE | Admit: 2020-10-06 | Discharge: 2020-10-06 | Disposition: A | Discharge status: Home / Self Care | Payer: Medicare HMO | Source: Ambulatory Visit | Attending: Sports Medicine | Admitting: Sports Medicine

## 2020-10-06 DIAGNOSIS — M810 Age-related osteoporosis without current pathological fracture: Secondary | ICD-10-CM | POA: Diagnosis not present

## 2020-10-06 MED ORDER — ROMOSOZUMAB-AQQG 105 MG/1.17ML ~~LOC~~ SOSY
210.0000 mg | PREFILLED_SYRINGE | SUBCUTANEOUS | Status: DC
  Administered 2020-10-06: 210 mg via SUBCUTANEOUS
  Filled 2020-10-06: qty 2.4

## 2020-10-25 ENCOUNTER — Other Ambulatory Visit: Payer: Self-pay | Admitting: Adult Health

## 2020-10-28 ENCOUNTER — Encounter: Payer: Self-pay | Admitting: Family Medicine

## 2020-10-28 ENCOUNTER — Ambulatory Visit (INDEPENDENT_AMBULATORY_CARE_PROVIDER_SITE_OTHER): Payer: Medicare HMO | Admitting: Family Medicine

## 2020-10-28 ENCOUNTER — Other Ambulatory Visit: Payer: Self-pay

## 2020-10-28 VITALS — BP 133/77 | HR 88 | Temp 97.7°F | Resp 16 | Ht 61.0 in | Wt 125.0 lb

## 2020-10-28 DIAGNOSIS — F419 Anxiety disorder, unspecified: Secondary | ICD-10-CM

## 2020-10-28 DIAGNOSIS — N1832 Chronic kidney disease, stage 3b: Secondary | ICD-10-CM

## 2020-10-28 DIAGNOSIS — E1022 Type 1 diabetes mellitus with diabetic chronic kidney disease: Secondary | ICD-10-CM | POA: Diagnosis not present

## 2020-10-28 DIAGNOSIS — E1021 Type 1 diabetes mellitus with diabetic nephropathy: Secondary | ICD-10-CM

## 2020-10-28 DIAGNOSIS — M8000XD Age-related osteoporosis with current pathological fracture, unspecified site, subsequent encounter for fracture with routine healing: Secondary | ICD-10-CM | POA: Diagnosis not present

## 2020-10-28 DIAGNOSIS — D472 Monoclonal gammopathy: Secondary | ICD-10-CM

## 2020-10-28 DIAGNOSIS — I10 Essential (primary) hypertension: Secondary | ICD-10-CM | POA: Diagnosis not present

## 2020-10-28 DIAGNOSIS — E782 Mixed hyperlipidemia: Secondary | ICD-10-CM

## 2020-10-28 NOTE — Progress Notes (Signed)
I,[name],acting as a scribe for Wilhemena Durie, MD.,have documented all relevant documentation on the behalf of Wilhemena Durie, MD,as directed by  Wilhemena Durie, MD while in the presence of Wilhemena Durie, MD.   Established patient visit   Patient: Debbie Dennis   DOB: 1945-10-06   75 y.o. Female  MRN: [id number] Visit Date: 10/28/2020  Today's healthcare provider: Wilhemena Durie, MD   Chief Complaint  Patient presents with  . Follow-up  . Hypertension   Subjective    HPI  Patient comes in today for follow-up.  She is doing fairly well.  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  She is now followed by Dr. Elisabeth Cara from endocrinology for diabetes and Dr. Layne Benton who has her on Evenity for her osteoporosis.  She has no complaints today. Hypertension, follow-up  BP Readings from Last 3 Encounters:  10/28/20 133/77  10/06/20 (!) 114/40  09/08/20 125/71   Wt Readings from Last 3 Encounters:  10/28/20 125 lb (56.7 kg)  07/15/20 123 lb 8 oz (56 kg)  07/05/20 124 lb (56.2 kg)     She was last seen for hypertension 6 months ago.  BP at that visit was 116/69. Management since that visit includes; Controlled. She reports good compliance with treatment. She is not having side effects. none She is not exercising. She is adherent to low salt diet.   Outside blood pressures are normal.  She does not smoke.  Use of agents associated with hypertension: none.   --------------------------------------------------------------------       Medications: Outpatient Medications Prior to Visit  Medication Sig  . ALPRAZolam (XANAX) 0.5 MG tablet Take 1 tablet by mouth twice daily as needed for anxiety  . CALCIUM PO Take 600 mg by mouth. One in the morning and one at bedtime  . EQ ALLERGY RELIEF 10 MG tablet Take 1 tablet by mouth once daily  . fluticasone (FLONASE) 50 MCG/ACT nasal spray Place 2 sprays into both nostrils daily as  needed for allergies.   Marland Kitchen gabapentin (NEURONTIN) 100 MG capsule TAKE 1 CAPSULE BY MOUTH THREE TIMES DAILY  . guaiFENesin (MUCINEX) 600 MG 12 hr tablet Take 600 mg by mouth 2 (two) times daily as needed.  . insulin aspart (NOVOLOG) 100 UNIT/ML injection INJECT UP TO 60 UNITS SUBCUTANEOUSLY DAILY PER  SLIDING  SCALE (Patient taking differently: Inject 1-60 Units into the skin See admin instructions. INJECT UP TO 60 UNITS SUBCUTANEOUSLY DAILY PER  SLIDING  SCALE)  . Insulin Glargine, 2 Unit Dial, (TOUJEO MAX SOLOSTAR) 300 UNIT/ML SOPN Inject 6 Units into the skin at bedtime.   Marland Kitchen levothyroxine (EUTHYROX) 50 MCG tablet Take 1 tablet (50 mcg total) by mouth daily before breakfast.  . losartan (COZAAR) 25 MG tablet Take 1 tablet (25 mg total) by mouth daily.  . Menaquinone-7 (VITAMIN K2 PO) Take 1 capsule by mouth daily.  . Romosozumab-aqqg (EVENITY) 105 MG/1.17ML SOSY injection Inject 210 mg into the skin every 30 (thirty) days.  . rosuvastatin (CRESTOR) 5 MG tablet Take 1 tablet (5 mg total) by mouth daily.  Marland Kitchen topiramate (TOPAMAX) 50 MG tablet Take 3 tablets (150 mg total) by mouth daily. nightly  . VITAMIN D PO Take 2,500 Units by mouth at bedtime.   Marland Kitchen aspirin EC 81 MG tablet 81 mg at bedtime. Swallow whole.  (Patient not taking: Reported on 10/28/2020)  . Continuous Blood Gluc Receiver (FREESTYLE LIBRE 14 DAY READER)  DEVI Use 1 kit every 14 (fourteen) days (Patient not taking: Reported on 10/28/2020)   No facility-administered medications prior to visit.    Review of Systems  Constitutional: Negative for appetite change, chills, fatigue and fever.  Respiratory: Negative for chest tightness and shortness of breath.   Cardiovascular: Negative for chest pain and palpitations.  Gastrointestinal: Negative for abdominal pain, nausea and vomiting.  Neurological: Negative for dizziness and weakness.    Last lipids Lab Results  Component Value Date   CHOL 142 04/27/2020   HDL 62 04/27/2020   LDLCALC  67 04/27/2020   TRIG 64 04/27/2020   CHOLHDL 2.3 04/27/2020       Objective    BP 133/77 (BP Location: Right Arm, Patient Position: Sitting, Cuff Size: Normal)   Pulse 88   Temp 97.7 F (36.5 C) (Oral)   Resp 16   Ht 5' 1" (1.549 m)   Wt 125 lb (56.7 kg)   SpO2 98%   BMI 23.62 kg/m  BP Readings from Last 3 Encounters:  10/28/20 133/77  10/06/20 (!) 114/40  09/08/20 125/71   Wt Readings from Last 3 Encounters:  10/28/20 125 lb (56.7 kg)  07/15/20 123 lb 8 oz (56 kg)  07/05/20 124 lb (56.2 kg)       Physical Exam Vitals reviewed.  Constitutional:      Comments: Cachectic white female in no acute distress.  HENT:     Head: Normocephalic and atraumatic.     Right Ear: External ear normal.     Left Ear: External ear normal.     Nose: Nose normal.  Eyes:     General: No scleral icterus.    Conjunctiva/sclera: Conjunctivae normal.     Pupils: Pupils are equal, round, and reactive to light.  Neck:     Thyroid: No thyromegaly.  Cardiovascular:     Rate and Rhythm: Normal rate and regular rhythm.     Heart sounds: Normal heart sounds.  Pulmonary:     Effort: Pulmonary effort is normal.     Breath sounds: Normal breath sounds.  Abdominal:     Palpations: Abdomen is soft.  Lymphadenopathy:     Cervical: No cervical adenopathy.  Skin:    General: Skin is warm and dry.  Neurological:     General: No focal deficit present.     Mental Status: She is alert and oriented to person, place, and time.  Psychiatric:        Mood and Affect: Mood normal.        Behavior: Behavior normal.        Thought Content: Thought content normal.        Judgment: Judgment normal.       No results found for any visits on 10/28/20.  Assessment & Plan     1. Primary hypertension Good control on losartan  2. Type 1 diabetes mellitus with stage 3b chronic kidney disease [facility]) Per endocrinology, Dr. Elisabeth Cara  3. Anxiety Clinically stable  4. Monoclonal gammopathy Routine labs on  next visit  5. Age-related osteoporosis with current pathological fracture with routine healing, subsequent encounter Followed by Dr. Layne Benton  6. Mixed hyperlipidemia On Crestor.  Physical in the summer.   No follow-ups on file.      I, Wilhemena Durie, MD, have reviewed all documentation for this visit. The documentation on 11/03/20 for the exam, diagnosis, procedures, and orders are all accurate and complete.    Richard Cranford Mon, MD  [facility] ([place])  [phone] (phone) [phone] (fax)  [place]

## 2020-11-03 ENCOUNTER — Other Ambulatory Visit: Payer: Self-pay

## 2020-11-03 ENCOUNTER — Ambulatory Visit (HOSPITAL_COMMUNITY)
Admission: RE | Admit: 2020-11-03 | Discharge: 2020-11-03 | Disposition: A | Discharge status: Home / Self Care | Payer: Medicare HMO | Source: Ambulatory Visit | Attending: Sports Medicine | Admitting: Sports Medicine

## 2020-11-03 DIAGNOSIS — D472 Monoclonal gammopathy: Secondary | ICD-10-CM | POA: Diagnosis not present

## 2020-11-03 MED ORDER — ROMOSOZUMAB-AQQG 105 MG/1.17ML ~~LOC~~ SOSY
210.0000 mg | PREFILLED_SYRINGE | SUBCUTANEOUS | Status: DC
  Administered 2020-11-03: 210 mg via SUBCUTANEOUS
  Filled 2020-11-03: qty 2.4

## 2020-11-09 ENCOUNTER — Other Ambulatory Visit: Payer: Self-pay | Admitting: Family Medicine

## 2020-11-12 ENCOUNTER — Telehealth: Payer: Self-pay

## 2020-11-12 NOTE — Telephone Encounter (Signed)
  Chronic Care Management   Outreach Note  11/12/2020 Name: Debbie Dennis MRN: [id number] DOB: 03-22-1946  Primary Care Provider: Jerrol Banana., MD Reason for referral : Chronic Care Management   An unsuccessful telephone outreach was attempted today. [name] was referred to the case management team for assistance with care management and care coordination.     Follow Up Plan:  A member of the care management team will reach out to [name] again within the next two weeks.    Cristy Friedlander [facility] Care Management [facility] [phone]

## 2020-11-22 ENCOUNTER — Telehealth: Payer: Self-pay | Admitting: Family Medicine

## 2020-11-22 ENCOUNTER — Other Ambulatory Visit: Payer: Self-pay | Admitting: Adult Health

## 2020-11-22 DIAGNOSIS — M5431 Sciatica, right side: Secondary | ICD-10-CM

## 2020-11-22 DIAGNOSIS — M79604 Pain in right leg: Secondary | ICD-10-CM

## 2020-11-22 NOTE — Telephone Encounter (Signed)
Requested medication (s) are due for refill today: yes  Requested medication (s) are on the active medication list: yes  Last refill:  10/25/2020  Future visit scheduled: yes  Notes to clinic: this refill cannot be delegated    Requested Prescriptions  Pending Prescriptions Disp Refills   ALPRAZolam (XANAX) 0.5 MG tablet [Pharmacy Med Name: ALPRAZolam 0.5 MG Oral Tablet] 60 tablet 0    Sig: Take 1 tablet by mouth twice daily as needed for anxiety      Not Delegated - Psychiatry:  Anxiolytics/Hypnotics Failed - 11/22/2020  5:32 AM      Failed - This refill cannot be delegated      Failed - Urine Drug Screen completed in last 360 days      Passed - Valid encounter within last 6 months    Recent Outpatient Visits           3 weeks ago Primary hypertension   [facility] Jerrol Banana., MD   7 months ago Annual physical exam   [facility] Jerrol Banana., MD   11 months ago Age-related osteoporosis with current pathological fracture with routine healing, subsequent encounter   [facility] Jerrol Banana., MD   1 year ago History of tobacco abuse   [facility] Jerrol Banana., MD   1 year ago Essential hypertension   [facility] Jerrol Banana., MD       Future Appointments             In 5 months Jerrol Banana., MD [facility], [place]

## 2020-11-23 ENCOUNTER — Ambulatory Visit: Payer: Self-pay

## 2020-11-23 NOTE — Chronic Care Management (AMB) (Signed)
  Chronic Care Management   Outreach Note  11/23/2020 Name: Debbie Dennis MRN: [id number] DOB: 1945-11-11    Primary Care Provider: Jerrol Banana., MD Reason for referral : Chronic Care Management   [name] was referred to the case management team for assistance with care management and care coordination. She remains interested in the Chronic Care Management program. Confirmed availability to complete an assessment on 12/02/20.    Follow Up Plan:  Will complete assessment as scheduled on  12/02/20.   Debbie Dennis [facility] Care Management [facility] [phone]

## 2020-11-25 NOTE — Telephone Encounter (Signed)
Patient advised rx's were sent to pharmacy.

## 2020-11-25 NOTE — Telephone Encounter (Signed)
Pt spouse called for update on Rx refill request.

## 2020-11-30 ENCOUNTER — Other Ambulatory Visit: Payer: Self-pay | Admitting: Family Medicine

## 2020-11-30 ENCOUNTER — Other Ambulatory Visit (HOSPITAL_COMMUNITY): Payer: Self-pay | Admitting: *Deleted

## 2020-12-01 ENCOUNTER — Other Ambulatory Visit: Payer: Self-pay

## 2020-12-01 ENCOUNTER — Encounter (HOSPITAL_COMMUNITY)
Admission: RE | Admit: 2020-12-01 | Discharge: 2020-12-01 | Disposition: A | Discharge status: Home / Self Care | Payer: Medicare HMO | Source: Ambulatory Visit | Attending: Sports Medicine | Admitting: Sports Medicine

## 2020-12-01 DIAGNOSIS — M810 Age-related osteoporosis without current pathological fracture: Secondary | ICD-10-CM | POA: Diagnosis not present

## 2020-12-01 MED ORDER — ROMOSOZUMAB-AQQG 105 MG/1.17ML ~~LOC~~ SOSY
210.0000 mg | PREFILLED_SYRINGE | SUBCUTANEOUS | Status: DC
  Administered 2020-12-01: 210 mg via SUBCUTANEOUS
  Filled 2020-12-01: qty 2.34

## 2020-12-02 ENCOUNTER — Ambulatory Visit (INDEPENDENT_AMBULATORY_CARE_PROVIDER_SITE_OTHER): Payer: Medicare HMO

## 2020-12-02 DIAGNOSIS — N1832 Chronic kidney disease, stage 3b: Secondary | ICD-10-CM

## 2020-12-02 DIAGNOSIS — I10 Essential (primary) hypertension: Secondary | ICD-10-CM | POA: Diagnosis not present

## 2020-12-02 DIAGNOSIS — E1021 Type 1 diabetes mellitus with diabetic nephropathy: Secondary | ICD-10-CM

## 2020-12-02 DIAGNOSIS — E1022 Type 1 diabetes mellitus with diabetic chronic kidney disease: Secondary | ICD-10-CM

## 2020-12-02 DIAGNOSIS — E782 Mixed hyperlipidemia: Secondary | ICD-10-CM

## 2020-12-03 NOTE — Chronic Care Management (AMB) (Signed)
Chronic Care Management   Initial Visit Note   Name: Debbie Dennis MRN: [id number] DOB: 03-04-1946  Primary Care Provider: Jerrol Banana., MD Reason for referral : Chronic Care Management   Debbie Dennis is a 75 y.o. year old female who is a primary care patient of Jerrol Banana., MD. The CCM team was consulted for assistance with chronic disease management and care coordination. A telephonic assessment was conducted today.  Review of Debbie Dennis's status, including review of consultants reports, relevant labs and test results was conducted today. Collaboration with appropriate care team members was performed as part of the comprehensive evaluation and provision of chronic care management services.    SDOH (Social Determinants of Health) assessments performed: Yes See Care Plan activities for detailed interventions related to SDOH  SDOH Interventions   Flowsheet Row Most Recent Value  SDOH Interventions   Transportation Interventions Intervention Not Indicated  Alcohol Brief Interventions/Follow-up AUDIT Score <7 follow-up not indicated       Medications: Outpatient Encounter Medications as of 12/02/2020  Medication Sig Note  . CALCIUM PO Take 600 mg by mouth. One in the morning and one at bedtime   . EQ ALLERGY RELIEF 10 MG tablet Take 1 tablet by mouth once daily   . fluticasone (FLONASE) 50 MCG/ACT nasal spray Place 2 sprays into both nostrils daily as needed for allergies.    Marland Kitchen gabapentin (NEURONTIN) 100 MG capsule TAKE 1 CAPSULE BY MOUTH THREE TIMES DAILY   . guaiFENesin (MUCINEX) 600 MG 12 hr tablet Take 600 mg by mouth 2 (two) times daily as needed.   . insulin aspart (NOVOLOG) 100 UNIT/ML injection INJECT UP TO 60 UNITS SUBCUTANEOUSLY DAILY PER  SLIDING  SCALE (Patient taking differently: Inject 1-60 Units into the skin See admin instructions. INJECT UP TO 60 UNITS SUBCUTANEOUSLY DAILY PER  SLIDING  SCALE) 05/01/2020: 5units at breakfast  . Insulin Glargine, 2 Unit  Dial, (TOUJEO MAX SOLOSTAR) 300 UNIT/ML SOPN Inject 6 Units into the skin at bedtime.    Marland Kitchen levothyroxine (EUTHYROX) 50 MCG tablet Take 1 tablet (50 mcg total) by mouth daily before breakfast.   . losartan (COZAAR) 25 MG tablet Take 1 tablet by mouth once daily   . Menaquinone-7 (VITAMIN K2 PO) Take 1 capsule by mouth daily.   . Romosozumab-aqqg (EVENITY) 105 MG/1.17ML SOSY injection Inject 210 mg into the skin every 30 (thirty) days. 05/01/2020: 7 shots remaining in 365 day course, per patient  . rosuvastatin (CRESTOR) 5 MG tablet Take 1 tablet (5 mg total) by mouth daily.   Marland Kitchen topiramate (TOPAMAX) 50 MG tablet Take 3 tablets (150 mg total) by mouth daily. nightly   . VITAMIN D PO Take 2,500 Units by mouth at bedtime.    . ALPRAZolam (XANAX) 0.5 MG tablet Take 1 tablet by mouth twice daily as needed for anxiety 12/02/2020: Reports taking at night for restless leg at night as needed.  Marland Kitchen aspirin EC 81 MG tablet 81 mg at bedtime. Swallow whole.  (Patient not taking: No sig reported)   . Continuous Blood Gluc Receiver (FREESTYLE LIBRE 14 DAY READER) DEVI Use 1 kit every 14 (fourteen) days (Patient not taking: Reported on 10/28/2020)    No facility-administered encounter medications on file as of 12/02/2020.       Objective:  Patient Care Plan: Hypertension and Hyperlipidemia    Problem Identified: Hypertension and Hyperlipidemia     Long-Range Goal: Hypertension and Hyperlipidemia Monitored   Start Date: 12/02/2020  Expected End Date: 04/01/2021  Priority: High  Note:   Objective:  . Last practice recorded BP readings:  BP Readings from Last 3 Encounters:  12/01/20 (!) 120/55  11/03/20 111/61  10/28/20 133/77 .   Marland Kitchen Most recent eGFR/CrCl: No results found for: EGFR  No components found for: CRCL  Lab Results  Component Value Date   CHOL 142 04/27/2020   HDL 62 04/27/2020   LDLCALC 67 04/27/2020   TRIG 64 04/27/2020   CHOLHDL 2.3 04/27/2020       Current Barriers:  . Chronic Disease  Management support and educational needs r/t Hypertension and Hyperlipidemia.  Case Manager Clinical Goal(s):  Marland Kitchen Over the next 120 days, patient will demonstrate improved adherence to prescribed treatment plan as evidenced by taking all medications as prescribed, monitoring, and recording blood pressure and adhering to a cardiac prudent/heart healthy diet.   Current Barriers: Interventions:   Collaboration with Jerrol Banana., MD regarding development and update of comprehensive plan of care as evidenced by provider attestation and co-signature  Inter-disciplinary care team collaboration (see longitudinal plan of care)  Reviewed medications. Encouraged to continue taking as prescribed and notify provider if unable to tolerate prescribed regimen.   Provided information regarding established blood pressure parameters along with indications for notifying a provider. Reports readings have been within range. Encouraged to monitor and record readings.  Discussed compliance with recommended cardiac prudent diet. Encouraged to read nutrition labels and avoid highly processed foods when possible.  Discussed complications of cardiac disease. Reviewed s/sx of heart attack, stroke and worsening symptoms that require immediate medical attention.    Patient Goals/Self-Care Activities Over the next 120 days, patient will: -Self-administer medications as prescribed -Attend all scheduled provider appointments -Monitor and record blood pressure -Adhere to recommended cardiac prudent/heart healthy diet -Notify provider or care management team with questions and new concerns as needed   Follow Up Plan:  Will follow up next month       Patient Care Plan: Diabetes (Adult)    Problem Identified: Disease Progression (Diabetes)     Long-Range Goal: Disease Progression Prevented or Minimized   Start Date: 12/02/2020  Expected End Date: 04/01/2021  Priority: High  Note:   Objective:  Lab  Results  Component Value Date   HGBA1C 6.8 (H) 08/01/2018 .   Lab Results  Component Value Date   CREATININE 1.25 (H) 07/05/2020   CREATININE 1.94 (H) 05/11/2020   CREATININE 1.38 (H) 04/27/2020 .   Marland Kitchen No results found for: EGFR   Current Barriers:  . Chronic disease management support and educational needs r/t to Diabetes self-management   Case Manager Clinical Goal(s):  Marland Kitchen Over the next 120 days, patient will demonstrate improved adherence to prescribed treatment plan for Diabetes self-management as evidenced by daily monitoring and recording of CBG, adherence to ADA/ carb modified diet and adherence to prescribed medication regimen.  Interventions:  . Collaboration with Jerrol Banana., MD regarding development and update of comprehensive plan of care as evidenced by provider attestation and co-signature . Inter-disciplinary care team collaboration (see longitudinal plan of care) . Reviewed medications. Encouraged to continue taking medications as prescribed and notify team with concerns regarding prescription cost. . Provided information regarding importance of consistent blood glucose monitoring. Reports previously using a continuous glucose monitoring device. Discontinued use due to significant differences in readings when compared to her standard glucometer. She is currently attempting to monitor her blood glucose at least six times a day. Reports fasting readings  have ranged from 70 to 130 mg/dl. Encouraged to continue monitoring and maintain a log. . Reviewed s/sx of hypoglycemia and hyperglycemia along with recommended interventions. . Discussed nutritional intake and compliance with a diabetic diet. Reports good nutritional intake. She is increasing her intake of protein/complex carb snacks in the evening to avoid episodes of hypoglycemia during the night.  . Discussed and offered referrals for available Diabetes education classes. Declined current need for additional  resources. . Discussed importance of completing recommended DM preventive care. Reports completing recommended foot care. Reports Dr. Gabriel Carina also completes exams during clinic visits.  She recalls completing an eye exam last April. She plans to follow up with her Optometrist to schedule an exam within the next month.   Patient Goals/Self-Care Activities Over the next 120 days, patient will:  -Self-administer medications as prescribed -Attend all scheduled provider appointments -Monitor blood glucose levels consistently and utilize recommended interventions -Contact Optometrist and schedule an eye exam -Adhere to prescribed ADA/carb modified -Notify provider or care management team with questions or new concerns as needed   Follow Up Plan:  Will follow up next month     Patient Care Plan: Fall Risk (Adult)    Problem Identified: Fall Risk     Long-Range Goal: Absence of Fall and Fall-Related Injury   Start Date: 12/02/2020  Expected End Date: 04/01/2021  Priority: High  Note:   Fall Risk  12/02/2020 05/03/2020 04/26/2020 04/22/2019 07/24/2018  Falls in the past year? _0 0 Yes  Comment - Tripped - - -  Number falls in past yr: 1 0 1 - 1  Injury with Fall? _1 - Yes  Comment - Broke left foot and right foot big toe. - - -  Risk for fall due to : History of fall(s) No Fall Risks - - -  Follow up Falls prevention discussed Falls prevention discussed - - Falls evaluation completed      Current Barriers:  . High Risk for Falls d/t Impaired Gait  Clinical Goal(s):  Marland Kitchen Over the next 120 days, patient will not experience falls or require emergent care d/t fall related injuries  Interventions:  . Collaboration with Jerrol Banana., MD regarding development and update of comprehensive plan of care as evidenced by provider attestation and co-signature . Inter-disciplinary care team collaboration (see longitudinal plan of care) . Reviewed medications and discussed potential side  effects such as dizziness, lightheadedness and frequent urination. . Assessed for falls within the last year. . Provided information regarding safety and fall prevention. Encouraged to continue using her cane when ambulating. Advised to avoid prolonged activity to prevent accident falls d/t exertion. Encouraged to avoid attempting to lift weighted objects and request assistance when needed. Reports her spouse is in the home and available to assist. . Discussed ability to perform ADL's and tasks in the home. Reports performing self care without difficulty. Requires assistance with some tasks in the home d/t joint/back pain. Her spouse is available. Declines need for in-home assistance.     Self-Care Deficits/Patient Goals:  Over the next 120 days, patient will: -Utilize assistive device appropriately with all ambulation -Ensure pathways are clear and well lit -Change positions slowly and demonstrate self awareness at all times -Wear secure fitting, skid free footwear at all times when ambulating -Notify provider or care management team for questions and new concerns as needed   Follow Up Plan:  Will follow up next month        Ms. Wynelle Cleveland  was given information about Chronic Care Management services including:  1. CCM service includes personalized support from designated clinical staff supervised by her physician, including individualized plan of care and coordination with other care providers 2. 24/7 contact phone numbers for assistance for urgent and routine care needs. 3. Service will only be billed when office clinical staff spend 20 minutes or more in a month to coordinate care. 4. Only one practitioner may furnish and bill the service in a calendar month. 5. The patient may stop CCM services at any time (effective at the end of the month) by phone call to the office staff. 6. The patient will be responsible for cost sharing (co-pay) of up to 20% of the service fee (after annual  deductible is met).  Patient agreed to services and verbal consent obtained.      PLAN A member of the care management team will follow up with Mrs. Cabanilla next month.    Cristy Friedlander [facility] Care Management [facility] [phone]

## 2020-12-06 DIAGNOSIS — E162 Hypoglycemia, unspecified: Secondary | ICD-10-CM | POA: Diagnosis not present

## 2020-12-06 DIAGNOSIS — R4020 Unspecified coma: Secondary | ICD-10-CM | POA: Diagnosis not present

## 2020-12-06 DIAGNOSIS — R4182 Altered mental status, unspecified: Secondary | ICD-10-CM | POA: Diagnosis not present

## 2020-12-06 DIAGNOSIS — R0902 Hypoxemia: Secondary | ICD-10-CM | POA: Diagnosis not present

## 2020-12-06 DIAGNOSIS — E161 Other hypoglycemia: Secondary | ICD-10-CM | POA: Diagnosis not present

## 2020-12-09 ENCOUNTER — Other Ambulatory Visit: Payer: Self-pay | Admitting: Family Medicine

## 2020-12-09 DIAGNOSIS — E782 Mixed hyperlipidemia: Secondary | ICD-10-CM

## 2020-12-09 NOTE — Telephone Encounter (Signed)
Future visit in 5 months . 

## 2020-12-13 NOTE — Patient Instructions (Addendum)
Thank you for allowing the Chronic Care Management team to participate in your care.     Patient Care Plan: Hypertension and Hyperlipidemia    Problem Identified: Hypertension and Hyperlipidemia     Long-Range Goal: Hypertension and Hyperlipidemia Monitored   Start Date: 12/02/2020  Expected End Date: 04/01/2021  Priority: High  Note:   Objective:  . Last practice recorded BP readings:  BP Readings from Last 3 Encounters:  12/01/20 (!) 120/55  11/03/20 111/61  10/28/20 133/77 .   Marland Kitchen Most recent eGFR/CrCl: No results found for: EGFR  No components found for: CRCL  Lab Results  Component Value Date   CHOL 142 04/27/2020   HDL 62 04/27/2020   LDLCALC 67 04/27/2020   TRIG 64 04/27/2020   CHOLHDL 2.3 04/27/2020       Current Barriers:  . Chronic Disease Management support and educational needs r/t Hypertension and Hyperlipidemia.  Case Manager Clinical Goal(s):  Marland Kitchen Over the next 120 days, patient will demonstrate improved adherence to prescribed treatment plan as evidenced by taking all medications as prescribed, monitoring, and recording blood pressure and adhering to a cardiac prudent/heart healthy diet.   Current Barriers: Interventions:   Collaboration with Jerrol Banana., MD regarding development and update of comprehensive plan of care as evidenced by provider attestation and co-signature  Inter-disciplinary care team collaboration (see longitudinal plan of care)  Reviewed medications. Encouraged to continue taking as prescribed and notify provider if unable to tolerate prescribed regimen.   Provided information regarding established blood pressure parameters along with indications for notifying a provider. Reports readings have been within range. Encouraged to monitor and record readings.  Discussed compliance with recommended cardiac prudent diet. Encouraged to read nutrition labels and avoid highly processed foods when possible.  Discussed complications of  cardiac disease. Reviewed s/sx of heart attack, stroke and worsening symptoms that require immediate medical attention.    Patient Goals/Self-Care Activities Over the next 120 days, patient will: -Self-administer medications as prescribed -Attend all scheduled provider appointments -Monitor and record blood pressure -Adhere to recommended cardiac prudent/heart healthy diet -Notify provider or care management team with questions and new concerns as needed   Follow Up Plan:  Will follow up next month       Patient Care Plan: Diabetes (Adult)    Problem Identified: Disease Progression (Diabetes)     Long-Range Goal: Disease Progression Prevented or Minimized   Start Date: 12/02/2020  Expected End Date: 04/01/2021  Priority: High  Note:   Objective:  Lab Results  Component Value Date   HGBA1C 6.8 (H) 08/01/2018 .   Lab Results  Component Value Date   CREATININE 1.25 (H) 07/05/2020   CREATININE 1.94 (H) 05/11/2020   CREATININE 1.38 (H) 04/27/2020 .   Marland Kitchen No results found for: EGFR   Current Barriers:  . Chronic disease management support and educational needs r/t to Diabetes self-management   Case Manager Clinical Goal(s):  Marland Kitchen Over the next 120 days, patient will demonstrate improved adherence to prescribed treatment plan for Diabetes self-management as evidenced by daily monitoring and recording of CBG, adherence to ADA/ carb modified diet and adherence to prescribed medication regimen.  Interventions:  . Collaboration with Jerrol Banana., MD regarding development and update of comprehensive plan of care as evidenced by provider attestation and co-signature . Inter-disciplinary care team collaboration (see longitudinal plan of care) . Reviewed medications. Encouraged to continue taking medications as prescribed and notify team with concerns regarding prescription cost. . Provided information  regarding importance of consistent blood glucose monitoring. Reports  previously using a continuous glucose monitoring device. Discontinued use due to significant differences in readings when compared to her standard glucometer. She is currently attempting to monitor her blood glucose at least six times a day. Reports fasting readings have ranged from 70 to 130 mg/dl. Encouraged to continue monitoring and maintain a log. . Reviewed s/sx of hypoglycemia and hyperglycemia along with recommended interventions. . Discussed nutritional intake and compliance with a diabetic diet. Reports good nutritional intake. She is increasing her intake of protein/complex carb snacks in the evening to avoid episodes of hypoglycemia during the night.  . Discussed and offered referrals for available Diabetes education classes. Declined current need for additional resources. . Discussed importance of completing recommended DM preventive care. Reports completing recommended foot care. Reports Dr. Gabriel Carina also completes exams during clinic visits.  She recalls completing an eye exam last April. She plans to follow up with her Optometrist to schedule an exam within the next month.   Patient Goals/Self-Care Activities Over the next 120 days, patient will:  -Self-administer medications as prescribed -Attend all scheduled provider appointments -Monitor blood glucose levels consistently and utilize recommended interventions -Contact Optometrist and schedule an eye exam -Adhere to prescribed ADA/carb modified -Notify provider or care management team with questions or new concerns as needed   Follow Up Plan:  Will follow up next month     Patient Care Plan: Fall Risk (Adult)    Problem Identified: Fall Risk     Long-Range Goal: Absence of Fall and Fall-Related Injury   Start Date: 12/02/2020  Expected End Date: 04/01/2021  Priority: High  Note:   Fall Risk  12/02/2020 05/03/2020 04/26/2020 04/22/2019 07/24/2018  Falls in the past year? 1 1 1  0 Yes  Comment - Tripped - - -  Number falls in past  yr: 1 0 1 - 1  Injury with Fall? 1 1 1  - Yes  Comment - Broke left foot and right foot big toe. - - -  Risk for fall due to : History of fall(s) No Fall Risks - - -  Follow up Falls prevention discussed Falls prevention discussed - - Falls evaluation completed      Current Barriers:  . High Risk for Falls d/t Impaired Gait  Clinical Goal(s):  Marland Kitchen Over the next 120 days, patient will not experience falls or require emergent care d/t fall related injuries  Interventions:  . Collaboration with Jerrol Banana., MD regarding development and update of comprehensive plan of care as evidenced by provider attestation and co-signature . Inter-disciplinary care team collaboration (see longitudinal plan of care) . Reviewed medications and discussed potential side effects such as dizziness, lightheadedness and frequent urination. . Assessed for falls within the last year. . Provided information regarding safety and fall prevention. Encouraged to continue using her cane when ambulating. Advised to avoid prolonged activity to prevent accident falls d/t exertion. Encouraged to avoid attempting to lift weighted objects and request assistance when needed. Reports her spouse is in the home and available to assist. . Discussed ability to perform ADL's and tasks in the home. Reports performing self care without difficulty. Requires assistance with some tasks in the home d/t joint/back pain. Her spouse is available. Declines need for in-home assistance.     Self-Care Deficits/Patient Goals:  Over the next 120 days, patient will: -Utilize assistive device appropriately with all ambulation -Ensure pathways are clear and well lit -Change positions slowly and demonstrate self awareness  at all times -Wear secure fitting, skid free footwear at all times when ambulating -Notify provider or care management team for questions and new concerns as needed   Follow Up Plan:  Will follow up next month             Ms. Veley was given information about Chronic Care Management services including:  1. CCM service includes personalized support from designated clinical staff supervised by her physician, including individualized plan of care and coordination with other care providers 2. 24/7 contact phone numbers for assistance for urgent and routine care needs. 3. Service will only be billed when office clinical staff spend 20 minutes or more in a month to coordinate care. 4. Only one practitioner may furnish and bill the service in a calendar month. 5. The patient may stop CCM services at any time (effective at the end of the month) by phone call to the office staff. 6. The patient will be responsible for cost sharing (co-pay) of up to 20% of the service fee (after annual deductible is met).  Patient agreed to services and verbal consent obtained.      Mrs. Badolato verbalized understanding of the information discussed during the telephonic outreach today. Declined need for mailed/printed instructions. A member of the care management team will follow up with Mrs. Jarosz next month.    Cristy [facility] Care Management [facility] [phone]

## 2020-12-14 DIAGNOSIS — E109 Type 1 diabetes mellitus without complications: Secondary | ICD-10-CM | POA: Diagnosis not present

## 2020-12-14 LAB — HEMOGLOBIN A1C: Hemoglobin A1C: 5.9

## 2020-12-14 LAB — MICROALBUMIN, URINE: Microalb, Ur: <7

## 2020-12-15 DIAGNOSIS — E109 Type 1 diabetes mellitus without complications: Secondary | ICD-10-CM | POA: Diagnosis not present

## 2020-12-21 DIAGNOSIS — E1021 Type 1 diabetes mellitus with diabetic nephropathy: Secondary | ICD-10-CM | POA: Diagnosis not present

## 2020-12-21 DIAGNOSIS — E109 Type 1 diabetes mellitus without complications: Secondary | ICD-10-CM | POA: Diagnosis not present

## 2020-12-21 DIAGNOSIS — N1831 Chronic kidney disease, stage 3a: Secondary | ICD-10-CM | POA: Diagnosis not present

## 2020-12-28 DIAGNOSIS — E161 Other hypoglycemia: Secondary | ICD-10-CM | POA: Diagnosis not present

## 2020-12-28 DIAGNOSIS — E162 Hypoglycemia, unspecified: Secondary | ICD-10-CM | POA: Diagnosis not present

## 2020-12-29 ENCOUNTER — Encounter (HOSPITAL_COMMUNITY)
Admission: RE | Admit: 2020-12-29 | Discharge: 2020-12-29 | Disposition: A | Discharge status: Home / Self Care | Payer: Medicare HMO | Source: Ambulatory Visit | Attending: Sports Medicine | Admitting: Sports Medicine

## 2020-12-29 ENCOUNTER — Other Ambulatory Visit: Payer: Self-pay

## 2020-12-29 DIAGNOSIS — M810 Age-related osteoporosis without current pathological fracture: Secondary | ICD-10-CM | POA: Diagnosis not present

## 2020-12-29 MED ORDER — ROMOSOZUMAB-AQQG 105 MG/1.17ML ~~LOC~~ SOSY
210.0000 mg | PREFILLED_SYRINGE | SUBCUTANEOUS | Status: DC
  Administered 2020-12-29: 210 mg via SUBCUTANEOUS
  Filled 2020-12-29: qty 2.4

## 2021-01-04 ENCOUNTER — Other Ambulatory Visit: Payer: Self-pay | Admitting: Family Medicine

## 2021-01-04 NOTE — Telephone Encounter (Signed)
  Notes to clinic:  medication requested is not on current medication list  Review for refill    Requested Prescriptions  Pending Prescriptions Disp Refills   loratadine (CLARITIN) 10 MG tablet [Pharmacy Med Name: Loratadine 10 MG Oral Tablet] 90 tablet 0    Sig: Take 1 tablet by mouth once daily      Ear, Nose, and Throat:  Antihistamines Passed - 01/04/2021  9:20 AM      Passed - Valid encounter within last 12 months    Recent Outpatient Visits           2 months ago Primary hypertension   [facility] Jerrol Banana., MD   8 months ago Annual physical exam   [facility] Jerrol Banana., MD   1 year ago Age-related osteoporosis with current pathological fracture with routine healing, subsequent encounter   [facility] Jerrol Banana., MD   1 year ago History of tobacco abuse   [facility] Jerrol Banana., MD   2 years ago Essential hypertension   [facility] Jerrol Banana., MD       Future Appointments             In 4 months Jerrol Banana., MD [facility], [place]

## 2021-01-11 ENCOUNTER — Other Ambulatory Visit: Payer: Self-pay | Admitting: Family Medicine

## 2021-01-26 ENCOUNTER — Encounter (HOSPITAL_COMMUNITY): Payer: Medicare HMO

## 2021-01-26 ENCOUNTER — Other Ambulatory Visit: Payer: Self-pay | Admitting: Sports Medicine

## 2021-01-26 DIAGNOSIS — M810 Age-related osteoporosis without current pathological fracture: Secondary | ICD-10-CM

## 2021-01-31 DIAGNOSIS — E559 Vitamin D deficiency, unspecified: Secondary | ICD-10-CM | POA: Diagnosis not present

## 2021-01-31 DIAGNOSIS — R5383 Other fatigue: Secondary | ICD-10-CM | POA: Diagnosis not present

## 2021-01-31 DIAGNOSIS — M810 Age-related osteoporosis without current pathological fracture: Secondary | ICD-10-CM | POA: Diagnosis not present

## 2021-02-03 ENCOUNTER — Ambulatory Visit
Admission: RE | Admit: 2021-02-03 | Discharge: 2021-02-03 | Disposition: A | Discharge status: Home / Self Care | Payer: Medicare HMO | Source: Ambulatory Visit | Attending: Sports Medicine | Admitting: Sports Medicine

## 2021-02-03 ENCOUNTER — Other Ambulatory Visit: Payer: Self-pay

## 2021-02-03 DIAGNOSIS — Z780 Asymptomatic menopausal state: Secondary | ICD-10-CM | POA: Diagnosis not present

## 2021-02-03 DIAGNOSIS — M810 Age-related osteoporosis without current pathological fracture: Secondary | ICD-10-CM | POA: Diagnosis not present

## 2021-02-03 DIAGNOSIS — R4020 Unspecified coma: Secondary | ICD-10-CM | POA: Diagnosis not present

## 2021-02-03 DIAGNOSIS — M85852 Other specified disorders of bone density and structure, left thigh: Secondary | ICD-10-CM | POA: Diagnosis not present

## 2021-02-04 ENCOUNTER — Ambulatory Visit (INDEPENDENT_AMBULATORY_CARE_PROVIDER_SITE_OTHER): Payer: Medicare HMO

## 2021-02-04 DIAGNOSIS — Z9181 History of falling: Secondary | ICD-10-CM

## 2021-02-04 DIAGNOSIS — E1021 Type 1 diabetes mellitus with diabetic nephropathy: Secondary | ICD-10-CM

## 2021-02-04 DIAGNOSIS — I10 Essential (primary) hypertension: Secondary | ICD-10-CM | POA: Diagnosis not present

## 2021-02-04 DIAGNOSIS — N1832 Chronic kidney disease, stage 3b: Secondary | ICD-10-CM

## 2021-02-04 DIAGNOSIS — E1022 Type 1 diabetes mellitus with diabetic chronic kidney disease: Secondary | ICD-10-CM

## 2021-02-04 NOTE — Chronic Care Management (AMB) (Signed)
Chronic Care Management   Follow Up Note   02/04/2021 Name: Debbie Dennis MRN: [id number] DOB: January 06, 1946  Primary Care Provider: Jerrol Banana., MD Reason for referral : Chronic Care Management  Debbie Dennis is a 75 y.o. year old female who is a primary care patient of Jerrol Banana., MD. She is currently enrolled in the Chronic Care Management team. A routine telephonic outreach was conducted today.  Review of [name]'s status, including review of consultants reports, relevant labs and test results was conducted today. Collaboration with appropriate care team members was performed as part of the comprehensive evaluation and provision of chronic care management services.    SDOH (Social Determinants of Health) assessments performed: No     Outpatient Encounter Medications as of 02/04/2021  Medication Sig Note  . ALPRAZolam (XANAX) 0.5 MG tablet Take 1 tablet by mouth twice daily as needed for anxiety 12/02/2020: Reports taking at night for restless leg at night as needed.  Marland Kitchen aspirin EC 81 MG tablet 81 mg at bedtime. Swallow whole.  (Patient not taking: No sig reported)   . CALCIUM PO Take 600 mg by mouth. One in the morning and one at bedtime   . Continuous Blood Gluc Receiver (FREESTYLE LIBRE 14 DAY READER) DEVI Use 1 kit every 14 (fourteen) days (Patient not taking: Reported on 10/28/2020)   . fluticasone (FLONASE) 50 MCG/ACT nasal spray Place 2 sprays into both nostrils daily as needed for allergies.    Marland Kitchen gabapentin (NEURONTIN) 100 MG capsule TAKE 1 CAPSULE BY MOUTH THREE TIMES DAILY   . guaiFENesin (MUCINEX) 600 MG 12 hr tablet Take 600 mg by mouth 2 (two) times daily as needed.   . insulin aspart (NOVOLOG) 100 UNIT/ML injection INJECT UP TO 60 UNITS SUBCUTANEOUSLY DAILY PER  SLIDING  SCALE (Patient taking differently: Inject 1-60 Units into the skin See admin instructions. INJECT UP TO 60 UNITS SUBCUTANEOUSLY DAILY PER  SLIDING  SCALE) 05/01/2020: 5units at breakfast  .  Insulin Glargine, 2 Unit Dial, (TOUJEO MAX SOLOSTAR) 300 UNIT/ML SOPN Inject 6 Units into the skin at bedtime.    Marland Kitchen levothyroxine (EUTHYROX) 50 MCG tablet Take 1 tablet (50 mcg total) by mouth daily before breakfast.   . loratadine (CLARITIN) 10 MG tablet Take 1 tablet by mouth once daily   . losartan (COZAAR) 25 MG tablet Take 1 tablet by mouth once daily   . Menaquinone-7 (VITAMIN K2 PO) Take 1 capsule by mouth daily.   . Romosozumab-aqqg (EVENITY) 105 MG/1.17ML SOSY injection Inject 210 mg into the skin every 30 (thirty) days. 05/01/2020: 7 shots remaining in 365 day course, per patient  . rosuvastatin (CRESTOR) 5 MG tablet Take 1 tablet by mouth once daily   . topiramate (TOPAMAX) 50 MG tablet Take 3 tablets (150 mg total) by mouth daily. nightly   . VITAMIN D PO Take 2,500 Units by mouth at bedtime.     No facility-administered encounter medications on file as of 02/04/2021.     Objective:  Patient Care Plan: Hypertension and Hyperlipidemia    Problem Identified: Hypertension and Hyperlipidemia     Long-Range Goal: Hypertension and Hyperlipidemia Monitored   Start Date: 12/02/2020  Expected End Date: 04/01/2021  Priority: High  Note:   Objective:  . Last practice recorded BP readings:  BP Readings from Last 3 Encounters:  12/01/20 (!) 120/55  11/03/20 111/61  10/28/20 133/77 .   Marland Kitchen Most recent eGFR/CrCl: No results found for: EGFR  No components  found for: CRCL  Lab Results  Component Value Date   CHOL 142 04/27/2020   HDL 62 04/27/2020   LDLCALC 67 04/27/2020   TRIG 64 04/27/2020   CHOLHDL 2.3 04/27/2020     Current Barriers:  . Chronic Disease Management support and educational needs r/t Hypertension and Hyperlipidemia.  Case Manager Clinical Goal(s):  Marland Kitchen Over the next 120 days, patient will demonstrate improved adherence to prescribed treatment plan as evidenced by taking all medications as prescribed, monitoring, and recording blood pressure and adhering to a cardiac  prudent/heart healthy diet.    Current Barriers: Interventions:   Collaboration with Jerrol Banana., MD regarding development and update of comprehensive plan of care as evidenced by provider attestation and co-signature.  Inter-disciplinary care team collaboration (see longitudinal plan of care)  Reviewed medications and compliance with current treatment plan. Reports taking as prescribed. She prefers to receive a 3 month supply of losartan to assist with medication management. Reports monitoring BP as recommended. Reviewed established BP parameters and indications for notifying a provider. Reports readings have been within range. Encouraged to continue monitoring. Encouraged to record readings.  Reviewed compliance with recommended cardiac prudent diet. Attempting to follow the recommended diet.  Encouraged to continue reading nutrition labels and avoiding highly processed foods when possible.  Reviewed s/sx of heart attack, stroke and worsening symptoms that require immediate medical attention.    Patient Goals/Self-Care Activities -Self-administer medications as prescribed -Attend all scheduled provider appointments -Monitor and record blood pressure -Adhere to recommended cardiac prudent/heart healthy diet -Notify provider or care management team with questions and new concerns as needed   Follow Up Plan:  Will follow up next month       Patient Care Plan: Diabetes (Adult)    Problem Identified: Disease Progression (Diabetes)     Long-Range Goal: Disease Progression Prevented or Minimized   Start Date: 12/02/2020  Expected End Date: 04/01/2021  Priority: High  Note:    Current Barriers:  Chronic disease management support and educational needs r/t to Diabetes self-management   Case Manager Clinical Goal(s):  Marland Kitchen Over the next 120 days, patient will demonstrate improved adherence to prescribed treatment plan for Diabetes self-management as evidenced by daily  monitoring and recording of CBG, adherence to ADA/ carb modified diet and adherence to prescribed medication regimen.  Interventions:  . Collaboration with Jerrol Banana., MD regarding development and update of comprehensive plan of care as evidenced by provider attestation and co-signature . Inter-disciplinary care team collaboration (see longitudinal plan of care) . Discussed compliance with diabetes treatment plan. Reports taking medications as prescribed. Reports monitoring consistently and recording readings. Reports most readings have been within range however she does note an elevated fasting reading of 255. Recalls it was likely due to eating a late meal. Reports improvement with monitoring carbs and maintaining a diabetic diet. . Reviewed s/sx of hypoglycemia and hyperglycemia along with recommended interventions. . Discussed importance of completing recommended DM preventive care. Continues to complete recommended foot care. She was unable to attend her scheduled eye appointment as planned. Plans to contact her Optometrist to reschedule within the next month.    Patient Goals/Self-Care Activities -Self-administer medications as prescribed -Attend all scheduled provider appointments -Monitor blood glucose levels consistently and utilize recommended interventions -Adhere to prescribed ADA/carb modified -Contact Optometrist to reschedule eye exam -Notify provider or care management team with questions or new concerns as needed   Follow Up Plan:  Will follow up next month  Patient Care Plan: Fall Risk (Adult)    Problem Identified: Fall Risk     Long-Range Goal: Absence of Fall and Fall-Related Injury   Start Date: 12/02/2020  Expected End Date: 04/01/2021  Priority: High  Note:      Current Barriers:  . High Risk for Falls d/t Impaired Gait  Clinical Goal(s):  Marland Kitchen Over the next 120 days, patient will not experience falls or require emergent care d/t fall related  injuries  Interventions:  . Collaboration with Jerrol Banana., MD regarding development and update of comprehensive plan of care as evidenced by provider attestation and co-signature . Inter-disciplinary care team collaboration (see longitudinal plan of care) . Reviewed safety and fall prevention measures. Denies falls since our last outreach. Encouraged to continue using her cane or appropriate assistive device when ambulating. Advised to avoid prolonged activity to prevent accident falls d/t exertion. Encouraged to avoid attempting to lift weighted objects and request assistance when needed. She continues to perform ADL's and small tasks independently. Spouse is available to assist as needed.   Self-Care Deficits/Patient Goals: -Utilize assistive device appropriately with all ambulation -Ensure pathways are clear and well lit -Change positions slowly -Wear secure fitting, skid free footwear at all times when ambulating -Notify provider or care management team for questions and new concerns as needed   Follow Up Plan:  Will follow up next month       PLAN A member of the care management team will follow up next month.    Cristy [facility]lander [facility] Care Management [facility] [phone]

## 2021-02-07 ENCOUNTER — Other Ambulatory Visit: Payer: Self-pay | Admitting: Family Medicine

## 2021-02-07 NOTE — Telephone Encounter (Signed)
[facility] faxed refill request for the following medications:  ALPRAZolam (XANAX) 0.5 MG tablet  Last Rx: 11/25/20 with 2 refills LOV: 10/28/20 Please advise. Thanks TNP

## 2021-02-08 MED ORDER — ALPRAZOLAM 0.5 MG PO TABS: 0.5000 mg | ORAL_TABLET | Freq: Two times a day (BID) | ORAL | 2 refills | Status: DC | PRN

## 2021-02-15 NOTE — Patient Instructions (Signed)
Thank you for allowing the Chronic Care Management team to participate in your care. It was a pleasure speaking with you today. Please feel free to contact me with questions.  Goals Addressed: Patient Care Plan: Hypertension and Hyperlipidemia    Problem Identified: Hypertension and Hyperlipidemia     Long-Range Goal: Hypertension and Hyperlipidemia Monitored   Start Date: 12/02/2020  Expected End Date: 04/01/2021  Priority: High  Note:   Objective:  . Last practice recorded BP readings:  BP Readings from Last 3 Encounters:  12/01/20 (!) 120/55  11/03/20 111/61  10/28/20 133/77 .   Marland Kitchen Most recent eGFR/CrCl: No results found for: EGFR  No components found for: CRCL  Lab Results  Component Value Date   CHOL 142 04/27/2020   HDL 62 04/27/2020   LDLCALC 67 04/27/2020   TRIG 64 04/27/2020   CHOLHDL 2.3 04/27/2020     Current Barriers:  . Chronic Disease Management support and educational needs r/t Hypertension and Hyperlipidemia.  Case Manager Clinical Goal(s):  Marland Kitchen Over the next 120 days, patient will demonstrate improved adherence to prescribed treatment plan as evidenced by taking all medications as prescribed, monitoring, and recording blood pressure and adhering to a cardiac prudent/heart healthy diet.    Current Barriers: Interventions:   Collaboration with Jerrol Banana., MD regarding development and update of comprehensive plan of care as evidenced by provider attestation and co-signature.  Inter-disciplinary care team collaboration (see longitudinal plan of care)  Reviewed medications and compliance with current treatment plan. Reports taking as prescribed. She prefers to receive a 3 month supply of losartan to assist with medication management. Reports monitoring BP as recommended. Reviewed established BP parameters and indications for notifying a provider. Reports readings have been within range. Encouraged to continue monitoring. Encouraged to record  readings.  Reviewed compliance with recommended cardiac prudent diet. Attempting to follow the recommended diet.  Encouraged to continue reading nutrition labels and avoiding highly processed foods when possible.  Reviewed s/sx of heart attack, stroke and worsening symptoms that require immediate medical attention.    Patient Goals/Self-Care Activities -Self-administer medications as prescribed -Attend all scheduled provider appointments -Monitor and record blood pressure -Adhere to recommended cardiac prudent/heart healthy diet -Notify provider or care management team with questions and new concerns as needed   Follow Up Plan:  Will follow up next month       Patient Care Plan: Diabetes (Adult)    Problem Identified: Disease Progression (Diabetes)     Long-Range Goal: Disease Progression Prevented or Minimized   Start Date: 12/02/2020  Expected End Date: 04/01/2021  Priority: High  Note:    Current Barriers:  Chronic disease management support and educational needs r/t to Diabetes self-management   Case Manager Clinical Goal(s):  Marland Kitchen Over the next 120 days, patient will demonstrate improved adherence to prescribed treatment plan for Diabetes self-management as evidenced by daily monitoring and recording of CBG, adherence to ADA/ carb modified diet and adherence to prescribed medication regimen.  Interventions:  . Collaboration with Jerrol Banana., MD regarding development and update of comprehensive plan of care as evidenced by provider attestation and co-signature . Inter-disciplinary care team collaboration (see longitudinal plan of care) . Discussed compliance with diabetes treatment plan. Reports taking medications as prescribed. Reports monitoring consistently and recording readings. Reports most readings have been within range however she does note an elevated fasting reading of 255. Recalls it was likely due to eating a late meal. Reports improvement with  monitoring carbs and  maintaining a diabetic diet. . Reviewed s/sx of hypoglycemia and hyperglycemia along with recommended interventions. . Discussed importance of completing recommended DM preventive care. Continues to complete recommended foot care. She was unable to attend her scheduled eye appointment as planned. Plans to contact her Optometrist to reschedule within the next month.    Patient Goals/Self-Care Activities -Self-administer medications as prescribed -Attend all scheduled provider appointments -Monitor blood glucose levels consistently and utilize recommended interventions -Adhere to prescribed ADA/carb modified -Contact Optometrist to reschedule eye exam -Notify provider or care management team with questions or new concerns as needed   Follow Up Plan:  Will follow up next month     Patient Care Plan: Fall Risk (Adult)    Problem Identified: Fall Risk     Long-Range Goal: Absence of Fall and Fall-Related Injury   Start Date: 12/02/2020  Expected End Date: 04/01/2021  Priority: High  Note:      Current Barriers:  . High Risk for Falls d/t Impaired Gait  Clinical Goal(s):  Marland Kitchen Over the next 120 days, patient will not experience falls or require emergent care d/t fall related injuries  Interventions:  . Collaboration with Jerrol Banana., MD regarding development and update of comprehensive plan of care as evidenced by provider attestation and co-signature . Inter-disciplinary care team collaboration (see longitudinal plan of care) . Reviewed safety and fall prevention measures. Denies falls since our last outreach. Encouraged to continue using her cane or appropriate assistive device when ambulating. Advised to avoid prolonged activity to prevent accident falls d/t exertion. Encouraged to avoid attempting to lift weighted objects and request assistance when needed. She continues to perform ADL's and small tasks independently. Spouse is available to assist as  needed.   Self-Care Deficits/Patient Goals: -Utilize assistive device appropriately with all ambulation -Ensure pathways are clear and well lit -Change positions slowly -Wear secure fitting, skid free footwear at all times when ambulating -Notify provider or care management team for questions and new concerns as needed   Follow Up Plan:  Will follow up next month        Mrs. Caulder verbalized understanding of the information discussed during the telephonic outreach today. Declined need for mailed/printed instructions. A member of the care management team will follow up next month.    [facility] Care Management [facility] [phone]

## 2021-02-16 DIAGNOSIS — E559 Vitamin D deficiency, unspecified: Secondary | ICD-10-CM | POA: Diagnosis not present

## 2021-02-16 DIAGNOSIS — M810 Age-related osteoporosis without current pathological fracture: Secondary | ICD-10-CM | POA: Diagnosis not present

## 2021-02-19 DIAGNOSIS — E161 Other hypoglycemia: Secondary | ICD-10-CM | POA: Diagnosis not present

## 2021-02-19 DIAGNOSIS — R0902 Hypoxemia: Secondary | ICD-10-CM | POA: Diagnosis not present

## 2021-02-19 DIAGNOSIS — E162 Hypoglycemia, unspecified: Secondary | ICD-10-CM | POA: Diagnosis not present

## 2021-02-22 DIAGNOSIS — R5381 Other malaise: Secondary | ICD-10-CM | POA: Diagnosis not present

## 2021-02-22 DIAGNOSIS — E162 Hypoglycemia, unspecified: Secondary | ICD-10-CM | POA: Diagnosis not present

## 2021-03-08 ENCOUNTER — Other Ambulatory Visit: Payer: Self-pay | Admitting: Family Medicine

## 2021-03-08 DIAGNOSIS — E782 Mixed hyperlipidemia: Secondary | ICD-10-CM

## 2021-03-10 ENCOUNTER — Telehealth: Payer: Self-pay | Admitting: Family Medicine

## 2021-03-10 DIAGNOSIS — Z1231 Encounter for screening mammogram for malignant neoplasm of breast: Secondary | ICD-10-CM

## 2021-03-10 NOTE — Telephone Encounter (Signed)
Copied from [place] [phone]. Topic: Referral - Request for Referral >> Mar 10, 2021 10:20 AM Leward Quan A wrote: Has patient seen PCP for this complaint? Yes.   *If NO, is insurance requiring patient see PCP for this issue before PCP can refer them? Referral for which specialty: Radiology  Preferred provider/office: [facility]  Reason for referral: mammogrem

## 2021-03-23 NOTE — Telephone Encounter (Signed)
Pts husband called to follow up on referral. Please advise.

## 2021-03-24 NOTE — Telephone Encounter (Signed)
MM was ordered on 03/10/2021. Left detailed message on vm advising.

## 2021-03-30 ENCOUNTER — Ambulatory Visit (INDEPENDENT_AMBULATORY_CARE_PROVIDER_SITE_OTHER): Payer: Medicare HMO

## 2021-03-30 ENCOUNTER — Telehealth: Payer: Self-pay

## 2021-03-30 DIAGNOSIS — E1021 Type 1 diabetes mellitus with diabetic nephropathy: Secondary | ICD-10-CM

## 2021-03-30 DIAGNOSIS — N1832 Chronic kidney disease, stage 3b: Secondary | ICD-10-CM

## 2021-03-30 DIAGNOSIS — Z9181 History of falling: Secondary | ICD-10-CM

## 2021-03-30 DIAGNOSIS — E1022 Type 1 diabetes mellitus with diabetic chronic kidney disease: Secondary | ICD-10-CM

## 2021-03-30 NOTE — Telephone Encounter (Signed)
  Care Management   Follow Up Note   03/30/2021 Name: Debbie Dennis MRN: [id number] DOB: 1946/03/01   Primary Care Provider: Jerrol Banana., MD Reason for referral : Chronic Care Management   An unsuccessful outreach was attempted today. Debbie Dennis is currently enrolled in the Chronic Care Management program.   Follow Up Plan:  A HIPAA compliant voice message was left today requesting a return call.    [facility] Care Management [facility] [phone]

## 2021-04-01 ENCOUNTER — Ambulatory Visit
Admission: RE | Admit: 2021-04-01 | Discharge: 2021-04-01 | Disposition: A | Discharge status: Home / Self Care | Payer: Medicare HMO | Source: Ambulatory Visit | Attending: Family Medicine | Admitting: Family Medicine

## 2021-04-01 ENCOUNTER — Other Ambulatory Visit: Payer: Self-pay

## 2021-04-01 DIAGNOSIS — Z1231 Encounter for screening mammogram for malignant neoplasm of breast: Secondary | ICD-10-CM | POA: Diagnosis not present

## 2021-04-03 DIAGNOSIS — R0902 Hypoxemia: Secondary | ICD-10-CM | POA: Diagnosis not present

## 2021-04-03 DIAGNOSIS — E162 Hypoglycemia, unspecified: Secondary | ICD-10-CM | POA: Diagnosis not present

## 2021-04-03 DIAGNOSIS — E161 Other hypoglycemia: Secondary | ICD-10-CM | POA: Diagnosis not present

## 2021-04-08 NOTE — Chronic Care Management (AMB) (Signed)
Chronic Care Management   Follow Up Note    Name: Debbie Dennis MRN: [id number] DOB: Feb 12, 1946  Primary Care Provider: Jerrol Banana., MD Reason for referral : Chronic Care Management   Debbie Dennis is a 75 y.o. year old female who is a primary care patient of Jerrol Banana., MD. She is currently enrolled in the Chronic Care Management program. A routine telephonic outreach was conducted today.  Review of Debbie Dennis's status, including review of consultants reports, relevant labs and test results was conducted today. Collaboration with appropriate care team members was performed as part of the comprehensive evaluation and provision of chronic care management services.     SDOH (Social Determinants of Health) assessments performed: No See Care Plan activities for detailed interventions related to [facility])     Outpatient Encounter Medications as of 03/30/2021  Medication Sig Note   ALPRAZolam (XANAX) 0.5 MG tablet Take 1 tablet (0.5 mg total) by mouth 2 (two) times daily as needed. for anxiety    aspirin EC 81 MG tablet 81 mg at bedtime. Swallow whole.  (Patient not taking: No sig reported)    CALCIUM PO Take 600 mg by mouth. One in the morning and one at bedtime    Continuous Blood Gluc Receiver (FREESTYLE LIBRE 14 DAY READER) DEVI Use 1 kit every 14 (fourteen) days (Patient not taking: Reported on 10/28/2020)    fluticasone (FLONASE) 50 MCG/ACT nasal spray Place 2 sprays into both nostrils daily as needed for allergies.     gabapentin (NEURONTIN) 100 MG capsule TAKE 1 CAPSULE BY MOUTH THREE TIMES DAILY    guaiFENesin (MUCINEX) 600 MG 12 hr tablet Take 600 mg by mouth 2 (two) times daily as needed.    insulin aspart (NOVOLOG) 100 UNIT/ML injection INJECT UP TO 60 UNITS SUBCUTANEOUSLY DAILY PER  SLIDING  SCALE (Patient taking differently: Inject 1-60 Units into the skin See admin instructions. INJECT UP TO 60 UNITS SUBCUTANEOUSLY DAILY PER  SLIDING  SCALE) 05/01/2020: 5units at  breakfast   Insulin Glargine, 2 Unit Dial, (TOUJEO MAX SOLOSTAR) 300 UNIT/ML SOPN Inject 6 Units into the skin at bedtime.     levothyroxine (EUTHYROX) 50 MCG tablet Take 1 tablet (50 mcg total) by mouth daily before breakfast.    loratadine (CLARITIN) 10 MG tablet Take 1 tablet by mouth once daily    losartan (COZAAR) 25 MG tablet Take 1 tablet by mouth once daily    Menaquinone-7 (VITAMIN K2 PO) Take 1 capsule by mouth daily.    Romosozumab-aqqg (EVENITY) 105 MG/1.17ML SOSY injection Inject 210 mg into the skin every 30 (thirty) days. (Patient not taking: Reported on 03/30/2021) 03/30/2021: Reports completing. No longer taking.   rosuvastatin (CRESTOR) 5 MG tablet Take 1 tablet by mouth once daily    topiramate (TOPAMAX) 50 MG tablet Take 3 tablets (150 mg total) by mouth daily. nightly    VITAMIN D PO Take 2,500 Units by mouth at bedtime.     No facility-administered encounter medications on file as of 03/30/2021.     Objective:   Patient Care Plan: Fall Risk (Adult)     Problem Identified: Fall Risk      Long-Range Goal: Absence of Fall and Fall-Related Injury Completed 03/30/2021  Start Date: 12/02/2020  Expected End Date: 04/01/2021  Priority: High  Note:    Current Barriers:  High Risk for Falls d/t Impaired Gait  Clinical Goal(s):  Over the next 120 days, patient will not experience falls or require  emergent care d/t fall related injuries  Interventions:  Collaboration with Jerrol Banana., MD regarding development and update of comprehensive plan of care as evidenced by provider attestation and co-signature Inter-disciplinary care team collaboration (see longitudinal plan of care) Reviewed medication changes. Recently changed from monthly injections. Changed to Prolia injections every six months to reduce risks of fractures. Reports compliance with prescribed medications and recommended supplements. Reviewed safety and fall prevention measures. Denies recent falls.  Encouraged to continue using her cane or appropriate assistive device when ambulating. Advised to avoid prolonged activity to prevent accident falls d/t exertion. Encouraged to avoid attempting to lift weighted objects and request assistance when needed. Encouraged to continue using caution when changing positions and ambulating on unlevel surfaces. She continues to perform ADL's and small tasks independently. Spouse remains readily available to assist as needed.   Self-Care Deficits/Patient Goals: -Utilize assistive device appropriately with all ambulation -Ensure pathways are clear and well lit -Change positions slowly -Wear secure fitting, skid free footwear at all times when ambulating -Complete Prolia injections as scheduled -Notify provider or care management team for questions and new concerns as needed   Goal Met     PLAN A member of the care management team will follow up with Debbie Dennis in August.   [place] [phone]

## 2021-04-08 NOTE — Patient Instructions (Signed)
Thank you for allowing the Chronic Care Management team to participate in your care. It was a pleasure speaking with you today. Please feel free to contact me with questions.   Goals Addressed:  Patient Care Plan: Fall Risk (Adult)     Problem Identified: Fall Risk      Long-Range Goal: Absence of Fall and Fall-Related Injury Completed 03/30/2021  Start Date: 12/02/2020  Expected End Date: 04/01/2021  Priority: High     Current Barriers:  High Risk for Falls d/t Impaired Gait  Clinical Goal(s):  Over the next 120 days, patient will not experience falls or require emergent care d/t fall related injuries  Interventions:  Collaboration with Jerrol Banana., MD regarding development and update of comprehensive plan of care as evidenced by provider attestation and co-signature Inter-disciplinary care team collaboration (see longitudinal plan of care) Reviewed medication changes. Recently changed from monthly injections. Changed to Prolia injections every six months to reduce risks of fractures. Reports compliance with prescribed medications and recommended supplements. Reviewed safety and fall prevention measures. Denies recent falls. Encouraged to continue using her cane or appropriate assistive device when ambulating. Advised to avoid prolonged activity to prevent accident falls d/t exertion. Encouraged to avoid attempting to lift weighted objects and request assistance when needed. Encouraged to continue using caution when changing positions and ambulating on unlevel surfaces. She continues to perform ADL's and small tasks independently. Spouse remains readily available to assist as needed.   Self-Care Deficits/Patient Goals: -Utilize assistive device appropriately with all ambulation -Ensure pathways are clear and well lit -Change positions slowly -Wear secure fitting, skid free footwear at all times when ambulating -Complete Prolia injections as scheduled -Notify provider or  care management team for questions and new concerns as needed   Goal Met       Mrs. Miu verbalized understanding of the information discussed during the telephonic outreach today. Declines need for mailed/printed instructions. A member of the care management team will follow up in August.    [facility] Care Management [facility] [phone]

## 2021-04-09 ENCOUNTER — Other Ambulatory Visit: Payer: Self-pay | Admitting: Family Medicine

## 2021-04-09 NOTE — Telephone Encounter (Signed)
Requested Prescriptions  Pending Prescriptions Disp Refills  . loratadine (CLARITIN) 10 MG tablet [Pharmacy Med Name: EQ LORATADINE 10MG  TAB] 90 tablet 0    Sig: Take 1 tablet by mouth once daily     Ear, Nose, and Throat:  Antihistamines Passed - 04/09/2021  8:01 AM      Passed - Valid encounter within last 12 months    Recent Outpatient Visits          5 months ago Primary hypertension   [facility] Jerrol Banana., MD   11 months ago Annual physical exam   [facility] Jerrol Banana., MD   1 year ago Age-related osteoporosis with current pathological fracture with routine healing, subsequent encounter   [facility] Jerrol Banana., MD   1 year ago History of tobacco abuse   [facility] Jerrol Banana., MD   2 years ago Essential hypertension   [facility] Jerrol Banana., MD      Future Appointments            In 1 month Jerrol Banana., MD [facility], [place]

## 2021-05-12 ENCOUNTER — Ambulatory Visit (INDEPENDENT_AMBULATORY_CARE_PROVIDER_SITE_OTHER): Payer: Medicare HMO | Admitting: Family Medicine

## 2021-05-12 ENCOUNTER — Other Ambulatory Visit: Payer: Self-pay

## 2021-05-12 ENCOUNTER — Encounter: Payer: Self-pay | Admitting: Family Medicine

## 2021-05-12 VITALS — BP 116/70 | HR 74 | Temp 97.9°F | Resp 18 | Ht 61.0 in | Wt 127.6 lb

## 2021-05-12 DIAGNOSIS — M810 Age-related osteoporosis without current pathological fracture: Secondary | ICD-10-CM | POA: Diagnosis not present

## 2021-05-12 DIAGNOSIS — E782 Mixed hyperlipidemia: Secondary | ICD-10-CM | POA: Diagnosis not present

## 2021-05-12 DIAGNOSIS — L570 Actinic keratosis: Secondary | ICD-10-CM

## 2021-05-12 DIAGNOSIS — E1021 Type 1 diabetes mellitus with diabetic nephropathy: Secondary | ICD-10-CM | POA: Diagnosis not present

## 2021-05-12 DIAGNOSIS — E1022 Type 1 diabetes mellitus with diabetic chronic kidney disease: Secondary | ICD-10-CM

## 2021-05-12 DIAGNOSIS — E559 Vitamin D deficiency, unspecified: Secondary | ICD-10-CM | POA: Diagnosis not present

## 2021-05-12 DIAGNOSIS — Z0000 Encounter for general adult medical examination without abnormal findings: Secondary | ICD-10-CM

## 2021-05-12 DIAGNOSIS — E039 Hypothyroidism, unspecified: Secondary | ICD-10-CM

## 2021-05-12 DIAGNOSIS — R5383 Other fatigue: Secondary | ICD-10-CM

## 2021-05-12 DIAGNOSIS — I10 Essential (primary) hypertension: Secondary | ICD-10-CM | POA: Diagnosis not present

## 2021-05-12 DIAGNOSIS — N1832 Chronic kidney disease, stage 3b: Secondary | ICD-10-CM

## 2021-05-12 NOTE — Progress Notes (Signed)
I,Roshena L Chambers,acting as a scribe for Wilhemena Durie, MD.,have documented all relevant documentation on the behalf of Wilhemena Durie, MD,as directed by  Wilhemena Durie, MD while in the presence of Wilhemena Durie, MD.   Annual Wellness Visit     Patient: Debbie Dennis, Female    DOB: [birth year], 75 y.o.   MRN: [id number] Visit Date: 05/12/2021  Today's Provider: Wilhemena Durie, MD   Chief Complaint  Patient presents with   Medicare Wellness   Subjective    Debbie Dennis is a 75 y.o. female who presents today for her Annual Wellness Visit.Annual Physical. She reports consuming a general diet. The patient does not participate in regular exercise at present. She generally feels fairly well. She reports sleeping fairly well. She does not have additional problems to discuss today.   HPI       Medications: Outpatient Medications Prior to Visit  Medication Sig   ALPRAZolam (XANAX) 0.5 MG tablet Take 1 tablet (0.5 mg total) by mouth 2 (two) times daily as needed. for anxiety (Patient taking differently: Take 0.5 mg by mouth 2 (two) times daily as needed. for restless legs)   CALCIUM PO Take 600 mg by mouth. One in the morning and one at bedtime   denosumab (PROLIA) 60 MG/ML SOSY injection Inject 60 mg into the skin every 6 (six) months.   fluticasone (FLONASE) 50 MCG/ACT nasal spray Place 2 sprays into both nostrils daily as needed for allergies.    gabapentin (NEURONTIN) 100 MG capsule TAKE 1 CAPSULE BY MOUTH THREE TIMES DAILY   guaiFENesin (MUCINEX) 600 MG 12 hr tablet Take 600 mg by mouth 2 (two) times daily as needed.   insulin aspart (NOVOLOG) 100 UNIT/ML injection INJECT UP TO 60 UNITS SUBCUTANEOUSLY DAILY PER  SLIDING  SCALE (Patient taking differently: Inject 1-60 Units into the skin See admin instructions. INJECT UP TO 60 UNITS SUBCUTANEOUSLY DAILY PER  SLIDING  SCALE)   Insulin Glargine, 2 Unit Dial, (TOUJEO MAX SOLOSTAR) 300 UNIT/ML SOPN Inject 6  Units into the skin at bedtime.    levothyroxine (EUTHYROX) 50 MCG tablet Take 1 tablet (50 mcg total) by mouth daily before breakfast.   loratadine (CLARITIN) 10 MG tablet Take 1 tablet by mouth once daily   losartan (COZAAR) 25 MG tablet Take 1 tablet by mouth once daily   Menaquinone-7 (VITAMIN K2 PO) Take 1 capsule by mouth daily.   rosuvastatin (CRESTOR) 5 MG tablet Take 1 tablet by mouth once daily   topiramate (TOPAMAX) 50 MG tablet Take 3 tablets (150 mg total) by mouth daily. nightly   VITAMIN D PO Take 2,000 Units by mouth 2 (two) times daily.   [DISCONTINUED] aspirin EC 81 MG tablet 81 mg at bedtime. Swallow whole.  (Patient not taking: Reported on 05/12/2021)   [DISCONTINUED] Continuous Blood Gluc Receiver (FREESTYLE LIBRE 14 DAY READER) DEVI Use 1 kit every 14 (fourteen) days (Patient not taking: Reported on 05/12/2021)   [DISCONTINUED] Romosozumab-aqqg (EVENITY) 105 MG/1.17ML SOSY injection Inject 210 mg into the skin every 30 (thirty) days. (Patient not taking: Reported on 05/12/2021)   No facility-administered medications prior to visit.    Allergies  Allergen Reactions   Codeine Nausea And Vomiting   Sulfa Antibiotics Rash    Patient Care Team: Jerrol Banana., MD as PCP - General (Family Medicine) Leandrew Koyanagi, MD as Referring Physician (Ophthalmology) Gabriel Carina, Betsey Holiday, MD as Physician Assistant (Endocrinology) Verner Chol, MD as  Consulting Physician (Sports Medicine) Hiram Gash, MD as Consulting Physician (Orthopedic Surgery) Neldon Labella, RN as Case Manager  Review of Systems  Constitutional:  Negative for chills, fatigue and fever.  HENT:  Positive for rhinorrhea and tinnitus. Negative for congestion, ear pain, sneezing and sore throat.   Eyes: Negative.  Negative for pain and redness.  Respiratory:  Negative for cough, shortness of breath and wheezing.   Cardiovascular:  Negative for chest pain and leg swelling.  Gastrointestinal:   Negative for abdominal pain, blood in stool, constipation, diarrhea and nausea.  Endocrine: Negative for polydipsia and polyphagia.  Genitourinary: Negative.  Negative for dysuria, flank pain, hematuria, pelvic pain, vaginal bleeding and vaginal discharge.  Musculoskeletal:  Negative for arthralgias, back pain, gait problem and joint swelling.  Skin:  Negative for rash.  Neurological: Negative.  Negative for dizziness, tremors, seizures, weakness, light-headedness, numbness and headaches.  Hematological:  Negative for adenopathy.  Psychiatric/Behavioral: Negative.  Negative for behavioral problems, confusion and dysphoric mood. The patient is not nervous/anxious and is not hyperactive.   All other systems reviewed and are negative.       Objective    Vitals: BP 116/70 (BP Location: Left Arm, Patient Position: Sitting, Cuff Size: Normal)   Pulse 74   Temp 97.9 F (36.6 C) (Temporal)   Resp 18   Ht 5' 1"  (1.549 m)   Wt 127 lb 9.6 oz (57.9 kg)   BMI 24.11 kg/m  Wt Readings from Last 3 Encounters:  05/12/21 127 lb 9.6 oz (57.9 kg)  10/28/20 125 lb (56.7 kg)  07/15/20 123 lb 8 oz (56 kg)      Physical Exam Vitals reviewed.  Constitutional:      General: She is not in acute distress.    Appearance: She is well-developed.  HENT:     Head: Normocephalic and atraumatic.     Right Ear: External ear normal.     Left Ear: External ear normal.     Nose: Nose normal.  Eyes:     General: No scleral icterus.    Conjunctiva/sclera: Conjunctivae normal.     Pupils: Pupils are equal, round, and reactive to light.  Cardiovascular:     Rate and Rhythm: Normal rate and regular rhythm.     Heart sounds: Normal heart sounds. No murmur heard.   No friction rub.  Pulmonary:     Effort: Pulmonary effort is normal.     Breath sounds: Normal breath sounds. No wheezing.  Chest:     Chest wall: No tenderness.  Breasts:    Right: No inverted nipple, mass or nipple discharge.     Left: No  inverted nipple, mass or nipple discharge.  Abdominal:     General: Bowel sounds are normal. There is no distension.     Palpations: Abdomen is soft.     Tenderness: There is no abdominal tenderness.  Musculoskeletal:        General: No tenderness.     Cervical back: Normal range of motion and neck supple.  Skin:    General: Skin is warm and dry.  Neurological:     General: No focal deficit present.     Mental Status: She is alert and oriented to person, place, and time.  Psychiatric:        Mood and Affect: Mood normal.        Behavior: Behavior normal.        Thought Content: Thought content normal.  Judgment: Judgment normal.     Most recent functional status assessment: In your present state of health, do you have any difficulty performing the following activities: 05/12/2021  Hearing? Y  Vision? N  Difficulty concentrating or making decisions? Y  Walking or climbing stairs? N  Dressing or bathing? N  Doing errands, shopping? N  Some recent data might be hidden   Most recent fall risk assessment: Fall Risk  05/12/2021  Falls in the past year? 1  Comment -  Number falls in past yr: 0  Injury with Fall? 1  Comment -  Risk for fall due to : History of fall(s)  Follow up Falls prevention discussed    Most recent depression screenings: PHQ 2/9 Scores 05/12/2021 12/02/2020  PHQ - 2 Score 0 0  PHQ- 9 Score 2 -   Most recent cognitive screening: 6CIT Screen 04/17/2017  What Year? 0 points  What month? 0 points  What time? 0 points  Count back from 20 0 points  Months in reverse 0 points  Repeat phrase 4 points  Total Score 4   Most recent Audit-C alcohol use screening Alcohol Use Disorder Test (AUDIT) 05/12/2021  1. How often do you have a drink containing alcohol? 0  2. How many drinks containing alcohol do you have on a typical day when you are drinking? 0  3. How often do you have six or more drinks on one occasion? 0  AUDIT-C Score 0  Alcohol Brief  Interventions/Follow-up -   A score of 3 or more in women, and 4 or more in men indicates increased risk for alcohol abuse, EXCEPT if all of the points are from question 1   No results found for any visits on 05/12/21.  Assessment & Plan     Annual wellness visit done today including the all of the following: Reviewed patient's Family Medical History Reviewed and updated list of patient's medical providers Assessment of cognitive impairment was done Assessed patient's functional ability Established a written schedule for health screening [place] Completed and Reviewed  Exercise Activities and Dietary recommendations  Goals      DIET - EAT MORE FRUITS AND VEGETABLES     Recommend to eat 2-3 vegetables in daily diet.      DIET - INCREASE WATER INTAKE     Recommend increasing water intake to 6-8 8 oz glasses a day.      Prevent falls     Recommend to remove any items from the home that may cause slips or trips.        Immunization History  Administered Date(s) Administered   Fluad Quad(high Dose 65+) 07/15/2019, 06/02/2020   Influenza, High Dose Seasonal PF 07/22/2015, 07/24/2016, 07/19/2017, 06/12/2018   Moderna Sars-Covid-2 Vaccination 11/04/2019, 12/02/2019   Pneumococcal Conjugate-13 08/31/2014   Pneumococcal Polysaccharide-23 07/11/2007, 01/16/2013   Tdap 08/25/2008   Zoster Recombinat (Shingrix) 05/01/2019, 08/06/2019   Zoster, Live 03/02/2012    Health Maintenance  Topic Date Due   TETANUS/TDAP  08/25/2018   COVID-19 Vaccine (3 - Booster for Moderna series) 05/03/2020   HEMOGLOBIN A1C  06/13/2020   FOOT EXAM  12/11/2020   OPHTHALMOLOGY EXAM  12/11/2020   INFLUENZA VACCINE  05/02/2021   DEXA SCAN  02/04/2023   MAMMOGRAM  04/02/2023   COLONOSCOPY (Pts 45-55yr Insurance coverage will need to be confirmed)  05/13/2025   Hepatitis C Screening  Completed   PNA vac Low Risk Adult  Completed   Zoster Vaccines- Shingrix  Completed  HPV  VACCINES  Aged Out     Discussed health benefits of physical activity, and encouraged her to engage in regular exercise appropriate for her age and condition.    1. Encounter for Medicare annual wellness exam   2. Annual physical exam   3. Osteoporosis, postmenopausal  - VITAMIN D 25 Hydroxy (Vit-D Deficiency, Fractures)  4. Other fatigue  - Comprehensive Metabolic Panel (CMET) - CBC with Differential/Platelet - VITAMIN D 25 Hydroxy (Vit-D Deficiency, Fractures)  5. Vitamin D deficiency  - Comprehensive Metabolic Panel (CMET) - CBC with Differential/Platelet - VITAMIN D 25 Hydroxy (Vit-D Deficiency, Fractures)  6. Type 1 diabetes mellitus with stage 3b chronic kidney disease ([place])   7. Mixed hyperlipidemia  - Lipid panel  8. Primary hypertension   9. Adult hypothyroidism  - TSH  10. Solar keratosis  - Ambulatory referral to Dermatology   Return in about 6 months (around 11/12/2021).     I, Wilhemena Durie, MD, have reviewed all documentation for this visit. The documentation on 05/21/21 for the exam, diagnosis, procedures, and orders are all accurate and complete.    Tashika Goodin Cranford Mon, MD  [facility] [phone] (phone) [phone] (fax)  [place]

## 2021-05-13 DIAGNOSIS — M810 Age-related osteoporosis without current pathological fracture: Secondary | ICD-10-CM | POA: Diagnosis not present

## 2021-05-13 DIAGNOSIS — E559 Vitamin D deficiency, unspecified: Secondary | ICD-10-CM | POA: Diagnosis not present

## 2021-05-13 DIAGNOSIS — R5383 Other fatigue: Secondary | ICD-10-CM | POA: Diagnosis not present

## 2021-05-13 DIAGNOSIS — E039 Hypothyroidism, unspecified: Secondary | ICD-10-CM | POA: Diagnosis not present

## 2021-05-13 DIAGNOSIS — E782 Mixed hyperlipidemia: Secondary | ICD-10-CM | POA: Diagnosis not present

## 2021-05-14 LAB — LIPID PANEL
Chol/HDL Ratio: 1.9 ratio (ref 0.0–4.4)
Cholesterol, Total: 131 mg/dL (ref 100–199)
HDL: 70 mg/dL (ref >39)
LDL Chol Calc (NIH): 54 mg/dL (ref 0–99)
Triglycerides: 20 mg/dL (ref 0–149)
VLDL Cholesterol Cal: 7 mg/dL (ref 5–40)

## 2021-05-14 LAB — COMPREHENSIVE METABOLIC PANEL
ALT: 13 IU/L (ref 0–32)
AST: 23 IU/L (ref 0–40)
Albumin/Globulin Ratio: 2.2 (ref 1.2–2.2)
Albumin: 4 g/dL (ref 3.7–4.7)
Alkaline Phosphatase: 61 IU/L (ref 44–121)
BUN/Creatinine Ratio: 14 (ref 12–28)
BUN: 18 mg/dL (ref 8–27)
Bilirubin Total: 0.3 mg/dL (ref 0.0–1.2)
CO2: 21 mmol/L (ref 20–29)
Calcium: 10.3 mg/dL (ref 8.7–10.3)
Chloride: 103 mmol/L (ref 96–106)
Creatinine, Ser: 1.31 mg/dL — ABNORMAL HIGH (ref 0.57–1.00)
Globulin, Total: 1.8 g/dL (ref 1.5–4.5)
Glucose: 139 mg/dL — ABNORMAL HIGH (ref 65–99)
Potassium: 5.7 mmol/L — ABNORMAL HIGH (ref 3.5–5.2)
Sodium: 139 mmol/L (ref 134–144)
Total Protein: 5.8 g/dL — ABNORMAL LOW (ref 6.0–8.5)
eGFR: 43 mL/min/{1.73_m2} — ABNORMAL LOW (ref >59)

## 2021-05-14 LAB — CBC WITH DIFFERENTIAL/PLATELET
Basophils Absolute: 0 10*3/uL (ref 0.0–0.2)
Basos: 1 %
EOS (ABSOLUTE): 0.2 10*3/uL (ref 0.0–0.4)
Eos: 3 %
Hematocrit: 40.1 % (ref 34.0–46.6)
Hemoglobin: 13 g/dL (ref 11.1–15.9)
Immature Grans (Abs): 0 10*3/uL (ref 0.0–0.1)
Immature Granulocytes: 0 %
Lymphocytes Absolute: 2.4 10*3/uL (ref 0.7–3.1)
Lymphs: 44 %
MCH: 32.7 pg (ref 26.6–33.0)
MCHC: 32.4 g/dL (ref 31.5–35.7)
MCV: 101 fL — ABNORMAL HIGH (ref 79–97)
Monocytes Absolute: 0.6 10*3/uL (ref 0.1–0.9)
Monocytes: 11 %
Neutrophils Absolute: 2.2 10*3/uL (ref 1.4–7.0)
Neutrophils: 41 %
Platelets: 297 10*3/uL (ref 150–450)
RBC: 3.97 x10E6/uL (ref 3.77–5.28)
RDW: 11.8 % (ref 11.7–15.4)
WBC: 5.4 10*3/uL (ref 3.4–10.8)

## 2021-05-14 LAB — TSH: TSH: 3.24 u[IU]/mL (ref 0.450–4.500)

## 2021-05-14 LAB — VITAMIN D 25 HYDROXY (VIT D DEFICIENCY, FRACTURES): Vit D, 25-Hydroxy: 97.1 ng/mL (ref 30.0–100.0)

## 2021-05-20 ENCOUNTER — Ambulatory Visit (INDEPENDENT_AMBULATORY_CARE_PROVIDER_SITE_OTHER): Payer: Medicare HMO

## 2021-05-20 DIAGNOSIS — E1021 Type 1 diabetes mellitus with diabetic nephropathy: Secondary | ICD-10-CM

## 2021-05-20 DIAGNOSIS — I10 Essential (primary) hypertension: Secondary | ICD-10-CM

## 2021-05-20 DIAGNOSIS — N1832 Chronic kidney disease, stage 3b: Secondary | ICD-10-CM

## 2021-05-20 NOTE — Chronic Care Management (AMB) (Signed)
Chronic Care Management   CCM RN Visit Note  05/20/2021 Name: Debbie Dennis MRN: [id number] DOB: 05/22/1946  Subjective: Debbie Dennis is a 75 y.o. year old female who is a primary care patient of Jerrol Banana., MD. The care management team was consulted for assistance with disease management and care coordination needs.    Engaged with patient by telephone for follow up visit in response to provider referral for case management and care coordination services.   Consent to Services:  The patient was given information about Chronic Care Management services, agreed to services, and gave verbal consent prior to initiation of services.  Please see initial visit note for detailed documentation.   Assessment: Review of patient past medical history, allergies, medications, health status, including review of consultants reports, laboratory and other test data, was performed as part of comprehensive evaluation and provision of chronic care management services.   SDOH (Social Determinants of Health) assessments and interventions performed:  No  CCM Care Plan  Allergies  Allergen Reactions   Codeine Nausea And Vomiting   Sulfa Antibiotics Rash    Outpatient Encounter Medications as of 05/20/2021  Medication Sig Note   ALPRAZolam (XANAX) 0.5 MG tablet Take 1 tablet (0.5 mg total) by mouth 2 (two) times daily as needed. for anxiety (Patient taking differently: Take 0.5 mg by mouth 2 (two) times daily as needed. for restless legs)    CALCIUM PO Take 600 mg by mouth. One in the morning and one at bedtime    denosumab (PROLIA) 60 MG/ML SOSY injection Inject 60 mg into the skin every 6 (six) months.    fluticasone (FLONASE) 50 MCG/ACT nasal spray Place 2 sprays into both nostrils daily as needed for allergies.     gabapentin (NEURONTIN) 100 MG capsule TAKE 1 CAPSULE BY MOUTH THREE TIMES DAILY    guaiFENesin (MUCINEX) 600 MG 12 hr tablet Take 600 mg by mouth 2 (two) times daily as needed.     insulin aspart (NOVOLOG) 100 UNIT/ML injection INJECT UP TO 60 UNITS SUBCUTANEOUSLY DAILY PER  SLIDING  SCALE (Patient taking differently: Inject 1-60 Units into the skin See admin instructions. INJECT UP TO 60 UNITS SUBCUTANEOUSLY DAILY PER  SLIDING  SCALE) 05/01/2020: 5units at breakfast   Insulin Glargine, 2 Unit Dial, (TOUJEO MAX SOLOSTAR) 300 UNIT/ML SOPN Inject 6 Units into the skin at bedtime.     levothyroxine (EUTHYROX) 50 MCG tablet Take 1 tablet (50 mcg total) by mouth daily before breakfast.    loratadine (CLARITIN) 10 MG tablet Take 1 tablet by mouth once daily    losartan (COZAAR) 25 MG tablet Take 1 tablet by mouth once daily    Menaquinone-7 (VITAMIN K2 PO) Take 1 capsule by mouth daily.    rosuvastatin (CRESTOR) 5 MG tablet Take 1 tablet by mouth once daily    topiramate (TOPAMAX) 50 MG tablet Take 3 tablets (150 mg total) by mouth daily. nightly    VITAMIN D PO Take 2,000 Units by mouth 2 (two) times daily.    No facility-administered encounter medications on file as of 05/20/2021.    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   Insulin dependent type 1 diabetes mellitus ([place]) 02/05/2015   Bloodgood disease 02/05/2015   Acid reflux 02/05/2015   HLD (hyperlipidemia) 02/05/2015   BP (high blood pressure) 02/05/2015  Adult hypothyroidism 02/05/2015   Adaptive colitis 02/05/2015   Restless leg 02/05/2015   History of tobacco abuse 02/05/2015   Venous stasis syndrome 02/05/2015    Conditions to be addressed/monitored:HTN, HLD, and DM Patient Care Plan: Hypertension and Hyperlipidemia  Completed 05/20/2021   Problem Identified: Hypertension and Hyperlipidemia Resolved 8/19//2022     Long-Range Goal: Hypertension and Hyperlipidemia Monitored Completed 05/20/2021  Start Date: 12/02/2020  Expected End Date:  04/01/2021  Priority: High  Note:   Objective:  BP Readings from Last 3 Encounters:  05/12/21 116/70  12/29/20 130/66  12/01/20 (!) 120/55    Lab Results  Component Value Date   CHOL 131 05/13/2021   HDL 70 05/13/2021   LDLCALC 54 05/13/2021   TRIG 20 05/13/2021   CHOLHDL 1.9 05/13/2021     Current Barriers:  Chronic Disease Management support and educational needs r/t Hypertension and Hyperlipidemia.  Case Manager Clinical Goal(s):  Over the next 120 days, patient will demonstrate improved adherence to prescribed treatment plan as evidenced by taking all medications as prescribed, monitoring, and recording blood pressure and adhering to a cardiac prudent/heart healthy diet.    Current Barriers: Interventions:  Collaboration with Jerrol Banana., MD regarding development and update of comprehensive plan of care as evidenced by provider attestation and co-signature. Inter-disciplinary care team collaboration (see longitudinal plan of care) Reviewed medications and compliance with current treatment plan. Reports excellent compliance with medications. Reports monitoring BP as advised with readings within the established range. Denies episodes of chest discomfort or palpitations. Encouraged to continue monitoring and recording readings. Continues to monitor sodium intake and attempting to adhere to the recommended cardiac prudent/heart healthy diet.  Reviewed s/sx of heart attack, stroke and worsening symptoms that require immediate medical attention.    Patient Goals/Self-Care Activities Self-administer medications as prescribed Attend all scheduled provider appointments Monitor and record blood pressure Adhere to recommended cardiac prudent/heart healthy diet Notify provider or care management team with questions and new concerns as needed   Goal Met        Patient Care Plan: Diabetes (Adult)     Problem Identified: Disease Progression (Diabetes)       Long-Range Goal: Disease Progression Prevented or Minimized   Start Date: 05/20/2021  Expected End Date: 08/18/2021  Priority: High  Note:    Current Barriers:  Chronic disease management support and educational needs r/t to Diabetes self-management   Case Manager Clinical Goal(s):  Over the next 90 days, patient will demonstrate adherence to prescribed treatment plan for Diabetes self-management as evidenced by daily monitoring and recording of CBG, adherence to ADA/ carb modified diet and adherence to prescribed medication regimen.  Interventions:  Collaboration with Jerrol Banana., MD regarding development and update of comprehensive plan of care as evidenced by provider attestation and co-signature Inter-disciplinary care team collaboration (see longitudinal plan of care) Discussed compliance with diabetes treatment plan. Continues to take medications as prescribed and monitor blood glucose readings as recommended. She does recall a few low readings but reports most fasting readings have been within range.  Reviewed s/sx of hypoglycemia and hyperglycemia along with recommended interventions. Discussed importance of completing recommended DM preventive care. She will complete follow up renal function labs within the next two weeks.    Patient Goals/Self-Care Activities Self-administer medications as prescribed Attend all scheduled provider appointments Monitor blood glucose levels consistently and utilize recommended interventions Adhere to prescribed ADA/carb modified Complete follow up labs/renal function Notify provider or care management team with questions  or new concerns as needed   Follow Up Plan:  Will follow up next month        PLAN A member of the care management team will follow up next month.    Cristy [facility]lander [facility] Care Management [facility] [phone]

## 2021-05-26 ENCOUNTER — Other Ambulatory Visit: Payer: Self-pay | Admitting: Family Medicine

## 2021-05-26 DIAGNOSIS — M79604 Pain in right leg: Secondary | ICD-10-CM

## 2021-05-26 DIAGNOSIS — M5431 Sciatica, right side: Secondary | ICD-10-CM

## 2021-05-26 NOTE — Telephone Encounter (Signed)
Requested Prescriptions  Pending Prescriptions Disp Refills  . ALPRAZolam (XANAX) 0.5 MG tablet [Pharmacy Med Name: ALPRAZolam 0.5 MG Oral Tablet] 60 tablet 0    Sig: Take 1 tablet by mouth twice daily as needed for anxiety     Not Delegated - Psychiatry:  Anxiolytics/Hypnotics Failed - 05/26/2021  5:30 AM      Failed - This refill cannot be delegated      Failed - Urine Drug Screen completed in last 360 days      Passed - Valid encounter within last 6 months    Recent Outpatient Visits          2 weeks ago Encounter for [employer] annual wellness exam   [facility] Jerrol Banana., MD   7 months ago Primary hypertension   [facility] Jerrol Banana., MD   1 year ago Annual physical exam   [facility] Jerrol Banana., MD   1 year ago Age-related osteoporosis with current pathological fracture with routine healing, subsequent encounter   [facility] Jerrol Banana., MD   2 years ago History of tobacco abuse   [facility] Jerrol Banana., MD      Future Appointments            In 1 month Nehemiah Massed Monia Sabal, MD [place]   In 5 months Jerrol Banana., MD [facility]           . gabapentin (NEURONTIN) 100 MG capsule [Pharmacy Med Name: Gabapentin 100 MG Oral Capsule] 270 capsule 0    Sig: TAKE 1 CAPSULE BY MOUTH [place]     Neurology: Anticonvulsants - gabapentin Passed - 05/26/2021  5:30 AM      Passed - Valid encounter within last 12 months    Recent Outpatient Visits          2 weeks ago Encounter for Medicare annual wellness exam   [facility] Jerrol Banana., MD   7 months ago Primary hypertension   [facility] Jerrol Banana., MD   1 year ago Annual physical exam   [facility] Jerrol Banana., MD   1 year ago Age-related osteoporosis with current  pathological fracture with routine healing, subsequent encounter   [facility] Jerrol Banana., MD   2 years ago History of tobacco abuse   [facility] Jerrol Banana., MD      Future Appointments            In 1 month Ralene Bathe, MD [place]   In 5 months Jerrol Banana., MD [facility] [place]

## 2021-05-26 NOTE — Telephone Encounter (Signed)
Requested medications are due for refill today.  yes  Requested medications are on the active medications list.  yes  Last refill. 02/08/2021  Future visit scheduled.   yes  Notes to clinic.  Medication not delegated.

## 2021-05-30 ENCOUNTER — Other Ambulatory Visit: Payer: Self-pay | Admitting: Family Medicine

## 2021-05-30 DIAGNOSIS — E162 Hypoglycemia, unspecified: Secondary | ICD-10-CM | POA: Diagnosis not present

## 2021-05-30 DIAGNOSIS — E161 Other hypoglycemia: Secondary | ICD-10-CM | POA: Diagnosis not present

## 2021-05-30 NOTE — Telephone Encounter (Signed)
Please review. Thanks!  

## 2021-05-30 NOTE — Telephone Encounter (Signed)
Pts husband called in wanting the Alprazolam sent over to pharmacy, he states pt is completely out. Please advise

## 2021-05-30 NOTE — Telephone Encounter (Signed)
[facility] faxed refill request for the following medications:  ALPRAZolam (XANAX) 0.5 MG tablet   Please advise. 

## 2021-06-01 DIAGNOSIS — E1021 Type 1 diabetes mellitus with diabetic nephropathy: Secondary | ICD-10-CM

## 2021-06-01 DIAGNOSIS — I10 Essential (primary) hypertension: Secondary | ICD-10-CM

## 2021-06-01 DIAGNOSIS — N1832 Chronic kidney disease, stage 3b: Secondary | ICD-10-CM | POA: Diagnosis not present

## 2021-06-05 ENCOUNTER — Other Ambulatory Visit: Payer: Self-pay | Admitting: Family Medicine

## 2021-06-05 DIAGNOSIS — E782 Mixed hyperlipidemia: Secondary | ICD-10-CM

## 2021-06-05 NOTE — Telephone Encounter (Signed)
Requested Prescriptions  Pending Prescriptions Disp Refills  . rosuvastatin (CRESTOR) 5 MG tablet [Pharmacy Med Name: Rosuvastatin Calcium 5 MG Oral Tablet] 90 tablet 2    Sig: Take 1 tablet by mouth once daily     Cardiovascular:  Antilipid - Statins Passed - 06/05/2021  6:31 AM      Passed - Total Cholesterol in normal range and within 360 days    Cholesterol, Total  Date Value Ref Range Status  05/13/2021 131 100 - 199 mg/dL Final         Passed - LDL in normal range and within 360 days    LDL Chol Calc (NIH)  Date Value Ref Range Status  05/13/2021 54 0 - 99 mg/dL Final         Passed - HDL in normal range and within 360 days    HDL  Date Value Ref Range Status  05/13/2021 70 >39 mg/dL Final         Passed - Triglycerides in normal range and within 360 days    Triglycerides  Date Value Ref Range Status  05/13/2021 20 0 - 149 mg/dL Final         Passed - Patient is not pregnant      Passed - Valid encounter within last 12 months    Recent Outpatient Visits          3 weeks ago Encounter for [employer] annual wellness exam   [facility] Jerrol Banana., MD   7 months ago Primary hypertension   [facility] Jerrol Banana., MD   1 year ago Annual physical exam   [facility] Jerrol Banana., MD   1 year ago Age-related osteoporosis with current pathological fracture with routine healing, subsequent encounter   [facility] Jerrol Banana., MD   2 years ago History of tobacco abuse   [facility] Jerrol Banana., MD      Future Appointments            In 1 month Ralene Bathe, MD [place]   In 5 months Jerrol Banana., MD [facility] [place]

## 2021-06-14 ENCOUNTER — Other Ambulatory Visit: Payer: Self-pay

## 2021-06-14 DIAGNOSIS — I10 Essential (primary) hypertension: Secondary | ICD-10-CM | POA: Diagnosis not present

## 2021-06-15 ENCOUNTER — Ambulatory Visit: Payer: Self-pay

## 2021-06-15 DIAGNOSIS — E1022 Type 1 diabetes mellitus with diabetic chronic kidney disease: Secondary | ICD-10-CM

## 2021-06-15 DIAGNOSIS — E1021 Type 1 diabetes mellitus with diabetic nephropathy: Secondary | ICD-10-CM

## 2021-06-15 LAB — RENAL FUNCTION PANEL
Albumin: 4.3 g/dL (ref 3.7–4.7)
BUN/Creatinine Ratio: 11 — ABNORMAL LOW (ref 12–28)
BUN: 14 mg/dL (ref 8–27)
CO2: 22 mmol/L (ref 20–29)
Calcium: 9.9 mg/dL (ref 8.7–10.3)
Chloride: 94 mmol/L — ABNORMAL LOW (ref 96–106)
Creatinine, Ser: 1.24 mg/dL — ABNORMAL HIGH (ref 0.57–1.00)
Glucose: 117 mg/dL — ABNORMAL HIGH (ref 65–99)
Phosphorus: 3.6 mg/dL (ref 3.0–4.3)
Potassium: 5.7 mmol/L — ABNORMAL HIGH (ref 3.5–5.2)
Sodium: 130 mmol/L — ABNORMAL LOW (ref 134–144)
eGFR: 46 mL/min/{1.73_m2} — ABNORMAL LOW (ref >59)

## 2021-06-15 NOTE — Chronic Care Management (AMB) (Signed)
  Chronic Care Management   CCM RN Note  06/15/2021 Name: Debbie Dennis MRN: [id number] DOB: August 07, 1946  Subjective: Debbie Dennis is a 75 y.o. year old female who is a primary care patient of Jerrol Banana., MD. The care management team was consulted for assistance with disease management and care coordination needs.  Called today to reschedule a pending outreach. Appointment rescheduled for 07/22/21. Agreed to call with urgent concerns if needed prior to the outreach.    PLAN Will follow up as requested on 07/22/21.    [facility]risty [facility] Care Management [facility] [phone]

## 2021-06-17 DIAGNOSIS — E109 Type 1 diabetes mellitus without complications: Secondary | ICD-10-CM | POA: Diagnosis not present

## 2021-06-17 LAB — HEMOGLOBIN A1C: Hemoglobin A1C: 6.1

## 2021-06-19 DIAGNOSIS — R4182 Altered mental status, unspecified: Secondary | ICD-10-CM | POA: Diagnosis not present

## 2021-06-19 DIAGNOSIS — E161 Other hypoglycemia: Secondary | ICD-10-CM | POA: Diagnosis not present

## 2021-06-19 DIAGNOSIS — R404 Transient alteration of awareness: Secondary | ICD-10-CM | POA: Diagnosis not present

## 2021-06-19 DIAGNOSIS — E162 Hypoglycemia, unspecified: Secondary | ICD-10-CM | POA: Diagnosis not present

## 2021-06-20 DIAGNOSIS — E103392 Type 1 diabetes mellitus with moderate nonproliferative diabetic retinopathy without macular edema, left eye: Secondary | ICD-10-CM | POA: Diagnosis not present

## 2021-06-20 LAB — HM DIABETES EYE EXAM

## 2021-06-23 ENCOUNTER — Encounter: Payer: Self-pay | Admitting: *Deleted

## 2021-06-24 DIAGNOSIS — E109 Type 1 diabetes mellitus without complications: Secondary | ICD-10-CM | POA: Diagnosis not present

## 2021-06-24 DIAGNOSIS — N1832 Chronic kidney disease, stage 3b: Secondary | ICD-10-CM | POA: Diagnosis not present

## 2021-06-24 DIAGNOSIS — E1022 Type 1 diabetes mellitus with diabetic chronic kidney disease: Secondary | ICD-10-CM | POA: Diagnosis not present

## 2021-06-27 NOTE — Patient Instructions (Addendum)
Thank you for allowing the Chronic Care Management team to participate in your care.   Goals Addressed: Patient Care Plan: Hypertension and Hyperlipidemia  Completed 05/20/2021   Problem Identified: Hypertension and Hyperlipidemia Resolved 8/19//2022     Long-Range Goal: Hypertension and Hyperlipidemia Monitored Completed 05/20/2021  Start Date: 12/02/2020  Expected End Date: 04/01/2021  Priority: High  Note:   Objective:  BP Readings from Last 3 Encounters:  05/12/21 116/70  12/29/20 130/66  12/01/20 (!) 120/55    Lab Results  Component Value Date   CHOL 131 05/13/2021   HDL 70 05/13/2021   LDLCALC 54 05/13/2021   TRIG 20 05/13/2021   CHOLHDL 1.9 05/13/2021     Current Barriers:  Chronic Disease Management support and educational needs r/t Hypertension and Hyperlipidemia.  Case Manager Clinical Goal(s):  Over the next 120 days, patient will demonstrate improved adherence to prescribed treatment plan as evidenced by taking all medications as prescribed, monitoring, and recording blood pressure and adhering to a cardiac prudent/heart healthy diet.    Current Barriers: Interventions:  Collaboration with Jerrol Banana., MD regarding development and update of comprehensive plan of care as evidenced by provider attestation and co-signature. Inter-disciplinary care team collaboration (see longitudinal plan of care) Reviewed medications and compliance with current treatment plan. Reports excellent compliance with medications. Reports monitoring BP as advised with readings within the established range. Denies episodes of chest discomfort or palpitations. Encouraged to continue monitoring and recording readings. Continues to monitor sodium intake and attempting to adhere to the recommended cardiac prudent/heart healthy diet.  Reviewed s/sx of heart attack, stroke and worsening symptoms that require immediate medical attention.    Patient Goals/Self-Care  Activities Self-administer medications as prescribed Attend all scheduled provider appointments Monitor and record blood pressure Adhere to recommended cardiac prudent/heart healthy diet Notify provider or care management team with questions and new concerns as needed   Goal Met        Patient Care Plan: Diabetes (Adult)     Problem Identified: Disease Progression (Diabetes)      Long-Range Goal: Disease Progression Prevented or Minimized   Start Date: 05/20/2021  Expected End Date: 08/18/2021  Priority: High  Note:    Current Barriers:  Chronic disease management support and educational needs r/t to Diabetes self-management   Case Manager Clinical Goal(s):  Over the next 90 days, patient will demonstrate adherence to prescribed treatment plan for Diabetes self-management as evidenced by daily monitoring and recording of CBG, adherence to ADA/ carb modified diet and adherence to prescribed medication regimen.  Interventions:  Collaboration with Jerrol Banana., MD regarding development and update of comprehensive plan of care as evidenced by provider attestation and co-signature Inter-disciplinary care team collaboration (see longitudinal plan of care) Discussed compliance with diabetes treatment plan. Continues to take medications as prescribed and monitor blood glucose readings as recommended. She does recall a few low readings but reports most fasting readings have been within range.  Reviewed s/sx of hypoglycemia and hyperglycemia along with recommended interventions. Discussed importance of completing recommended DM preventive care. She will complete follow up renal function labs within the next two weeks.    Patient Goals/Self-Care Activities Self-administer medications as prescribed Attend all scheduled provider appointments Monitor blood glucose levels consistently and utilize recommended interventions Adhere to prescribed ADA/carb modified Complete  follow up labs/renal function Notify provider or care management team with questions or new concerns as needed   Follow Up Plan:  Will follow up next month  Mrs. Cavins verbalized understanding of the information discussed during the telephonic outreach. Declined need for mailed/printed instructions. A member of the care management team will follow up next month.    Cristy [facility] [phone]

## 2021-06-28 ENCOUNTER — Inpatient Hospital Stay: Payer: Medicare HMO | Attending: Nurse Practitioner

## 2021-06-28 DIAGNOSIS — D472 Monoclonal gammopathy: Secondary | ICD-10-CM | POA: Diagnosis not present

## 2021-06-28 LAB — CBC WITH DIFFERENTIAL/PLATELET
Abs Immature Granulocytes: 0.01 10*3/uL (ref 0.00–0.07)
Basophils Absolute: 0 10*3/uL (ref 0.0–0.1)
Basophils Relative: 1 %
Eosinophils Absolute: 0.1 10*3/uL (ref 0.0–0.5)
Eosinophils Relative: 2 %
HCT: 37.3 % (ref 36.0–46.0)
Hemoglobin: 12.4 g/dL (ref 12.0–15.0)
Immature Granulocytes: 0 %
Lymphocytes Relative: 34 %
Lymphs Abs: 1.7 10*3/uL (ref 0.7–4.0)
MCH: 33.4 pg (ref 26.0–34.0)
MCHC: 33.2 g/dL (ref 30.0–36.0)
MCV: 100.5 fL — ABNORMAL HIGH (ref 80.0–100.0)
Monocytes Absolute: 0.4 10*3/uL (ref 0.1–1.0)
Monocytes Relative: 9 %
Neutro Abs: 2.7 10*3/uL (ref 1.7–7.7)
Neutrophils Relative %: 54 %
Platelets: 334 10*3/uL (ref 150–400)
RBC: 3.71 MIL/uL — ABNORMAL LOW (ref 3.87–5.11)
RDW: 12.4 % (ref 11.5–15.5)
WBC: 4.9 10*3/uL (ref 4.0–10.5)
nRBC: 0 % (ref 0.0–0.2)

## 2021-06-28 LAB — COMPREHENSIVE METABOLIC PANEL
ALT: 17 U/L (ref 0–44)
AST: 31 U/L (ref 15–41)
Albumin: 4.1 g/dL (ref 3.5–5.0)
Alkaline Phosphatase: 66 U/L (ref 38–126)
Anion gap: 8 (ref 5–15)
BUN: 17 mg/dL (ref 8–23)
CO2: 27 mmol/L (ref 22–32)
Calcium: 9 mg/dL (ref 8.9–10.3)
Chloride: 95 mmol/L — ABNORMAL LOW (ref 98–111)
Creatinine, Ser: 1.08 mg/dL — ABNORMAL HIGH (ref 0.44–1.00)
GFR, Estimated: 54 mL/min — ABNORMAL LOW (ref >60)
Glucose, Bld: 73 mg/dL (ref 70–99)
Potassium: 3.7 mmol/L (ref 3.5–5.1)
Sodium: 130 mmol/L — ABNORMAL LOW (ref 135–145)
Total Bilirubin: 0.7 mg/dL (ref 0.3–1.2)
Total Protein: 6.5 g/dL (ref 6.5–8.1)

## 2021-06-29 LAB — KAPPA/LAMBDA LIGHT CHAINS
Kappa free light chain: 32.9 mg/L — ABNORMAL HIGH (ref 3.3–19.4)
Kappa, lambda light chain ratio: 1.92 — ABNORMAL HIGH (ref 0.26–1.65)
Lambda free light chains: 17.1 mg/L (ref 5.7–26.3)

## 2021-07-01 ENCOUNTER — Telehealth: Payer: Medicare HMO

## 2021-07-02 LAB — MULTIPLE MYELOMA PANEL, SERUM
Albumin SerPl Elph-Mcnc: 3.8 g/dL (ref 2.9–4.4)
Albumin/Glob SerPl: 1.7 (ref 0.7–1.7)
Alpha 1: 0.2 g/dL (ref 0.0–0.4)
Alpha2 Glob SerPl Elph-Mcnc: 0.6 g/dL (ref 0.4–1.0)
B-Globulin SerPl Elph-Mcnc: 0.8 g/dL (ref 0.7–1.3)
Gamma Glob SerPl Elph-Mcnc: 0.7 g/dL (ref 0.4–1.8)
Globulin, Total: 2.3 g/dL (ref 2.2–3.9)
IgA: 62 mg/dL — ABNORMAL LOW (ref 64–422)
IgG (Immunoglobin G), Serum: 699 mg/dL (ref 586–1602)
IgM (Immunoglobulin M), Srm: 135 mg/dL (ref 26–217)
Total Protein ELP: 6.1 g/dL (ref 6.0–8.5)

## 2021-07-05 ENCOUNTER — Inpatient Hospital Stay: Payer: Medicare HMO | Attending: Internal Medicine | Admitting: Nurse Practitioner

## 2021-07-05 ENCOUNTER — Other Ambulatory Visit: Payer: Self-pay

## 2021-07-05 VITALS — BP 140/78 | HR 76 | Temp 97.7°F | Resp 16 | Wt 128.8 lb

## 2021-07-05 DIAGNOSIS — E039 Hypothyroidism, unspecified: Secondary | ICD-10-CM | POA: Diagnosis not present

## 2021-07-05 DIAGNOSIS — Z794 Long term (current) use of insulin: Secondary | ICD-10-CM | POA: Insufficient documentation

## 2021-07-05 DIAGNOSIS — Z79899 Other long term (current) drug therapy: Secondary | ICD-10-CM | POA: Insufficient documentation

## 2021-07-05 DIAGNOSIS — D801 Nonfamilial hypogammaglobulinemia: Secondary | ICD-10-CM | POA: Insufficient documentation

## 2021-07-05 DIAGNOSIS — M810 Age-related osteoporosis without current pathological fracture: Secondary | ICD-10-CM | POA: Insufficient documentation

## 2021-07-05 DIAGNOSIS — K219 Gastro-esophageal reflux disease without esophagitis: Secondary | ICD-10-CM | POA: Diagnosis not present

## 2021-07-05 DIAGNOSIS — Z803 Family history of malignant neoplasm of breast: Secondary | ICD-10-CM | POA: Diagnosis not present

## 2021-07-05 DIAGNOSIS — E119 Type 2 diabetes mellitus without complications: Secondary | ICD-10-CM | POA: Diagnosis not present

## 2021-07-05 DIAGNOSIS — Z8052 Family history of malignant neoplasm of bladder: Secondary | ICD-10-CM | POA: Insufficient documentation

## 2021-07-05 DIAGNOSIS — N183 Chronic kidney disease, stage 3 unspecified: Secondary | ICD-10-CM | POA: Insufficient documentation

## 2021-07-05 DIAGNOSIS — G2581 Restless legs syndrome: Secondary | ICD-10-CM | POA: Diagnosis not present

## 2021-07-05 DIAGNOSIS — M2550 Pain in unspecified joint: Secondary | ICD-10-CM | POA: Insufficient documentation

## 2021-07-05 DIAGNOSIS — D472 Monoclonal gammopathy: Secondary | ICD-10-CM | POA: Diagnosis not present

## 2021-07-05 DIAGNOSIS — I129 Hypertensive chronic kidney disease with stage 1 through stage 4 chronic kidney disease, or unspecified chronic kidney disease: Secondary | ICD-10-CM | POA: Insufficient documentation

## 2021-07-05 DIAGNOSIS — Z87891 Personal history of nicotine dependence: Secondary | ICD-10-CM | POA: Diagnosis not present

## 2021-07-05 DIAGNOSIS — Z801 Family history of malignant neoplasm of trachea, bronchus and lung: Secondary | ICD-10-CM | POA: Diagnosis not present

## 2021-07-05 NOTE — Progress Notes (Signed)
Pt had fracture from fall started last year and feels like she is back to normal again . She did not have symptoms of monoclonal gammopathy.

## 2021-07-05 NOTE — Progress Notes (Signed)
[place] PROGRESS NOTE  Patient Care Team: Jerrol Banana., MD as PCP - General (Family Medicine) Leandrew Koyanagi, MD as Referring Physician (Ophthalmology) Gabriel Carina, Betsey Holiday, MD as Physician Assistant (Endocrinology) Verner Chol, MD as Consulting Physician (Sports Medicine) Hiram Gash, MD as Consulting Physician (Orthopedic Surgery) Neldon Labella, RN as Case Manager  CHIEF COMPLAINTS/PURPOSE OF CONSULTATION: Monoclonal gammopathy  #Hypogammaglobinemia [sep 2021]-incidental; hemoglobin 11.4 [normal]; calcium 10.0; platelets normal white count normal.   #Severe osteoporosis; diabetes type 2 on insulin; history of smoking Oncology History   No history exists.     HISTORY OF PRESENTING ILLNESS:  Debbie Dennis 75 y.o.  female with monoclonal gammopathy who returns to clinic for 1 year follow-up.  She had labs last week and returns for further evaluation.  Continues to feel at baseline.  No numbness or tingling.  Chronic joint pains are stable and unchanged. She continues to feel well and denies specific complaints.    Review of Systems  Constitutional:  Negative for chills, diaphoresis, fever, malaise/fatigue and weight loss.  HENT:  Negative for nosebleeds and sore throat.   Eyes:  Negative for double vision.  Respiratory:  Negative for cough, hemoptysis, sputum production, shortness of breath and wheezing.   Cardiovascular:  Negative for chest pain, palpitations, orthopnea and leg swelling.  Gastrointestinal:  Negative for abdominal pain, blood in stool, constipation, diarrhea, heartburn, melena, nausea and vomiting.  Genitourinary:  Negative for dysuria, frequency and urgency.  Musculoskeletal:  Negative for back pain, falls, joint pain and myalgias.  Skin: Negative.  Negative for itching and rash.  Neurological:  Negative for dizziness, tingling, focal weakness, weakness and headaches.  Endo/Heme/Allergies:  Does not bruise/bleed easily.   Psychiatric/Behavioral:  Negative for depression. The patient is not nervous/anxious and does not have insomnia.     MEDICAL HISTORY:  Past Medical History:  Diagnosis Date   Adaptive colitis    Anxiety    Arthritis    Bronchitis    CHRONIC   Buedinger-Ludloff-Laewen disease    Complication of anesthesia    hard to put to sleep-needs more meds   Diabetes mellitus without complication (HCC)    IDDM   GERD (gastroesophageal reflux disease)    Headache    MIGRAINES   Hypertension    Hypothyroidism    Restless leg syndrome     SURGICAL HISTORY: Past Surgical History:  Procedure Laterality Date   ABDOMINAL HYSTERECTOMY     has ovaries-due to abnormal pap smears   BREAST EXCISIONAL BIOPSY Left 1988   BREAST SURGERY     lumpectomy neg 1980's   CATARACT EXTRACTION W/PHACO Left 01/20/2016   Procedure: CATARACT EXTRACTION PHACO AND INTRAOCULAR LENS PLACEMENT ([place]);  Surgeon: Leandrew Koyanagi, MD;  Location: [facility] ORS;  Service: Ophthalmology;  Laterality: Left;  Lot # [id number] H US:02:01:5 AP:20.2% CDE: 24.51   CATARACT EXTRACTION W/PHACO Right 11/28/2016   Procedure: CATARACT EXTRACTION PHACO AND INTRAOCULAR LENS PLACEMENT ([facility]);  Surgeon: Leandrew Koyanagi, MD;  Location: [facility] OR[facility];  Service: Ophthalmology;  Laterality: Right;  [place] 01:01 AP% 18.3 CDE 11.26 Fluid pack lot # [id number]   COLONOSCOPY WITH PROPOFOL N/A 05/14/2015   Procedure: COLONOSCOPY WITH PROPOFOL;  Surgeon: Josefine Class, MD;  Location[facility] END[facility];  Service: Endoscopy;  Laterality: N/A;   EYE SURGERY     cataract left   ORIF ANKLE FRACTURE Right 10/24/2019   Procedure: OPEN REDUCTION INTERNAL FIXATION (ORIF) TRIMALLEOLAR ANKLE FRACTURE;  Surgeon: Hiram Gash, MD;  Location: [facility];  Service: Orthopedics;  Laterality: Right;    SOCIAL HISTORY: Social History   Socioeconomic History   Marital status: Married    Spouse name: Sonia Side   Number of children: 1   Years of  education: 12   Highest education level: 12th grade  Occupational History   Occupation: retired  Tobacco Use   Smoking status: Former    Packs/day: 1.00    Years: 35.00    Pack years: 35.00    Types: Cigarettes    Quit date: 10/02/2012    Years since quitting: 8.7   Smokeless tobacco: Never  Vaping Use   Vaping Use: Never used  Substance and Sexual Activity   Alcohol use: No   Drug use: No   Sexual activity: Not Currently    Birth control/protection: None, Surgical  Other Topics Concern   Not on file  Social History Narrative   Quit smoking 2014; no alcohol. Worked in [place]; lives in [place].    Social Determinants of Health   Financial Resource Strain: Not on file  Food Insecurity: Not on file  Transportation Needs: No Transportation Needs   Lack of Transportation (Medical): No   Lack of Transportation (Non-Medical): No  Physical Activity: Not on file  Stress: Not on file  Social Connections: Not on file  Intimate Partner Violence: Not on file    FAMILY HISTORY: Family History  Problem Relation Age of Onset   Cancer Mother        kidney cancer- spread into her lungs-died from heart issues    Heart attack Mother    COPD Father    Throat cancer Father    Heart disease Father    Arthritis Sister    Breast cancer Sister 5   Lung cancer Brother    Asthma Maternal Grandfather     ALLERGIES:  is allergic to codeine and sulfa antibiotics.  MEDICATIONS:  Current Outpatient Medications  Medication Sig Dispense Refill   ALPRAZolam (XANAX) 0.5 MG tablet Take 1 tablet by mouth twice daily as needed for anxiety 60 tablet 3   CALCIUM PO Take 600 mg by mouth. One in the morning and one at bedtime     denosumab (PROLIA) 60 MG/ML SOSY injection Inject 60 mg into the skin every 6 (six) months.     fluticasone (FLONASE) 50 MCG/ACT nasal spray Place 2 sprays into both nostrils daily as needed for allergies.      gabapentin (NEURONTIN) 100 MG capsule TAKE 1 CAPSULE BY  MOUTH THREE TIMES DAILY 270 capsule 0   guaiFENesin (MUCINEX) 600 MG 12 hr tablet Take 600 mg by mouth 2 (two) times daily as needed.     insulin aspart (NOVOLOG) 100 UNIT/ML injection INJECT UP TO 60 UNITS SUBCUTANEOUSLY DAILY PER  SLIDING  SCALE (Patient taking differently: Inject 1-60 Units into the skin See admin instructions. INJECT UP TO 60 UNITS SUBCUTANEOUSLY DAILY PER  SLIDING  SCALE) 90 mL 3   Insulin Glargine, 2 Unit Dial, (TOUJEO MAX SOLOSTAR) 300 UNIT/ML SOPN Inject 6 Units into the skin at bedtime.      levothyroxine (EUTHYROX) 50 MCG tablet Take 1 tablet (50 mcg total) by mouth daily before breakfast. 30 tablet 12   loratadine (CLARITIN) 10 MG tablet Take 1 tablet by mouth once daily 90 tablet 0   losartan (COZAAR) 25 MG tablet Take 1 tablet by mouth once daily 90 tablet 1   Menaquinone-7 (VITAMIN K2 PO) Take 1 capsule by mouth daily.  rosuvastatin (CRESTOR) 5 MG tablet Take 1 tablet by mouth once daily 90 tablet 1   topiramate (TOPAMAX) 50 MG tablet Take 3 tablets (150 mg total) by mouth daily. nightly 90 tablet 11   VITAMIN D PO Take 2,000 Units by mouth 2 (two) times daily.     No current facility-administered medications for this visit.    PHYSICAL EXAMINATION: ECOG PERFORMANCE STATUS: 1 - Symptomatic but completely ambulatory  Vitals:   07/05/21 1400  BP: 140/78  Pulse: 76  Resp: 16  Temp: 97.7 F (36.5 C)   Filed Weights   07/05/21 1400  Weight: 128 lb 12.8 oz (58.4 kg)    Physical Exam Constitutional:      Comments: Accompanied by husband Sonia Side  HENT:     Head: Normocephalic and atraumatic.     Mouth/Throat:     Pharynx: No oropharyngeal exudate.  Eyes:     Conjunctiva/sclera: Conjunctivae normal.  Cardiovascular:     Rate and Rhythm: Normal rate and regular rhythm.  Pulmonary:     Effort: No respiratory distress.     Breath sounds: No wheezing.     Comments: Decreased air entry bilaterally. Abdominal:     General: There is no distension.      Palpations: Abdomen is soft.     Tenderness: There is abdominal tenderness. There is no guarding.  Musculoskeletal:        General: No tenderness or deformity.     Cervical back: Normal range of motion and neck supple.  Skin:    General: Skin is warm and dry.     Coloration: Skin is not pale.     Findings: No bruising.  Neurological:     Mental Status: She is alert and oriented to person, place, and time.  Psychiatric:        Mood and Affect: Mood and affect normal.        Behavior: Behavior normal.     LABORATORY DATA:  I have reviewed the data as listed Lab Results  Component Value Date   WBC 4.9 06/28/2021   HGB 12.4 06/28/2021   HCT 37.3 06/28/2021   MCV 100.5 (H) 06/28/2021   PLT 334 06/28/2021   Recent Labs    07/05/20 1434 05/13/21 0849 06/14/21 1528 06/28/21 1049  NA 140 139 130* 130*  K 4.4 5.7* 5.7* 3.7  CL 104 103 94* 95*  CO2 27 21 22 27   GLUCOSE 102* 139* 117* 73  BUN 24* 18 14 17   CREATININE 1.25* 1.31* 1.24* 1.08*  CALCIUM 9.1 10.3 9.9 9.0  GFRNONAA 43*  --   --  54*  GFRAA 49*  --   --   --   PROT 6.8 5.8*  --  6.5  ALBUMIN 3.8 4.0 4.3 4.1  AST 25 23  --  31  ALT 16 13  --  17  ALKPHOS 86 61  --  66  BILITOT 0.6 0.3  --  0.7     RADIOGRAPHIC STUDIES: I have personally reviewed the radiological images as listed and agreed with the findings in the report. No results found.  ASSESSMENT & PLAN:    Monoclonal gammopathy- hypogammaglobinemia incidentally noted on blood work while undergoing work-up for osteoporosis with orthopedics.  Labs today reviewed.  M spike is not observed.  LDH stable. Kappa lambda light chain slightly abnormal Clinically, she remains asymptomatic.  Recommend continued monitoring.  CKD stage III/diabetes-UNlikely related to multiple myeloma.  Stable.   Severe osteoporosis-followed by orthopedics.  History of smoking-lung cancer screening program.    DISPOSITION 1 year, labs (CBC, CMP, multiple myeloma panel, kappa  lambda light chain ratio), Follow-up with Dr. Rogue Bussing  No problem-specific [place] notes found for this encounter.  All questions were answered. The patient knows to call the clinic with any problems, questions or concerns.    Verlon Au, NP 07/05/2021

## 2021-07-14 ENCOUNTER — Other Ambulatory Visit: Payer: Self-pay | Admitting: Family Medicine

## 2021-07-14 NOTE — Telephone Encounter (Signed)
Requested medications are due for refill today.  yes  Requested medications are on the active medications list.  yes  Last refill. 07/22/2020  Future visit scheduled.   yes  Notes to clinic.  Medication not delegated.

## 2021-07-14 NOTE — Telephone Encounter (Signed)
Requested Prescriptions  Pending Prescriptions Disp Refills  . loratadine (CLARITIN) 10 MG tablet [Pharmacy Med Name: Loratadine 10 MG Oral Tablet] 90 tablet 0    Sig: Take 1 tablet by mouth once daily     Ear, Nose, and Throat:  Antihistamines Passed - 07/14/2021  5:30 AM      Passed - Valid encounter within last 12 months    Recent Outpatient Visits          2 months ago Encounter for [employer] annual wellness exam   [facility] Jerrol Banana., MD   8 months ago Primary hypertension   [facility] Jerrol Banana., MD   1 year ago Annual physical exam   [facility] Jerrol Banana., MD   1 year ago Age-related osteoporosis with current pathological fracture with routine healing, subsequent encounter   [facility] Jerrol Banana., MD   2 years ago History of tobacco abuse   [facility] Jerrol Banana., MD      Future Appointments            In 1 week Ralene Bathe, MD [place]   In 4 months Jerrol Banana., MD [facility]           . topiramate (TOPAMAX) 50 MG tablet [Pharmacy Med Name: Topiramate 50 MG Oral Tablet] 90 tablet 0    Sig: TAKE 3 TABLETS BY MOUTH NIGHTLY     Not Delegated - Neurology: Anticonvulsants - topiramate & zonisamide Failed - 07/14/2021  5:30 AM      Failed - This refill cannot be delegated      Failed - Cr in normal range and within 360 days    Creatinine, Ser  Date Value Ref Range Status  06/28/2021 1.08 (H) 0.44 - 1.00 mg/dL Final         Passed - CO2 in normal range and within 360 days    CO2  Date Value Ref Range Status  06/28/2021 27 22 - 32 mmol/L Final         Passed - Valid encounter within last 12 months    Recent Outpatient Visits          2 months ago Encounter for [employer] annual wellness exam   [facility] Jerrol Banana., MD   8 months ago Primary  hypertension   [facility] Jerrol Banana., MD   1 year ago Annual physical exam   [facility] Jerrol Banana., MD   1 year ago Age-related osteoporosis with current pathological fracture with routine healing, subsequent encounter   [facility] Jerrol Banana., MD   2 years ago History of tobacco abuse   [facility] Jerrol Banana., MD      Future Appointments            In 1 week Ralene Bathe, MD [place]   In 4 months Jerrol Banana., MD [facility]

## 2021-07-18 ENCOUNTER — Other Ambulatory Visit: Payer: Self-pay

## 2021-07-21 ENCOUNTER — Other Ambulatory Visit: Payer: Self-pay

## 2021-07-21 ENCOUNTER — Ambulatory Visit: Payer: Medicare HMO | Admitting: Dermatology

## 2021-07-21 DIAGNOSIS — D225 Melanocytic nevi of trunk: Secondary | ICD-10-CM | POA: Diagnosis not present

## 2021-07-21 DIAGNOSIS — L820 Inflamed seborrheic keratosis: Secondary | ICD-10-CM | POA: Diagnosis not present

## 2021-07-21 DIAGNOSIS — L578 Other skin changes due to chronic exposure to nonionizing radiation: Secondary | ICD-10-CM

## 2021-07-21 DIAGNOSIS — D220 Melanocytic nevi of lip: Secondary | ICD-10-CM

## 2021-07-21 DIAGNOSIS — D485 Neoplasm of uncertain behavior of skin: Secondary | ICD-10-CM

## 2021-07-21 DIAGNOSIS — L570 Actinic keratosis: Secondary | ICD-10-CM | POA: Diagnosis not present

## 2021-07-21 NOTE — Progress Notes (Signed)
New Patient Visit  Subjective  Debbie Dennis is a 75 y.o. female who presents for the following: Other (New patient - spots of face and chest that are scaly). She would like several other areas evaluated.  She has several spots that are itchy and scaly and would like treated.  She has growing spots.  The following portions of the chart were reviewed this encounter and updated as appropriate:   Tobacco  Allergies  Meds  Problems  Med Hx  Surg Hx  Fam Hx     Review of Systems:  No other skin or systemic complaints except as noted in HPI or Assessment and Plan.  Objective  Well appearing patient in no apparent distress; mood and affect are within normal limits.  A focused examination was performed including face, chest. Relevant physical exam findings are noted in the Assessment and Plan.  Left Upper Lat Eyelid x 1, face x 16 (17) Erythematous keratotic or waxy stuck-on papule or plaque.   Right upper lip vermillion border Flesh colored papule 0.6 cm  Right sup med breast Flesh colored papule 0.8 cm  Left nasal bridge Erythematous thin papules/macules with gritty scale.    Assessment & Plan   Actinic Damage - chronic, secondary to cumulative UV radiation exposure/sun exposure over time - diffuse scaly erythematous macules with underlying dyspigmentation - Recommend daily broad spectrum sunscreen SPF 30+ to sun-exposed areas, reapply every 2 hours as needed.  - Recommend staying in the shade or wearing long sleeves, sun glasses (UVA+UVB protection) and wide brim hats (4-inch brim around the entire circumference of the hat). - Call for new or changing lesions.  Inflamed seborrheic keratosis Left Upper Lat Eyelid margin x 1, face x 16  Destruction of lesion - Left Upper Lat Eyelid x 1, face x 16 Complexity: simple   Destruction method: cryotherapy   Informed consent: discussed and consent obtained   Timeout:  patient name, date of birth, surgical site, and procedure  verified Lesion destroyed using liquid nitrogen: Yes   Region frozen until ice ball extended beyond lesion: Yes   Outcome: patient tolerated procedure well with no complications   Post-procedure details: wound care instructions given    Neoplasm of uncertain behavior of skin (2) Right upper lip vermillion border  Epidermal / dermal shaving  Lesion diameter (cm):  0.6 Informed consent: discussed and consent obtained   Timeout: patient name, date of birth, surgical site, and procedure verified   Procedure prep:  Patient was prepped and draped in usual sterile fashion Prep type:  Isopropyl alcohol Anesthesia: the lesion was anesthetized in a standard fashion   Anesthetic:  1% lidocaine w/ epinephrine 1-100,000 buffered w/ 8.4% NaHCO3 Instrument used: flexible razor blade   Hemostasis achieved with: pressure, aluminum chloride and electrodesiccation   Outcome: patient tolerated procedure well   Post-procedure details: sterile dressing applied and wound care instructions given   Dressing type: bandage and petrolatum    Specimen 1 - Surgical pathology Differential Diagnosis: Irritated nevus R/O dysplasia Check Margins: No  Right sup med breast -0.8 cm  Epidermal / dermal shaving  Informed consent: discussed and consent obtained   Timeout: patient name, date of birth, surgical site, and procedure verified   Procedure prep:  Patient was prepped and draped in usual sterile fashion Prep type:  Isopropyl alcohol Anesthesia: the lesion was anesthetized in a standard fashion   Anesthetic:  1% lidocaine w/ epinephrine 1-100,000 buffered w/ 8.4% NaHCO3 Instrument used: flexible razor blade  Hemostasis achieved with: pressure, aluminum chloride and electrodesiccation   Outcome: patient tolerated procedure well   Post-procedure details: sterile dressing applied and wound care instructions given   Dressing type: bandage and petrolatum    Specimen 2 - Surgical pathology Differential  Diagnosis: Irritated nevus R/O dysplasia Check Margins: No  AK (actinic keratosis) Left nasal bridge  Destruction of lesion - Left nasal bridge Complexity: simple   Destruction method: cryotherapy   Informed consent: discussed and consent obtained   Timeout:  patient name, date of birth, surgical site, and procedure verified Lesion destroyed using liquid nitrogen: Yes   Region frozen until ice ball extended beyond lesion: Yes   Outcome: patient tolerated procedure well with no complications   Post-procedure details: wound care instructions given    Return for 3-4 months , Follow up.  I, Ashok Cordia, CMA, am acting as scribe for Sarina Ser, MD .  Documentation: I have reviewed the above documentation for accuracy and completeness, and I agree with the above.  Sarina Ser, MD

## 2021-07-21 NOTE — Patient Instructions (Signed)
Cryotherapy Aftercare  Wash gently with soap and water everyday.   Apply Vaseline and Band-Aid daily until healed.    Wound Care Instructions  Cleanse wound gently with soap and water once a day then pat dry with clean gauze. Apply a thing coat of Petrolatum (petroleum jelly, "Vaseline") over the wound (unless you have an allergy to this). We recommend that you use a new, sterile tube of Vaseline. Do not pick or remove scabs. Do not remove the yellow or white "healing tissue" from the base of the wound.  Cover the wound with fresh, clean, nonstick gauze and secure with paper tape. You may use Band-Aids in place of gauze and tape if the would is small enough, but would recommend trimming much of the tape off as there is often too much. Sometimes Band-Aids can irritate the skin.  You should call the office for your biopsy report after 1 week if you have not already been contacted.  If you experience any problems, such as abnormal amounts of bleeding, swelling, significant bruising, significant pain, or evidence of infection, please call the office immediately.  FOR ADULT SURGERY PATIENTS: If you need something for pain relief you may take 1 extra strength Tylenol (acetaminophen) AND 2 Ibuprofen (200mg each) together every 4 hours as needed for pain. (do not take these if you are allergic to them or if you have a reason you should not take them.) Typically, you may only need pain medication for 1 to 3 days.     If you have any questions or concerns for your doctor, please call our main line at [phone] and press option 4 to reach your doctor's medical assistant. If no one answers, please leave a voicemail as directed and we will return your call as soon as possible. Messages left after 4 pm will be answered the following business day.   You may also send us a message via MyChart. We typically respond to MyChart messages within 1-2 business days.  For prescription refills, please ask your  pharmacy to contact our office. Our fax number is [phone].  If you have an urgent issue when the clinic is closed that cannot wait until the next business day, you can [place] your doctor at the number below.    Please note that while we do our best to be available for urgent issues outside of office hours, we are not available 24/7.   If you have an urgent issue and are unable to reach us, you may choose to seek medical care at your doctor's office, retail clinic, urgent care center, or emergency room.  If you have a medical emergency, please immediately call 911 or go to the emergency department.  Pager Numbers  - Dr. Kowalski: [phone]  - Dr. Moye: [phone]  - Dr. Stewart: [phone]  In the event of inclement weather, please call our main line at [phone] for an update on the status of any delays or closures.  Dermatology Medication Tips: Please keep the boxes that topical medications come in in order to help keep track of the instructions about where and how to use these. Pharmacies typically print the medication instructions only on the boxes and not directly on the medication tubes.   If your medication is too expensive, please contact our office at [phone] option 4 or send us a message through MyChart.   We are unable to tell what your co-pay for medications will be in advance as this is different depending on your insurance coverage.   However, we may be able to find a substitute medication at lower cost or fill out paperwork to get insurance to cover a needed medication.   If a prior authorization is required to get your medication covered by your insurance company, please allow us 1-2 business days to complete this process.  Drug prices often vary depending on where the prescription is filled and some pharmacies may offer cheaper prices.  The website www.goodrx.com contains coupons for medications through different pharmacies. The prices here do not  account for what the cost may be with help from insurance (it may be cheaper with your insurance), but the website can give you the price if you did not use any insurance.  - You can print the associated coupon and take it with your prescription to the pharmacy.  - You may also stop by our office during regular business hours and pick up a GoodRx coupon card.  - If you need your prescription sent electronically to a different pharmacy, notify our office through [place] MyChart or by phone at [phone] option 4.  

## 2021-07-22 ENCOUNTER — Ambulatory Visit (INDEPENDENT_AMBULATORY_CARE_PROVIDER_SITE_OTHER): Payer: Medicare HMO

## 2021-07-22 DIAGNOSIS — N1832 Chronic kidney disease, stage 3b: Secondary | ICD-10-CM

## 2021-07-22 NOTE — Chronic Care Management (AMB) (Signed)
Chronic Care Management   CCM RN Visit Note  07/22/2021 Name: Debbie Dennis MRN: [id number] DOB: 1946/02/19  Subjective: Debbie Dennis is a 75 y.o. year old female who is a primary care patient of Jerrol Banana., MD. The care management team was consulted for assistance with disease management and care coordination needs.    Engaged with patient by telephone for follow up visit in response to provider referral for case management and care coordination services.   Consent to Services:  The patient was given information about Chronic Care Management services, agreed to services, and gave verbal consent prior to initiation of services.  Please see initial visit note for detailed documentation.    Assessment: Review of patient past medical history, allergies, medications, health status, including review of consultants reports, laboratory and other test data, was performed as part of comprehensive evaluation and provision of chronic care management services.   SDOH (Social Determinants of Health) assessments and interventions performed: No  CCM Care Plan  Allergies  Allergen Reactions   Codeine Nausea And Vomiting   Sulfa Antibiotics Rash    Outpatient Encounter Medications as of 07/22/2021  Medication Sig   ALPRAZolam (XANAX) 0.5 MG tablet Take 1 tablet by mouth twice daily as needed for anxiety   CALCIUM PO Take 600 mg by mouth. One in the morning and one at bedtime   denosumab (PROLIA) 60 MG/ML SOSY injection Inject 60 mg into the skin every 6 (six) months.   fluticasone (FLONASE) 50 MCG/ACT nasal spray Place 2 sprays into both nostrils daily as needed for allergies.    gabapentin (NEURONTIN) 100 MG capsule TAKE 1 CAPSULE BY MOUTH THREE TIMES DAILY   guaiFENesin (MUCINEX) 600 MG 12 hr tablet Take 600 mg by mouth 2 (two) times daily as needed.   insulin aspart (NOVOLOG) 100 UNIT/ML injection INJECT UP TO 60 UNITS SUBCUTANEOUSLY DAILY PER  SLIDING  SCALE (Patient taking  differently: Inject 1-60 Units into the skin See admin instructions. INJECT UP TO 60 UNITS SUBCUTANEOUSLY DAILY PER  SLIDING  SCALE)   Insulin Glargine, 2 Unit Dial, (TOUJEO MAX SOLOSTAR) 300 UNIT/ML SOPN Inject 6 Units into the skin at bedtime.    levothyroxine (EUTHYROX) 50 MCG tablet Take 1 tablet (50 mcg total) by mouth daily before breakfast.   loratadine (CLARITIN) 10 MG tablet Take 1 tablet by mouth once daily   losartan (COZAAR) 25 MG tablet Take 1 tablet by mouth once daily   Menaquinone-7 (VITAMIN K2 PO) Take 1 capsule by mouth daily.   rosuvastatin (CRESTOR) 5 MG tablet Take 1 tablet by mouth once daily   topiramate (TOPAMAX) 50 MG tablet TAKE 3 TABLETS BY MOUTH NIGHTLY   VITAMIN D PO Take 2,000 Units by mouth 2 (two) times daily.   No facility-administered encounter medications on file as of 07/22/2021.    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   Insulin dependent type 1 diabetes mellitus ([place]) 02/05/2015   Bloodgood disease 02/05/2015   Acid reflux 02/05/2015   HLD (hyperlipidemia) 02/05/2015   BP (high blood pressure) 02/05/2015   Adult hypothyroidism 02/05/2015   Adaptive colitis 02/05/2015   Restless leg 02/05/2015   History of tobacco abuse 02/05/2015   Venous stasis syndrome 02/05/2015    Conditions to be addressed/monitored:DMII  Patient Care Plan: Diabetes (Adult)     Problem Identified:  Disease Progression (Diabetes)      Long-Range Goal: Disease Progression Prevented or Minimized Completed 07/22/2021  Start Date: 05/20/2021  Expected End Date: 08/18/2021  Priority: High  Note:    Current Barriers:  Chronic disease management support and educational needs r/t to Diabetes self-management   Case Manager Clinical Goal(s):  Over the next 90 days, patient will demonstrate adherence to  prescribed treatment plan for Diabetes self-management as evidenced by daily monitoring and recording of CBG, adherence to ADA/ carb modified diet and adherence to prescribed medication regimen.  Interventions:  Collaboration with Jerrol Banana., MD regarding development and update of comprehensive plan of care as evidenced by provider attestation and co-signature Inter-disciplinary care team collaboration (see longitudinal plan of care) Discussed compliance medications and treatment recommendations. Reports excellent compliance with medications. Continues to consistently monitor blood glucose readings as advised. Reports fasting readings have remained within range. She continues to administer sliding scale insulin as advised. Reports doing well with nutritional intake. She remains very active with yard work and tasks around the home. She remains very motivated to improve her overall health. Declines need for additional diabetic resources.    Patient Goals/Self-Care Activities Self-administer medications as prescribed Attend all scheduled provider appointments Monitor blood glucose levels consistently and utilize recommended interventions Adhere to prescribed ADA/carb modified Notify provider or care management team with questions or new concerns as needed       Patient Care Plan: Fall Risk (Adult)     Problem Identified: Fall Risk      Long-Range Goal: Absence of Fall and Fall-Related Injury   Start Date: 12/02/2020  Expected End Date: 04/01/2021  Priority: High  Note:      Current Barriers:  High Risk for Falls d/t Impaired Gait  Clinical Goal(s):  Over the next 120 days, patient will not experience falls or require emergent care d/t fall related injuries  Interventions:  Collaboration with Jerrol Banana., MD regarding development and update of comprehensive plan of care as evidenced by provider attestation and co-signature Inter-disciplinary care team  collaboration (see longitudinal plan of care) Reviewed medication changes. Recently changed from monthly injections. Changed to Prolia injections every six months to reduce risks of fractures. Reports compliance with prescribed medications and recommended supplements. Reviewed safety and fall prevention measures. Denies recent falls. Encouraged to continue using her cane or appropriate assistive device when ambulating. Advised to avoid prolonged activity to prevent accident falls d/t exertion. Encouraged to avoid attempting to lift weighted objects and request assistance when needed. Encouraged to continue using caution when changing positions and ambulating on unlevel surfaces. She continues to perform ADL's and small tasks independently. Spouse remains readily available to assist as needed.   Self-Care Deficits/Patient Goals: -Utilize assistive device appropriately with all ambulation -Ensure pathways are clear and well lit -Change positions slowly -Wear secure fitting, skid free footwear at all times when ambulating -Complete Prolia injections as scheduled -Notify provider or care management team for questions and new concerns as needed   Goal Met      PLAN: Debbie Dennis has met her care management goals. Agreed to call or contact the clinic if she requires additional outreach. The care management team will gladly assist.   [facility]rist[facility] Care Management [facility] [phone]

## 2021-07-22 NOTE — Patient Instructions (Addendum)
Thank you for allowing the Chronic Care Management team to participate in your care.    Patient Care Plan: Diabetes (Adult)     Problem Identified: Disease Progression (Diabetes)      Long-Range Goal: Disease Progression Prevented or Minimized Completed 07/22/2021  Start Date: 05/20/2021  Expected End Date: 08/18/2021  Priority: High  Note:    Current Barriers:  Chronic disease management support and educational needs r/t to Diabetes self-management   Case Manager Clinical Goal(s):  Over the next 90 days, patient will demonstrate adherence to prescribed treatment plan for Diabetes self-management as evidenced by daily monitoring and recording of CBG, adherence to ADA/ carb modified diet and adherence to prescribed medication regimen.  Interventions:  Collaboration with Jerrol Banana., MD regarding development and update of comprehensive plan of care as evidenced by provider attestation and co-signature Inter-disciplinary care team collaboration (see longitudinal plan of care) Discussed compliance medications and treatment recommendations. Reports excellent compliance with medications. Continues to consistently monitor blood glucose readings as advised. Reports fasting readings have remained within range. She continues to administer sliding scale insulin as advised. Reports doing well with nutritional intake. She remains very active with yard work and tasks around the home. She remains very motivated to improve her overall health. Declines need for additional diabetic resources.    Patient Goals/Self-Care Activities Self-administer medications as prescribed Attend all scheduled provider appointments Monitor blood glucose levels consistently and utilize recommended interventions Adhere to prescribed ADA/carb modified Notify provider or care management team with questions or new concerns as needed       Patient Care Plan: Fall Risk (Adult)     Problem Identified: Fall  Risk      Long-Range Goal: Absence of Fall and Fall-Related Injury   Start Date: 12/02/2020  Expected End Date: 04/01/2021  Priority: High  Note:      Current Barriers:  High Risk for Falls d/t Impaired Gait  Clinical Goal(s):  Over the next 120 days, patient will not experience falls or require emergent care d/t fall related injuries  Interventions:  Collaboration with Jerrol Banana., MD regarding development and update of comprehensive plan of care as evidenced by provider attestation and co-signature Inter-disciplinary care team collaboration (see longitudinal plan of care) Reviewed medication changes. Recently changed from monthly injections. Changed to Prolia injections every six months to reduce risks of fractures. Reports compliance with prescribed medications and recommended supplements. Reviewed safety and fall prevention measures. Denies recent falls. Encouraged to continue using her cane or appropriate assistive device when ambulating. Advised to avoid prolonged activity to prevent accident falls d/t exertion. Encouraged to avoid attempting to lift weighted objects and request assistance when needed. Encouraged to continue using caution when changing positions and ambulating on unlevel surfaces. She continues to perform ADL's and small tasks independently. Spouse remains readily available to assist as needed.   Self-Care Deficits/Patient Goals: -Utilize assistive device appropriately with all ambulation -Ensure pathways are clear and well lit -Change positions slowly -Wear secure fitting, skid free footwear at all times when ambulating -Complete Prolia injections as scheduled -Notify provider or care management team for questions and new concerns as needed   Goal Met     Mrs. Silvera verbalized understanding of the information discussed during the telephonic outreach. Declined need for mailed/printed instructions. She has met her care management goals. Agreed to call  or contact the clinic if she requires additional outreach. The care management team will gladly assist.   [facility] Care  Management [facility] [phone]

## 2021-07-26 ENCOUNTER — Telehealth: Payer: Self-pay

## 2021-07-26 ENCOUNTER — Encounter: Payer: Self-pay | Admitting: Dermatology

## 2021-07-26 NOTE — Telephone Encounter (Signed)
LM on VM please return my call  

## 2021-07-27 ENCOUNTER — Telehealth: Payer: Self-pay

## 2021-07-27 IMAGING — CT CT ABD-PELV W/O CM
2 of 4 series · 15 of 46 positions shown, 17 images · non-contrast
Comparison: None.

CLINICAL DATA: Abdominal pain, abdominal distension

EXAM:
CT ABDOMEN AND PELVIS WITHOUT CONTRAST
TECHNIQUE: Multidetector CT imaging of the abdomen and pelvis was performed
following the standard protocol without IV contrast.

[Series 2: axial st · axial · 0.68mm/px · z∈[+959,+1324]mm · 12 of 83 slices shown, 14 images]
[im 5/83  soft-tissue]
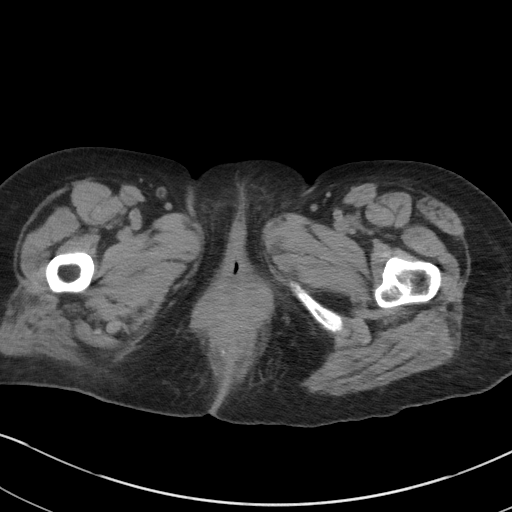
[im 5/83  bone]
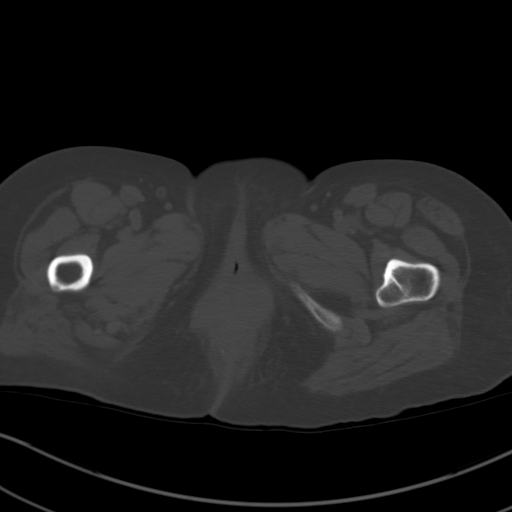
[im 13/83  soft-tissue]
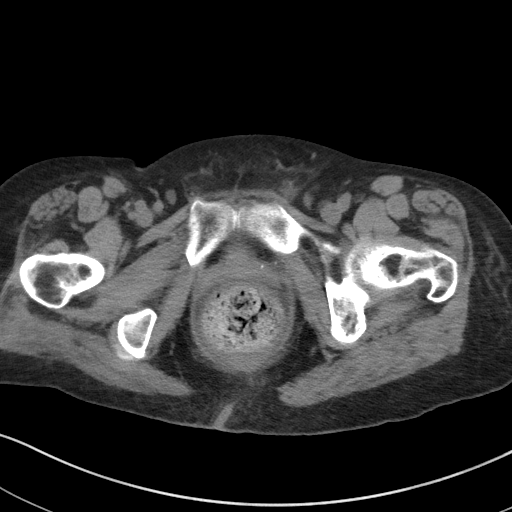
[im 17/83  soft-tissue]
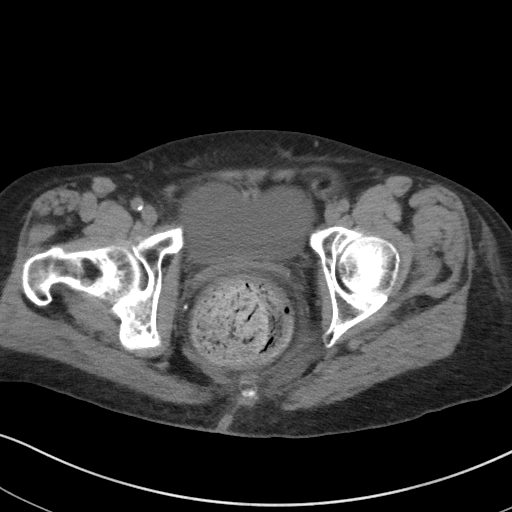
[im 25/83  soft-tissue]
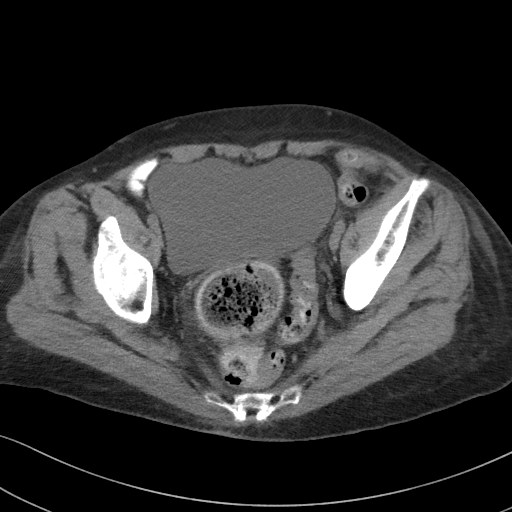
[im 33/83  soft-tissue]
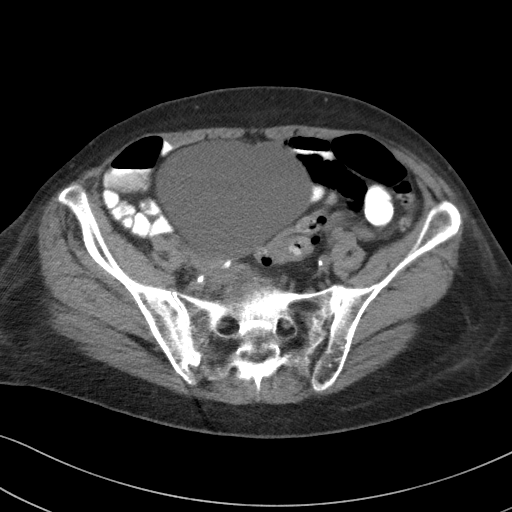
[im 37/83  soft-tissue]
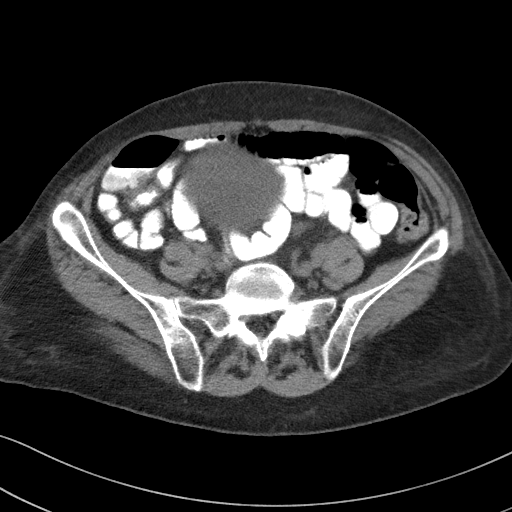
[im 46/83  soft-tissue]
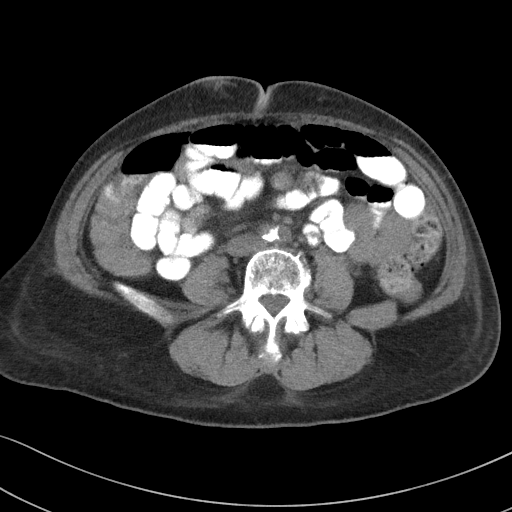
[im 50/83  soft-tissue]
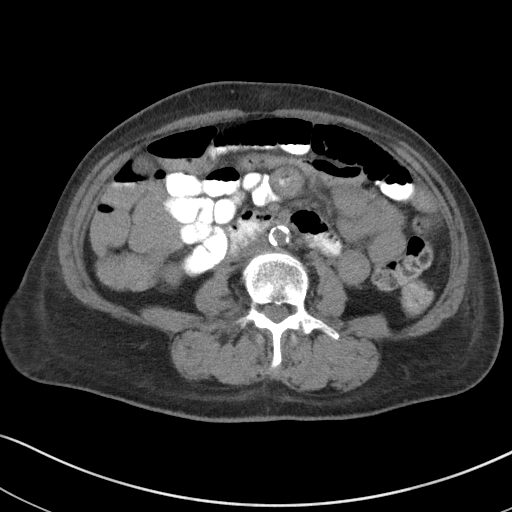
[im 58/83  soft-tissue]
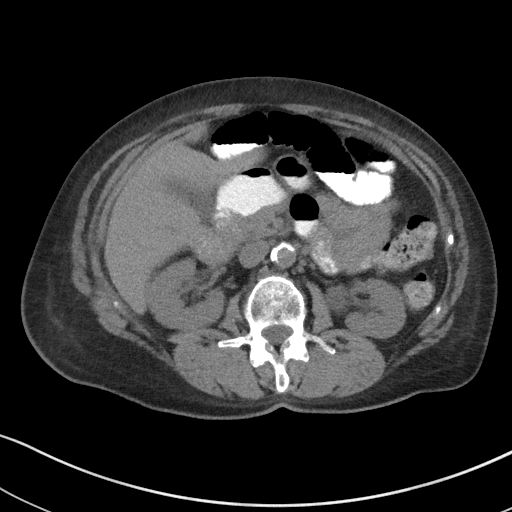
[im 58/83  bone]
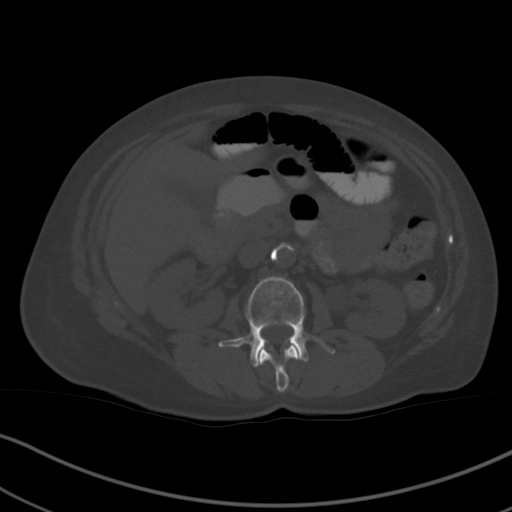
[im 66/83  soft-tissue]
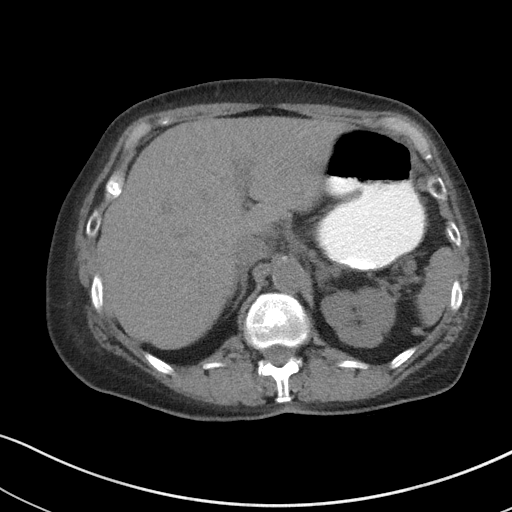
[im 70/83  soft-tissue]
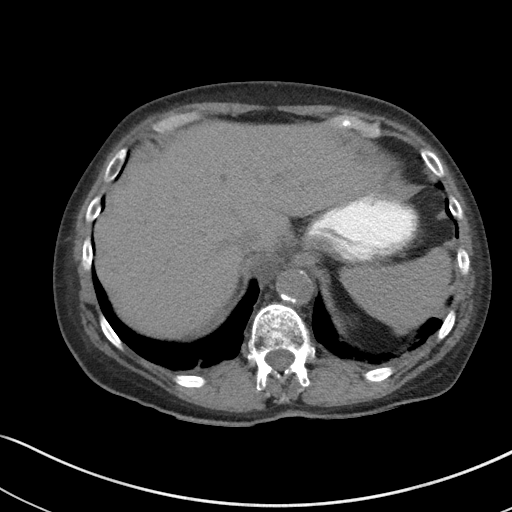
[im 78/83  soft-tissue]
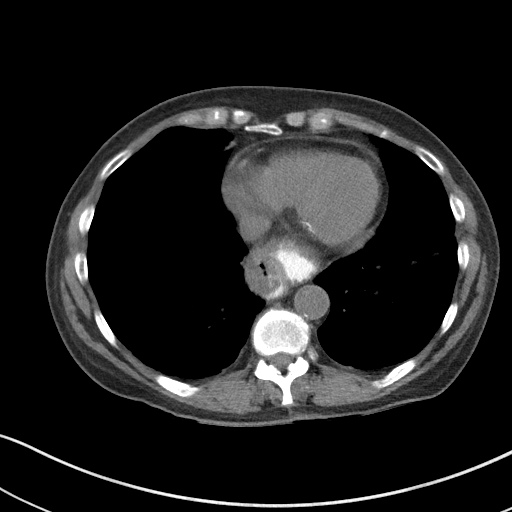

[Series 5: coronal st · coronal · 0.59mm/px · 3 of 78 slices shown]
[im 26/78  soft-tissue]
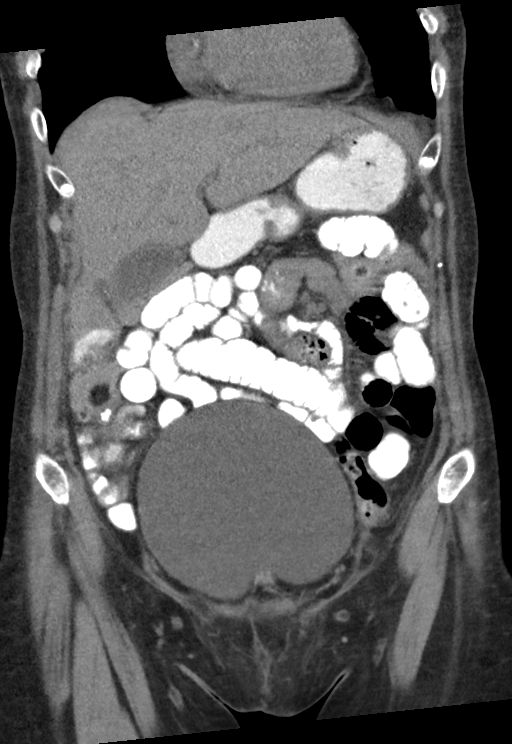
[im 35/78  soft-tissue]
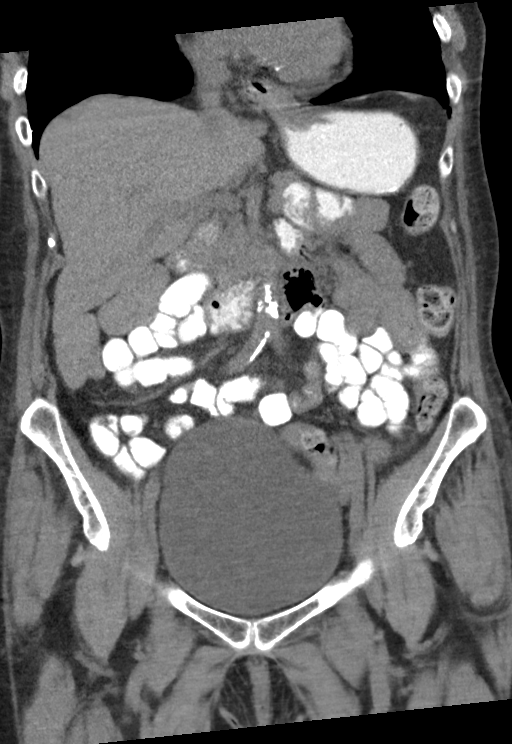
[im 43/78  soft-tissue]
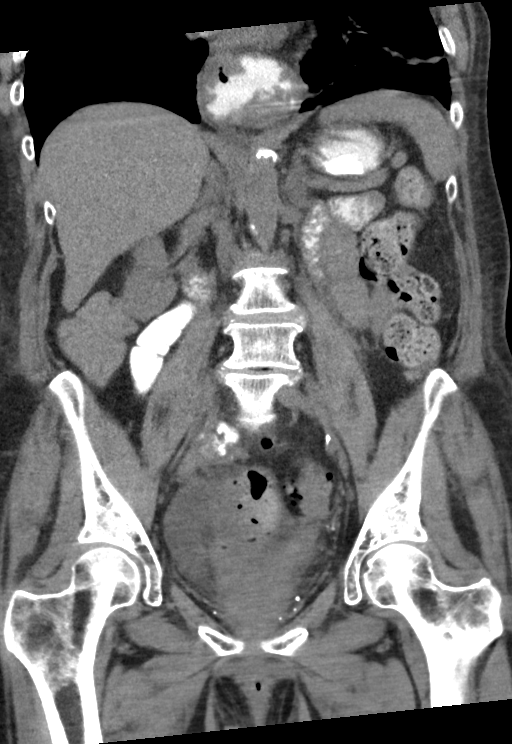

[15 of 46 positions shown; findings below may reference images not displayed]

FINDINGS: Lower chest: Mild bibasilar atelectasis. The visualized heart and
pericardium are unremarkable. Moderate hiatal hernia.

Hepatobiliary: Cholelithiasis. Multiple tiny hypodensities are seen
scattered throughout the liver measuring up to 11 mm which are
poorly characterized on this examination in absence of contrast
administration but likely represent tiny hepatic cysts. The liver is
otherwise unremarkable. No intra or extrahepatic biliary ductal
dilation.

Pancreas: Unremarkable

Spleen: Unremarkable

Adrenals/Urinary Tract: The adrenal glands are unremarkable. The
kidneys are normal in size and position. At least 2 left-sided and a
single right-sided nonobstructing calculi are seen within the lower
poles of the kidneys measuring up to 3 mm. No ureteral calculi. No
hydronephrosis. The bladder is mildly distended, but is otherwise
unremarkable.

Stomach/Bowel: The stomach,, small bowel, and large bowel are
unremarkable save for mild sigmoid diverticulosis. The appendix is
not visualized and may be absent. No free intraperitoneal gas or
fluid. The rectal vault is distended with large amount of stool and
there is mild perirectal inflammatory stranding suggesting changes
of stercoral proctitis.

Vascular/Lymphatic: There is moderate aortoiliac atherosclerotic
calcification present without evidence of aneurysm. No pathologic
adenopathy within the abdomen and pelvis.

Reproductive: Uterus absent.  No adnexal masses.

Other: None significant

Musculoskeletal: Degenerative changes are seen within the lumbar
spine. No lytic or blastic bone lesions are seen.
IMPRESSION: 1. The rectal vault is distended with large amount of stool and
there is mild perirectal inflammatory stranding suggesting changes
of stercoral proctitis. Correlation for fecal impaction may be
helpful.
2. Bilateral nonobstructing renal calculi.
3. Cholelithiasis.
4. Moderate hiatal hernia.
5. Aortic atherosclerosis.

Aortic Atherosclerosis ([id number]).

## 2021-07-27 IMAGING — US US ABDOMEN LIMITED
1 series · 14 of 25 positions shown · non-contrast
Comparison: None.

CLINICAL DATA: Cholelithiasis, elevated liver function tests

EXAM:
ULTRASOUND ABDOMEN LIMITED RIGHT UPPER QUADRANT

[Series 1: us abdomen limited · 14 of 60 slices shown]
[im 1/60]
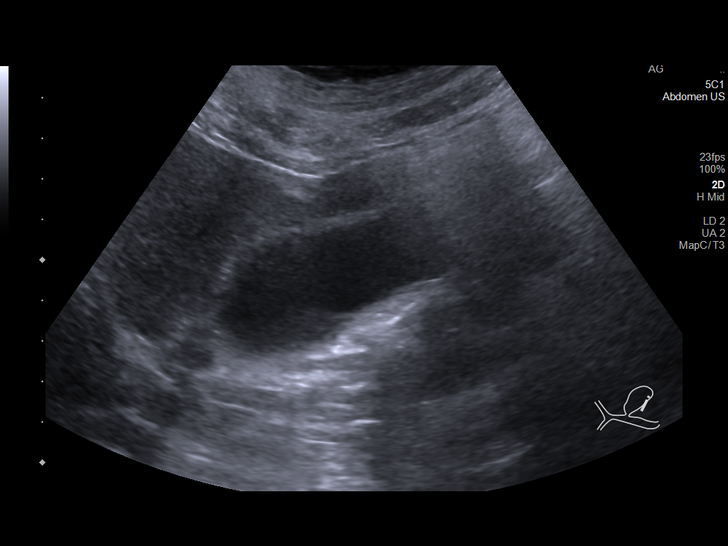
[im 5/60]
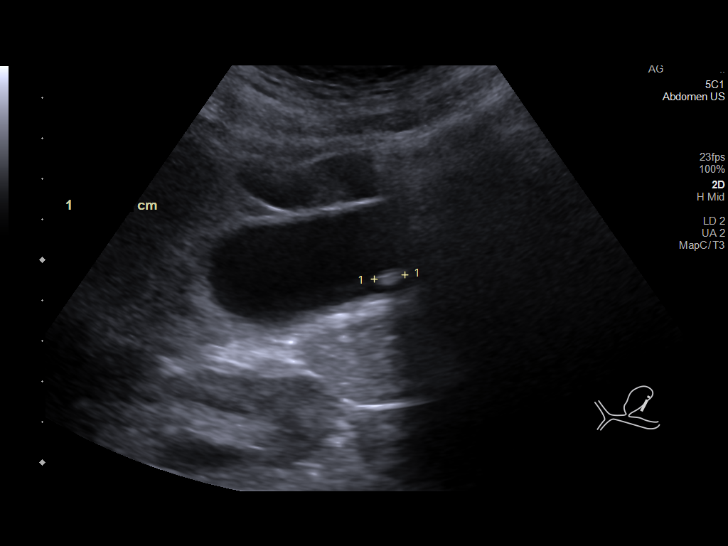
[im 10/60]
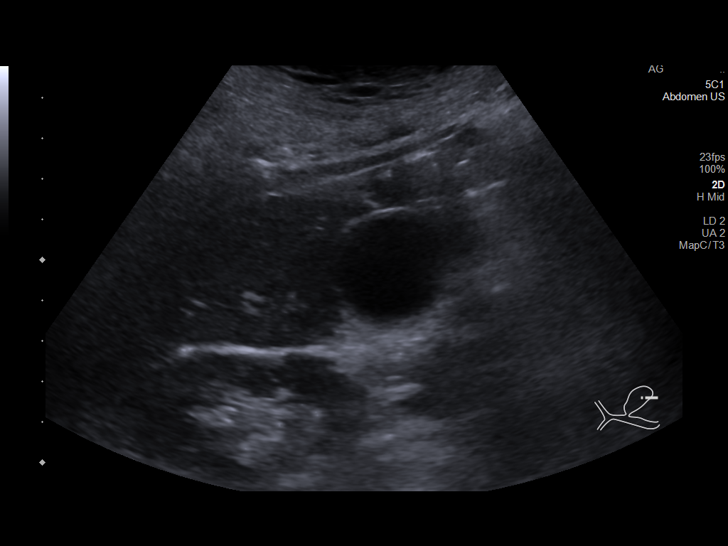
[im 15/60]
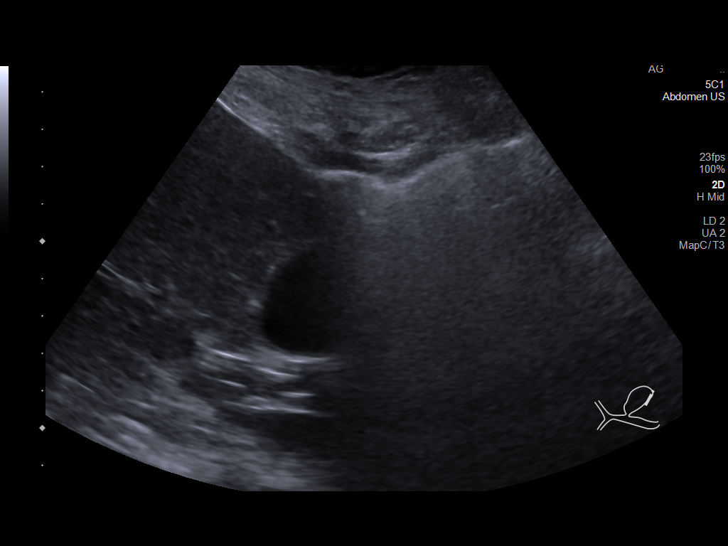
[im 20/60]
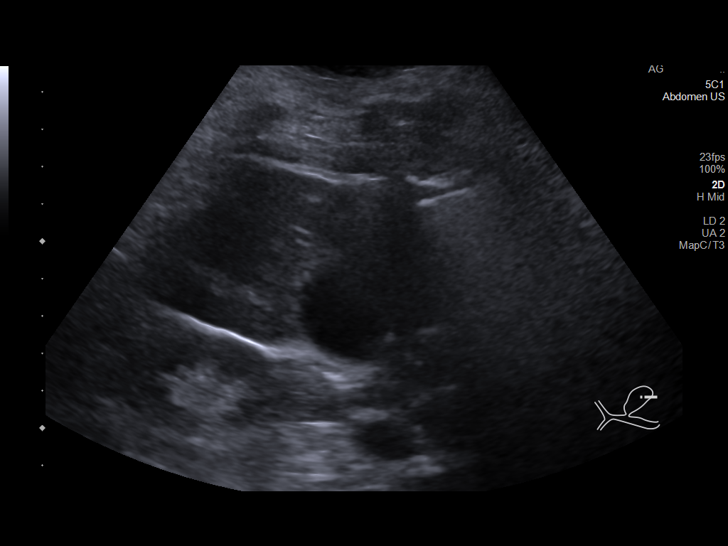
[im 23/60]
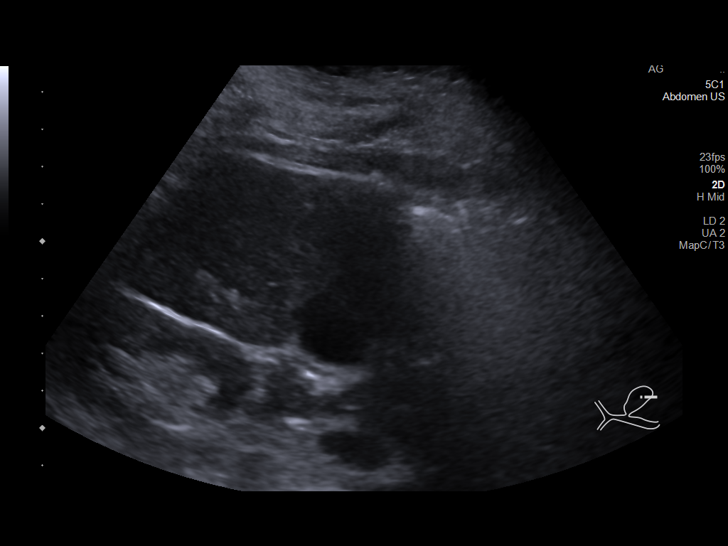
[im 28/60]
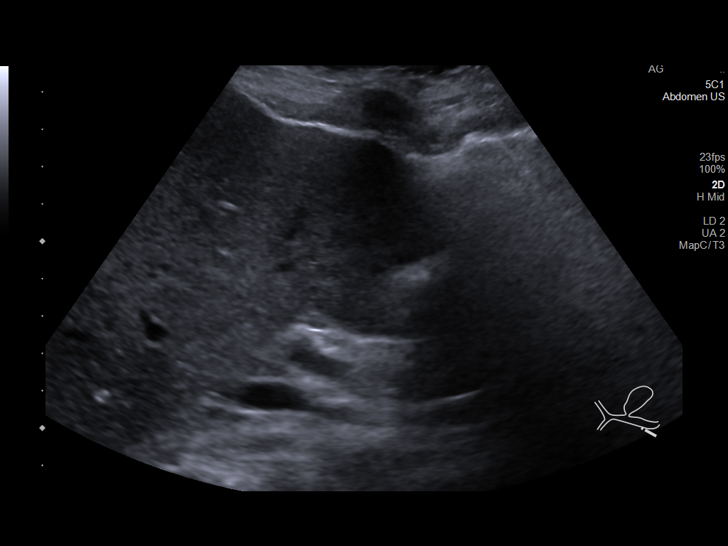
[im 32/60]
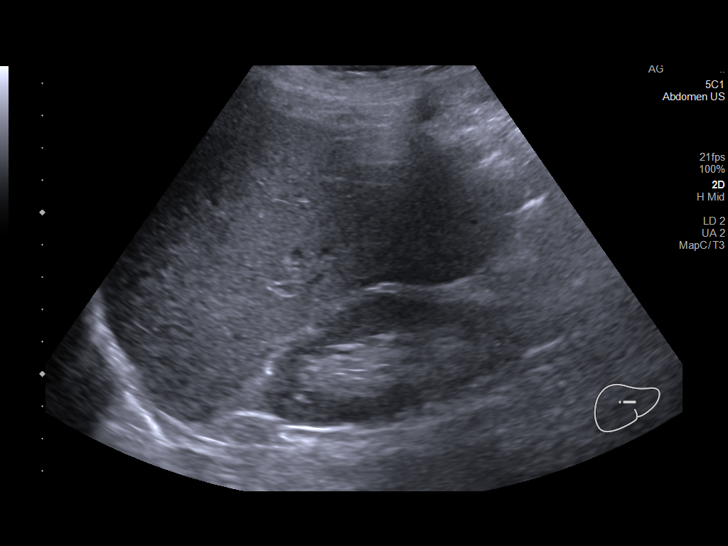
[im 37/60]
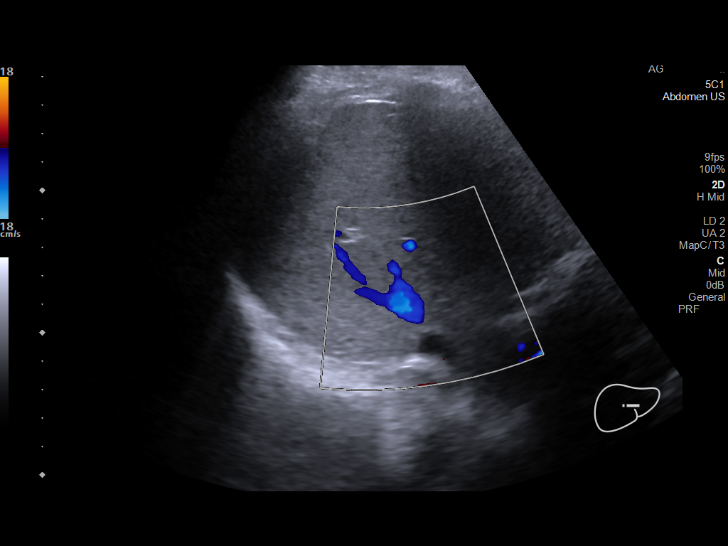
[im 40/60]
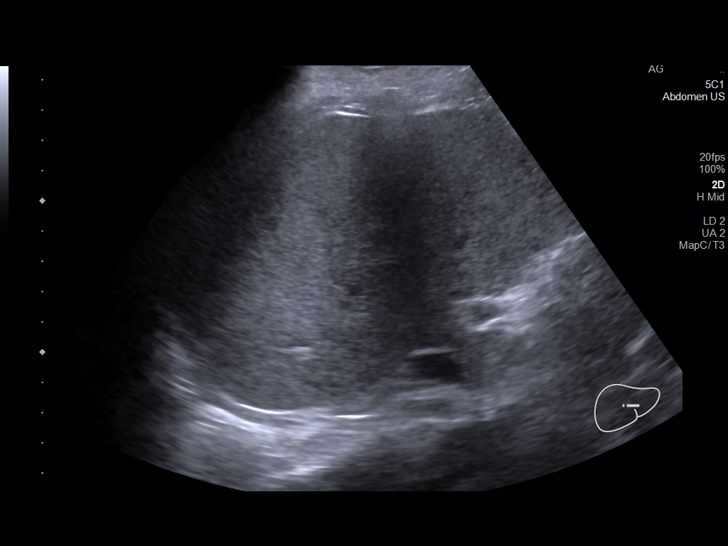
[im 45/60]
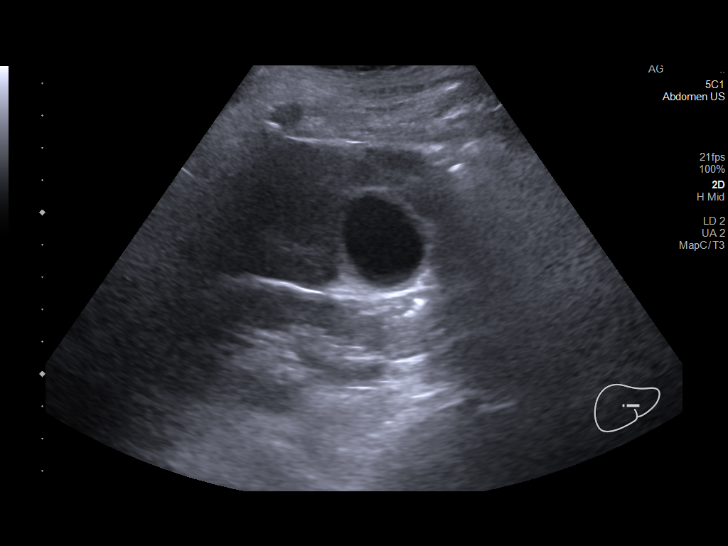
[im 50/60]
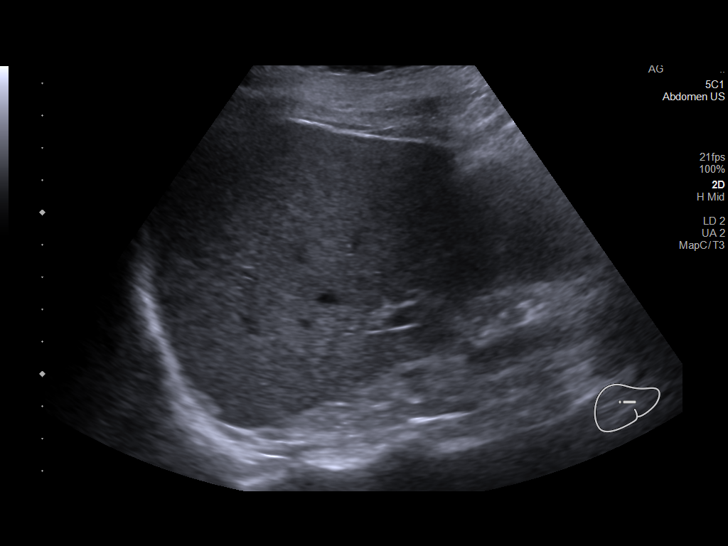
[im 55/60]
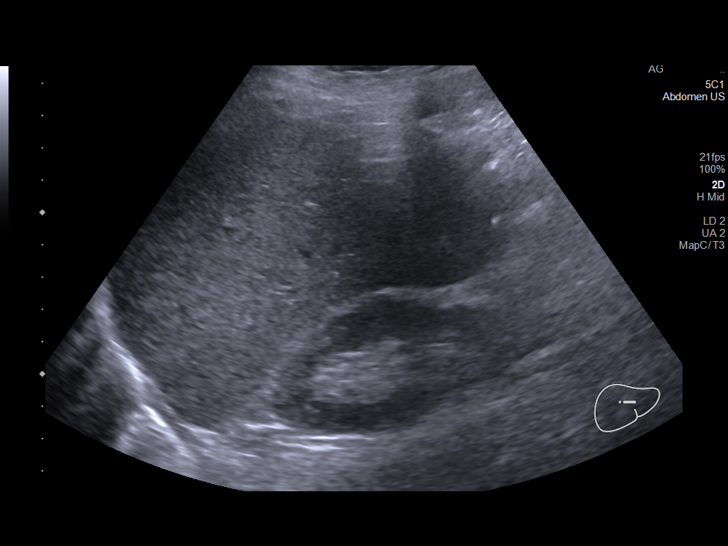
[im 60/60]
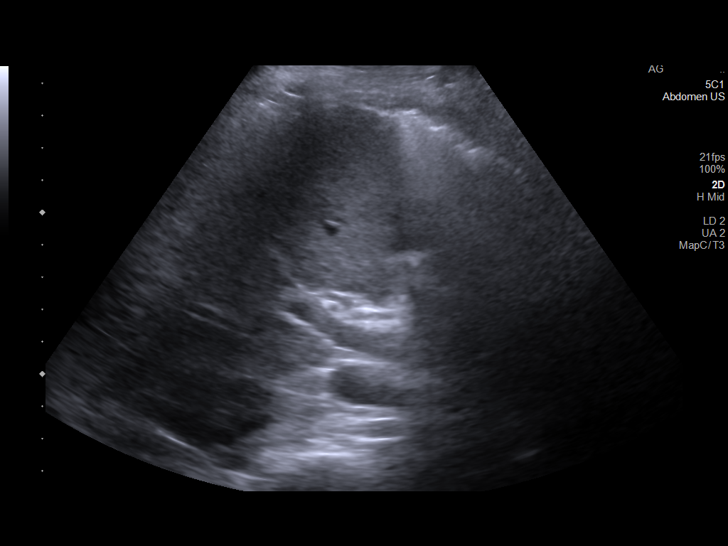

[14 of 25 positions shown; findings below may reference images not displayed]

FINDINGS: Gallbladder:

Several mobile gallstones are seen within the gallbladder lumen. The
gallbladder, however, is not distended, there is no gallbladder wall
thickening, and no pericholecystic fluid is identified. The
sonographic Murphy sign is reportedly negative.

Common bile duct:

Diameter: 3 mm in proximal diameter

Liver:

No focal lesion identified. Within normal limits in parenchymal
echogenicity. Portal vein is patent on color Doppler imaging with
normal direction of blood flow towards the liver.

Other: No ascites
IMPRESSION: Cholelithiasis without sonographic evidence of acute cholecystitis.

## 2021-07-27 NOTE — Telephone Encounter (Signed)
-----   Message from Ralene Bathe, MD sent at 07/26/2021 12:49 PM EDT ----- Diagnosis 1. Skin , right upper lip vermilion border MELANOCYTIC NEVUS, INTRADERMAL TYPE, IRRITATED 2. Skin , right sup med breast MELANOCYTIC NEVUS, INTRADERMAL TYPE, IRRITATED  1&2 - Both benign irritated moles No further treatment needed

## 2021-07-27 NOTE — Telephone Encounter (Signed)
Advised pt of bx results/sh ?

## 2021-08-01 DIAGNOSIS — N1832 Chronic kidney disease, stage 3b: Secondary | ICD-10-CM | POA: Diagnosis not present

## 2021-08-01 DIAGNOSIS — E1022 Type 1 diabetes mellitus with diabetic chronic kidney disease: Secondary | ICD-10-CM | POA: Diagnosis not present

## 2021-08-08 ENCOUNTER — Telehealth: Payer: Self-pay | Admitting: Family Medicine

## 2021-08-08 DIAGNOSIS — E039 Hypothyroidism, unspecified: Secondary | ICD-10-CM

## 2021-08-08 MED ORDER — LEVOTHYROXINE SODIUM 50 MCG PO TABS: 50.0000 ug | ORAL_TABLET | Freq: Every day | ORAL | 12 refills | Status: DC

## 2021-08-08 NOTE — Telephone Encounter (Signed)
Rx was sent to pharmacy. 

## 2021-08-08 NOTE — Telephone Encounter (Signed)
[facility] faxed refill request for the following medications:   levothyroxine (EUTHYROX) 50 MCG tablet     Please advise.  

## 2021-08-09 ENCOUNTER — Other Ambulatory Visit: Payer: Self-pay | Admitting: Family Medicine

## 2021-08-23 DIAGNOSIS — M810 Age-related osteoporosis without current pathological fracture: Secondary | ICD-10-CM | POA: Diagnosis not present

## 2021-08-23 DIAGNOSIS — E559 Vitamin D deficiency, unspecified: Secondary | ICD-10-CM | POA: Diagnosis not present

## 2021-08-26 ENCOUNTER — Other Ambulatory Visit: Payer: Self-pay | Admitting: Family Medicine

## 2021-08-26 DIAGNOSIS — M5431 Sciatica, right side: Secondary | ICD-10-CM

## 2021-08-26 DIAGNOSIS — M79604 Pain in right leg: Secondary | ICD-10-CM

## 2021-08-31 NOTE — Telephone Encounter (Addendum)
[facility] pharmacy notified

## 2021-08-31 NOTE — Telephone Encounter (Signed)
Jerrol Banana., MD  You 1 hour ago (12:20 PM)   [place] with me

## 2021-08-31 NOTE — Telephone Encounter (Signed)
Husband calling back for concerns that no one has called the harmacy and given the OK to change brand names for pt's levothyroxine.  He said he is going to town about 2 pm and would like to be able to pick this Rx up then

## 2021-08-31 NOTE — Telephone Encounter (Signed)
Pts husband called back, stated he was on hold then disconnected while waiting for Dr Rosanna Randy to okay the medication since the manufacturer changed. Pts husband wanted to make sure this was okay.

## 2021-08-31 NOTE — Telephone Encounter (Signed)
Spoke with Patient husband and they are still not able to pick up RX. It has to be noted on the RX that it is okay to change Manufacture company. [facility] can no longer get Euthyrox. Pharmacy stated that if the provider sent in a letter that this was okay they could take care of all patients with this issue. Call [place] with any issues.

## 2021-08-31 NOTE — Telephone Encounter (Signed)
Tried calling pt does not have voice mail.  Please advise that it is okay to change brands of levothyroxine.   Thanks,   -Mickel Baas

## 2021-09-12 ENCOUNTER — Other Ambulatory Visit: Payer: Self-pay | Admitting: Family Medicine

## 2021-09-12 NOTE — Telephone Encounter (Signed)
Requested medication (s) are due for refill today: Yes  Requested medication (s) are on the active medication list: Yes  Last refill:  07/18/21 #90/1 RF  Future visit scheduled: yes  Notes to clinic:  Unable to refill per protocol, cannot delegate.      Requested Prescriptions  Pending Prescriptions Disp Refills   topiramate (TOPAMAX) 50 MG tablet [Pharmacy Med Name: Topiramate 50 MG Oral Tablet] 90 tablet 0    Sig: TAKE 3 TABLETS BY MOUTH NIGHTLY     Not Delegated - Neurology: Anticonvulsants - topiramate & zonisamide Failed - 09/12/2021  5:31 AM      Failed - This refill cannot be delegated      Failed - Cr in normal range and within 360 days    Creatinine, Ser  Date Value Ref Range Status  06/28/2021 1.08 (H) 0.44 - 1.00 mg/dL Final          Passed - CO2 in normal range and within 360 days    CO2  Date Value Ref Range Status  06/28/2021 27 22 - 32 mmol/L Final          Passed - Valid encounter within last 12 months    Recent Outpatient Visits           4 months ago Encounter for [employer] annual wellness exam   [facility] Jerrol Banana., MD   10 months ago Primary hypertension   [facility] Jerrol Banana., MD   1 year ago Annual physical exam   [facility] Jerrol Banana., MD   1 year ago Age-related osteoporosis with current pathological fracture with routine healing, subsequent encounter   [facility] Jerrol Banana., MD   2 years ago History of tobacco abuse   [facility] Jerrol Banana., MD       Future Appointments             In 1 month Ralene Bathe, MD [place]   In 2 months Jerrol Banana., MD [facility] [place]

## 2021-09-14 NOTE — Telephone Encounter (Signed)
Pt husband irate as med has not been filled, states will be at the office tomorrow if not at pharmacy by then. FU [id number]

## 2021-09-14 NOTE — Telephone Encounter (Signed)
Please advise refill?  LOV: 08/01/2021 NOV: 11/14/2021 Last refill: 07/18/2021 #90 w/ refill.

## 2021-09-15 ENCOUNTER — Encounter: Payer: Self-pay | Admitting: Family Medicine

## 2021-09-15 ENCOUNTER — Other Ambulatory Visit: Payer: Self-pay | Admitting: Family Medicine

## 2021-09-15 DIAGNOSIS — I700 Atherosclerosis of aorta: Secondary | ICD-10-CM | POA: Insufficient documentation

## 2021-09-15 DIAGNOSIS — G43909 Migraine, unspecified, not intractable, without status migrainosus: Secondary | ICD-10-CM | POA: Insufficient documentation

## 2021-09-15 NOTE — Telephone Encounter (Signed)
Patient has history of migraines. The diagnosis is in her old chart but was never pulled over into epic.

## 2021-09-19 ENCOUNTER — Other Ambulatory Visit: Payer: Self-pay | Admitting: Family Medicine

## 2021-09-23 ENCOUNTER — Ambulatory Visit: Payer: Self-pay | Admitting: *Deleted

## 2021-09-23 NOTE — Telephone Encounter (Signed)
Opened chart to answer a question for the agent.  No contact with pt.

## 2021-09-27 ENCOUNTER — Telehealth: Payer: Self-pay

## 2021-09-27 ENCOUNTER — Other Ambulatory Visit: Payer: Self-pay

## 2021-09-27 MED ORDER — ALPRAZOLAM 0.5 MG PO TABS: 0.5000 mg | ORAL_TABLET | Freq: Two times a day (BID) | ORAL | 3 refills | Status: DC | PRN

## 2021-09-27 NOTE — Telephone Encounter (Signed)
Patient requesting refill. Request sent to Dr. Caryn Section.

## 2021-09-27 NOTE — Telephone Encounter (Signed)
Copied from [place] [id number]. Topic: General - Other >> Sep 23, 2021  1:49 PM Tessa Lerner A wrote: Reason for CRM: The patient's husband would like to be contacted by a member of staff when possible   The patient's husband would like to discuss a previous denial of the patient's ALPRAZolam (XANAX) 0.5 MG tablet [[id number]]  prescription   Please contact further

## 2021-10-10 ENCOUNTER — Other Ambulatory Visit: Payer: Self-pay | Admitting: Physician Assistant

## 2021-10-10 ENCOUNTER — Other Ambulatory Visit: Payer: Self-pay | Admitting: Family Medicine

## 2021-10-11 NOTE — Telephone Encounter (Signed)
Requested medication (s) are due for refill today: yes  Requested medication (s) are on the active medication list: yes  Last refill:  09/15/21  Future visit scheduled: 11/14/21  Notes to clinic:  This medication can not be delegated, please assess.   Requested Prescriptions  Pending Prescriptions Disp Refills   topiramate (TOPAMAX) 50 MG tablet [Pharmacy Med Name: Topiramate 50 MG Oral Tablet] 90 tablet 0    Sig: TAKE 3 TABLETS BY MOUTH NIGHTLY     Not Delegated - Neurology: Anticonvulsants - topiramate & zonisamide Failed - 10/10/2021  6:49 PM      Failed - This refill cannot be delegated      Failed - Cr in normal range and within 360 days    Creatinine, Ser  Date Value Ref Range Status  06/28/2021 1.08 (H) 0.44 - 1.00 mg/dL Final          Passed - CO2 in normal range and within 360 days    CO2  Date Value Ref Range Status  06/28/2021 27 22 - 32 mmol/L Final          Passed - Valid encounter within last 12 months    Recent Outpatient Visits           5 months ago Encounter for [employer] annual wellness exam   [facility] Jerrol Banana., MD   11 months ago Primary hypertension   [facility] Jerrol Banana., MD   1 year ago Annual physical exam   [facility] Jerrol Banana., MD   1 year ago Age-related osteoporosis with current pathological fracture with routine healing, subsequent encounter   [facility] Jerrol Banana., MD   2 years ago History of tobacco abuse   [facility] Jerrol Banana., MD       Future Appointments             In 2 weeks Ralene Bathe, MD [place]   In 1 month Jerrol Banana., MD [facility], [place]

## 2021-10-11 NOTE — Telephone Encounter (Signed)
Requested Prescriptions  Pending Prescriptions Disp Refills   loratadine (CLARITIN) 10 MG tablet [Pharmacy Med Name: Loratadine 10 MG Oral Tablet] 90 tablet 0    Sig: Take 1 tablet by mouth once daily     Ear, Nose, and Throat:  Antihistamines Passed - 10/10/2021  6:49 PM      Passed - Valid encounter within last 12 months    Recent Outpatient Visits          5 months ago Encounter for Medicare annual wellness exam   [facility] Jerrol Banana., MD   11 months ago Primary hypertension   [facility] Jerrol Banana., MD   1 year ago Annual physical exam   [facility] Jerrol Banana., MD   1 year ago Age-related osteoporosis with current pathological fracture with routine healing, subsequent encounter   [facility] Jerrol Banana., MD   2 years ago History of tobacco abuse   [facility] Jerrol Banana., MD      Future Appointments            In 2 weeks Ralene Bathe, MD [place]   In 1 month Jerrol Banana., MD [facility], [place]

## 2021-10-25 ENCOUNTER — Other Ambulatory Visit: Payer: Self-pay

## 2021-10-25 ENCOUNTER — Ambulatory Visit: Payer: Medicare HMO | Admitting: Dermatology

## 2021-10-25 DIAGNOSIS — Z872 Personal history of diseases of the skin and subcutaneous tissue: Secondary | ICD-10-CM

## 2021-10-25 DIAGNOSIS — D1801 Hemangioma of skin and subcutaneous tissue: Secondary | ICD-10-CM

## 2021-10-25 DIAGNOSIS — L578 Other skin changes due to chronic exposure to nonionizing radiation: Secondary | ICD-10-CM | POA: Diagnosis not present

## 2021-10-25 DIAGNOSIS — D2239 Melanocytic nevi of other parts of face: Secondary | ICD-10-CM

## 2021-10-25 DIAGNOSIS — L821 Other seborrheic keratosis: Secondary | ICD-10-CM

## 2021-10-25 DIAGNOSIS — D225 Melanocytic nevi of trunk: Secondary | ICD-10-CM

## 2021-10-25 NOTE — Patient Instructions (Signed)

## 2021-10-25 NOTE — Progress Notes (Signed)
° °  Follow-Up Visit   Subjective  Debbie Dennis is a 76 y.o. female who presents for the following: Follow-up (3 months f/u left nasal bridge AK treated with LN2 ). The patient has spots, moles and lesions to be evaluated, some may be new or changing and the patient has concerns that these could be cancer.  The following portions of the chart were reviewed this encounter and updated as appropriate:   Tobacco   Allergies   Meds   Problems   Med Hx   Surg Hx   Fam Hx      Review of Systems:  No other skin or systemic complaints except as noted in HPI or Assessment and Plan.  Objective  Well appearing patient in no apparent distress; mood and affect are within normal limits.  A focused examination was performed including face,chest. Relevant physical exam findings are noted in the Assessment and Plan.  left nasal bridge Clear    Assessment & Plan  History of actinic keratoses left nasal bridge  Clear. Observe for recurrence. Call clinic for new or changing lesions.  Recommend regular skin exams, daily broad-spectrum spf 30+ sunscreen use, and photoprotection.     Seborrheic Keratoses Face,chest - Stuck-on, waxy, tan-brown papules and/or plaques  - Benign-appearing - Discussed benign etiology and prognosis. - Observe - Call for any changes  Melanocytic Nevi Face,chest  - Tan-brown and/or pink-flesh-colored symmetric macules and papules - Benign appearing on exam today - Observation - Call clinic for new or changing moles - Recommend daily use of broad spectrum spf 30+ sunscreen to sun-exposed areas.    Hemangiomas Back  - Red papules - Discussed benign nature - Observe - Call for any changes   Actinic Damage - chronic, secondary to cumulative UV radiation exposure/sun exposure over time - diffuse scaly erythematous macules with underlying dyspigmentation - Recommend daily broad spectrum sunscreen SPF 30+ to sun-exposed areas, reapply every 2 hours as needed.  -  Recommend staying in the shade or wearing long sleeves, sun glasses (UVA+UVB protection) and wide brim hats (4-inch brim around the entire circumference of the hat). - Call for new or changing lesions.    Return in about 1 year (around 10/25/2022) for hx of Aks .  IMarye Round, CMA, am acting as scribe for Sarina Ser, MD .  Documentation: I have reviewed the above documentation for accuracy and completeness, and I agree with the above.  Sarina Ser, MD

## 2021-10-29 ENCOUNTER — Encounter: Payer: Self-pay | Admitting: Dermatology

## 2021-11-11 NOTE — Progress Notes (Signed)
Established patient visit  I,[name],acting as a scribe for Wilhemena Durie, MD.,have documented all relevant documentation on the behalf of Wilhemena Durie, MD,as directed by  Wilhemena Durie, MD while in the presence of Wilhemena Durie, MD.   Patient: Debbie Dennis   DOB: [birth year]   76 y.o. Female  MRN: [id number] Visit Date: 11/14/2021  Today's healthcare provider: Wilhemena Durie, MD   Chief Complaint  Patient presents with   Follow-up   Hypertension   Hyperlipidemia   Hypothyroidism   Subjective    HPI  Patient comes in today for follow-up.  DM followed by endocrinology.  She states she takes Xanax For restless leg syndrome and is very helpful. Hypertension, follow-up  BP Readings from Last 3 Encounters:  11/14/21 124/73  07/05/21 140/78  05/12/21 116/70   Wt Readings from Last 3 Encounters:  11/14/21 127 lb (57.6 kg)  07/05/21 128 lb 12.8 oz (58.4 kg)  05/12/21 127 lb 9.6 oz (57.9 kg)     She was last seen for hypertension 6 months ago.  BP at that visit was 116/70. Management since that visit includes; on losartan. She reports good compliance with treatment. She is not having side effects. none She is not exercising. She is adherent to low salt diet.   Outside blood pressures are 115/58.  She does not smoke.  Use of agents associated with hypertension: none.   --------------------------------------------------------------------------------------------------- Lipid/Cholesterol, follow-up  Last Lipid Panel: Lab Results  Component Value Date   CHOL 131 05/13/2021   LDLCALC 54 05/13/2021   HDL 70 05/13/2021   TRIG 20 05/13/2021    She was last seen for this 6 months ago.  Management since that visit includes; on rosuvastatin.  She reports good compliance with treatment. She is not having side effects. none  She is following a Regular, Low Sodium diet. Current exercise: none  Last metabolic panel Lab Results  Component  Value Date   GLUCOSE 73 06/28/2021   NA 130 (L) 06/28/2021   K 3.7 06/28/2021   BUN 17 06/28/2021   CREATININE 1.08 (H) 06/28/2021   EGFR 46 (L) 06/14/2021   GFRNONAA 54 (L) 06/28/2021   CALCIUM 9.0 06/28/2021   AST 31 06/28/2021   ALT 17 06/28/2021   The 10-year ASCVD risk score (Arnett DK, et al., 2019) is: 30.2%  --------------------------------------------------------------------------------------------------- Hypothyroid, follow-up  Lab Results  Component Value Date   TSH 3.240 05/13/2021   TSH 2.240 04/27/2020   TSH 1.400 04/25/2019    Wt Readings from Last 3 Encounters:  11/14/21 127 lb (57.6 kg)  07/05/21 128 lb 12.8 oz (58.4 kg)  05/12/21 127 lb 9.6 oz (57.9 kg)    She was last seen for hypothyroid 6 months ago.  Management since that visit includes; on levothyroxine 50 mcg. She reports good compliance with treatment. She is not having side effects. none  -----------------------------------------------------------------------------------------   Medications: Outpatient Medications Prior to Visit  Medication Sig   ALPRAZolam (XANAX) 0.5 MG tablet Take 1 tablet (0.5 mg total) by mouth 2 (two) times daily as needed. for anxiety   Ascorbic Acid (VITAMIN C) 100 MG tablet    CALCIUM PO Take 600 mg by mouth. One in the morning and one at bedtime   Cholecalciferol (VITAMIN D3) 10 MCG (400 UNIT) CAPS    denosumab (PROLIA) 60 MG/ML SOSY injection Inject 60 mg into the skin every 6 (six) months.   fluticasone (FLONASE) 50 MCG/ACT nasal spray Place  2 sprays into both nostrils daily as needed for allergies.    gabapentin (NEURONTIN) 100 MG capsule TAKE 1 CAPSULE BY MOUTH THREE TIMES DAILY   guaiFENesin (MUCINEX) 600 MG 12 hr tablet Take 600 mg by mouth 2 (two) times daily as needed.   GVOKE HYPOPEN 2-PACK 0.5 MG/0.1ML SOAJ    insulin aspart (NOVOLOG) 100 UNIT/ML injection INJECT UP TO 60 UNITS SUBCUTANEOUSLY DAILY PER  SLIDING  SCALE (Patient taking differently: Inject  1-60 Units into the skin See admin instructions. INJECT UP TO 60 UNITS SUBCUTANEOUSLY DAILY PER  SLIDING  SCALE)   Insulin Glargine, 2 Unit Dial, (TOUJEO MAX SOLOSTAR) 300 UNIT/ML SOPN Inject 6 Units into the skin at bedtime.    levothyroxine (EUTHYROX) 50 MCG tablet Take 1 tablet (50 mcg total) by mouth daily before breakfast.   loratadine (CLARITIN) 10 MG tablet Take 1 tablet by mouth once daily   losartan (COZAAR) 25 MG tablet Take 1 tablet by mouth once daily   Menaquinone-7 (VITAMIN K2 PO) Take 1 capsule by mouth daily.   rosuvastatin (CRESTOR) 5 MG tablet Take 1 tablet by mouth once daily   topiramate (TOPAMAX) 50 MG tablet TAKE 3 TABLETS BY MOUTH NIGHTLY   [DISCONTINUED] Cholecalciferol 25 MCG (1000 UT) tablet Take by mouth.   No facility-administered medications prior to visit.    Review of Systems  Constitutional:  Negative for appetite change, chills, fatigue and fever.  Respiratory:  Negative for chest tightness and shortness of breath.   Cardiovascular:  Negative for chest pain and palpitations.  Gastrointestinal:  Negative for abdominal pain, nausea and vomiting.  Neurological:  Negative for dizziness and weakness.       Objective    BP 124/73 (BP Location: Left Arm, Patient Position: Sitting, Cuff Size: Normal)    Pulse 76    Temp 98.1 F (36.7 C) (Temporal)    Resp 16    Ht 5' 1"  (1.549 m)    Wt 127 lb (57.6 kg)    SpO2 98%    BMI 24.00 kg/m  BP Readings from Last 3 Encounters:  11/14/21 124/73  07/05/21 140/78  05/12/21 116/70   Wt Readings from Last 3 Encounters:  11/14/21 127 lb (57.6 kg)  07/05/21 128 lb 12.8 oz (58.4 kg)  05/12/21 127 lb 9.6 oz (57.9 kg)      Physical Exam Vitals reviewed.  Constitutional:      Comments: Cachectic white female in no acute distress.  HENT:     Head: Normocephalic and atraumatic.     Right Ear: External ear normal.     Left Ear: External ear normal.     Nose: Nose normal.  Eyes:     General: No scleral icterus.     Conjunctiva/sclera: Conjunctivae normal.     Pupils: Pupils are equal, round, and reactive to light.  Neck:     Thyroid: No thyromegaly.  Cardiovascular:     Rate and Rhythm: Normal rate and regular rhythm.     Heart sounds: Normal heart sounds.  Pulmonary:     Effort: Pulmonary effort is normal.     Breath sounds: Normal breath sounds.  Abdominal:     Palpations: Abdomen is soft.  Lymphadenopathy:     Cervical: No cervical adenopathy.  Skin:    General: Skin is warm and dry.  Neurological:     General: No focal deficit present.     Mental Status: She is alert and oriented to person, place, and time.  Psychiatric:  Mood and Affect: Mood normal.        Behavior: Behavior normal.        Thought Content: Thought content normal.        Judgment: Judgment normal.      Results for orders placed or performed in visit on 11/14/21  Hemoglobin A1c  Result Value Ref Range   Hemoglobin A1C 6.1     Assessment & Plan     1. Essential hypertension Well-controlled losartan 25  2. Mixed hyperlipidemia On Crestor 5  3. Adult hypothyroidism Euthyroid  4. Type 1 diabetes mellitus with stage 3b chronic kidney disease ([place]) By endocrinology  5. Aortic atherosclerosis ([place]) All risk factors treated  6. Encounter for screening fecal occult blood testing   7. RLS (restless legs syndrome) Refill Xanax .8.  Osteoporosis On Prolia per endocrinology. No follow-ups on file.      I, Wilhemena Durie, MD, have reviewed all documentation for this visit. The documentation on 11/18/21 for the exam, diagnosis, procedures, and orders are all accurate and complete.    Esaul Dorwart Cranford Mon, MD  [facility] [phone] (phone) [phone] (fax)  [place]

## 2021-11-14 ENCOUNTER — Ambulatory Visit (INDEPENDENT_AMBULATORY_CARE_PROVIDER_SITE_OTHER): Payer: Medicare HMO | Admitting: Family Medicine

## 2021-11-14 ENCOUNTER — Other Ambulatory Visit: Payer: Self-pay

## 2021-11-14 ENCOUNTER — Encounter: Payer: Self-pay | Admitting: Family Medicine

## 2021-11-14 VITALS — BP 124/73 | HR 76 | Temp 98.1°F | Resp 16 | Ht 61.0 in | Wt 127.0 lb

## 2021-11-14 DIAGNOSIS — I700 Atherosclerosis of aorta: Secondary | ICD-10-CM | POA: Diagnosis not present

## 2021-11-14 DIAGNOSIS — N1832 Chronic kidney disease, stage 3b: Secondary | ICD-10-CM | POA: Diagnosis not present

## 2021-11-14 DIAGNOSIS — G2581 Restless legs syndrome: Secondary | ICD-10-CM

## 2021-11-14 DIAGNOSIS — E1022 Type 1 diabetes mellitus with diabetic chronic kidney disease: Secondary | ICD-10-CM | POA: Diagnosis not present

## 2021-11-14 DIAGNOSIS — Z1211 Encounter for screening for malignant neoplasm of colon: Secondary | ICD-10-CM

## 2021-11-14 DIAGNOSIS — E039 Hypothyroidism, unspecified: Secondary | ICD-10-CM

## 2021-11-14 DIAGNOSIS — I10 Essential (primary) hypertension: Secondary | ICD-10-CM

## 2021-11-14 DIAGNOSIS — E782 Mixed hyperlipidemia: Secondary | ICD-10-CM

## 2021-11-14 DIAGNOSIS — M8000XD Age-related osteoporosis with current pathological fracture, unspecified site, subsequent encounter for fracture with routine healing: Secondary | ICD-10-CM | POA: Diagnosis not present

## 2021-11-14 MED ORDER — ALPRAZOLAM 0.5 MG PO TABS: 0.5000 mg | ORAL_TABLET | Freq: Two times a day (BID) | ORAL | 5 refills | Status: DC | PRN

## 2021-12-04 ENCOUNTER — Other Ambulatory Visit: Payer: Self-pay | Admitting: Family Medicine

## 2021-12-04 DIAGNOSIS — E782 Mixed hyperlipidemia: Secondary | ICD-10-CM

## 2021-12-20 ENCOUNTER — Other Ambulatory Visit: Payer: Self-pay

## 2021-12-20 DIAGNOSIS — Z87891 Personal history of nicotine dependence: Secondary | ICD-10-CM

## 2021-12-24 DIAGNOSIS — R4020 Unspecified coma: Secondary | ICD-10-CM | POA: Diagnosis not present

## 2021-12-24 DIAGNOSIS — R404 Transient alteration of awareness: Secondary | ICD-10-CM | POA: Diagnosis not present

## 2021-12-24 DIAGNOSIS — R231 Pallor: Secondary | ICD-10-CM | POA: Diagnosis not present

## 2021-12-24 DIAGNOSIS — E162 Hypoglycemia, unspecified: Secondary | ICD-10-CM | POA: Diagnosis not present

## 2021-12-24 DIAGNOSIS — E161 Other hypoglycemia: Secondary | ICD-10-CM | POA: Diagnosis not present

## 2021-12-26 DIAGNOSIS — N1831 Chronic kidney disease, stage 3a: Secondary | ICD-10-CM | POA: Diagnosis not present

## 2021-12-26 DIAGNOSIS — E10649 Type 1 diabetes mellitus with hypoglycemia without coma: Secondary | ICD-10-CM | POA: Diagnosis not present

## 2021-12-26 DIAGNOSIS — E1022 Type 1 diabetes mellitus with diabetic chronic kidney disease: Secondary | ICD-10-CM | POA: Diagnosis not present

## 2022-01-03 ENCOUNTER — Ambulatory Visit
Admission: RE | Admit: 2022-01-03 | Discharge: 2022-01-03 | Disposition: A | Discharge status: Home / Self Care | Payer: Medicare HMO | Source: Ambulatory Visit | Attending: Acute Care | Admitting: Acute Care

## 2022-01-03 DIAGNOSIS — Z87891 Personal history of nicotine dependence: Secondary | ICD-10-CM | POA: Insufficient documentation

## 2022-01-09 ENCOUNTER — Other Ambulatory Visit: Payer: Self-pay | Admitting: Family Medicine

## 2022-01-10 ENCOUNTER — Other Ambulatory Visit: Payer: Self-pay | Admitting: Family Medicine

## 2022-01-11 NOTE — Telephone Encounter (Signed)
Requested Prescriptions  ?Pending Prescriptions Disp Refills  ?? topiramate (TOPAMAX) 50 MG tablet [Pharmacy Med Name: Topiramate 50 MG Oral Tablet] 270 tablet 0  ?  Sig: TAKE 3 TABLETS BY MOUTH NIGHTLY  ?  ? Neurology: Anticonvulsants - topiramate & zonisamide Failed - 01/10/2022 10:04 PM  ?  ?  Failed - Cr in normal range and within 360 days  ?  Creatinine, Ser  ?Date Value Ref Range Status  ?06/28/2021 1.08 (H) 0.44 - 1.00 mg/dL Final  ?   ?  ?  Passed - CO2 in normal range and within 360 days  ?  CO2  ?Date Value Ref Range Status  ?06/28/2021 27 22 - 32 mmol/L Final  ?   ?  ?  Passed - ALT in normal range and within 360 days  ?  ALT  ?Date Value Ref Range Status  ?06/28/2021 17 0 - 44 U/L Final  ?   ?  ?  Passed - AST in normal range and within 360 days  ?  AST  ?Date Value Ref Range Status  ?06/28/2021 31 15 - 41 U/L Final  ?   ?  ?  Passed - Completed PHQ-2 or PHQ-9 in the last 360 days  ?  ?  Passed - Valid encounter within last 12 months  ?  Recent Outpatient Visits   ?      ? 1 month ago Essential hypertension  ? [facility] Jerrol Banana., MD  ? 8 months ago Encounter for [employer] annual wellness exam  ? [facility] Jerrol Banana., MD  ? 1 year ago Primary hypertension  ? [facility] Jerrol Banana., MD  ? 1 year ago Annual physical exam  ? [facility] Jerrol Banana., MD  ? 2 years ago Age-related osteoporosis with current pathological fracture with routine healing, subsequent encounter  ? [facility] Jerrol Banana., MD  ?  ?  ?Future Appointments   ?        ? In 4 months  [employer]  ? In 9 months Ralene Bathe, MD [place]  ?  ? ?  ?  ?  ?\ ?

## 2022-01-16 ENCOUNTER — Other Ambulatory Visit: Payer: Self-pay | Admitting: Acute Care

## 2022-01-16 DIAGNOSIS — Z122 Encounter for screening for malignant neoplasm of respiratory organs: Secondary | ICD-10-CM

## 2022-01-16 DIAGNOSIS — Z87891 Personal history of nicotine dependence: Secondary | ICD-10-CM

## 2022-02-09 DIAGNOSIS — E559 Vitamin D deficiency, unspecified: Secondary | ICD-10-CM | POA: Diagnosis not present

## 2022-02-09 DIAGNOSIS — R5383 Other fatigue: Secondary | ICD-10-CM | POA: Diagnosis not present

## 2022-02-09 DIAGNOSIS — M810 Age-related osteoporosis without current pathological fracture: Secondary | ICD-10-CM | POA: Diagnosis not present

## 2022-02-22 DIAGNOSIS — M810 Age-related osteoporosis without current pathological fracture: Secondary | ICD-10-CM | POA: Diagnosis not present

## 2022-02-22 DIAGNOSIS — E559 Vitamin D deficiency, unspecified: Secondary | ICD-10-CM | POA: Diagnosis not present

## 2022-02-23 ENCOUNTER — Other Ambulatory Visit: Payer: Self-pay | Admitting: Family Medicine

## 2022-02-23 DIAGNOSIS — M5431 Sciatica, right side: Secondary | ICD-10-CM

## 2022-02-23 DIAGNOSIS — M79604 Pain in right leg: Secondary | ICD-10-CM

## 2022-04-11 ENCOUNTER — Other Ambulatory Visit: Payer: Self-pay | Admitting: Family Medicine

## 2022-05-17 ENCOUNTER — Ambulatory Visit (INDEPENDENT_AMBULATORY_CARE_PROVIDER_SITE_OTHER): Payer: Medicare HMO | Admitting: Family Medicine

## 2022-05-17 ENCOUNTER — Encounter: Payer: Self-pay | Admitting: Family Medicine

## 2022-05-17 ENCOUNTER — Ambulatory Visit (INDEPENDENT_AMBULATORY_CARE_PROVIDER_SITE_OTHER): Payer: Medicare HMO

## 2022-05-17 VITALS — BP 140/64 | Ht 61.0 in | Wt 126.7 lb

## 2022-05-17 DIAGNOSIS — E1022 Type 1 diabetes mellitus with diabetic chronic kidney disease: Secondary | ICD-10-CM

## 2022-05-17 DIAGNOSIS — E559 Vitamin D deficiency, unspecified: Secondary | ICD-10-CM

## 2022-05-17 DIAGNOSIS — I10 Essential (primary) hypertension: Secondary | ICD-10-CM | POA: Diagnosis not present

## 2022-05-17 DIAGNOSIS — Z0000 Encounter for general adult medical examination without abnormal findings: Secondary | ICD-10-CM

## 2022-05-17 DIAGNOSIS — Z1231 Encounter for screening mammogram for malignant neoplasm of breast: Secondary | ICD-10-CM | POA: Diagnosis not present

## 2022-05-17 DIAGNOSIS — I700 Atherosclerosis of aorta: Secondary | ICD-10-CM

## 2022-05-17 DIAGNOSIS — E039 Hypothyroidism, unspecified: Secondary | ICD-10-CM

## 2022-05-17 DIAGNOSIS — E782 Mixed hyperlipidemia: Secondary | ICD-10-CM

## 2022-05-17 DIAGNOSIS — N1832 Chronic kidney disease, stage 3b: Secondary | ICD-10-CM | POA: Diagnosis not present

## 2022-05-17 NOTE — Progress Notes (Signed)
Complete physical exam  I,[name],acting as a scribe for Wilhemena Durie, MD.,have documented all relevant documentation on the behalf of Wilhemena Durie, MD,as directed by  Wilhemena Durie, MD while in the presence of Wilhemena Durie, MD.   Patient: Debbie Dennis   DOB: 07-08-1946   76 y.o. Female  MRN: [id number] Visit Date: 05/17/2022  Today's healthcare provider: Wilhemena Durie, MD   Chief Complaint  Patient presents with   Annual Exam   Subjective    Debbie Dennis is a 76 y.o. female who presents today for a complete physical exam.   HPI  Patient had AWV with NHA today at 2:15.  Past Medical History:  Diagnosis Date   Adaptive colitis    Anxiety    Arthritis    Bronchitis    CHRONIC   Buedinger-Ludloff-Laewen disease    Complication of anesthesia    hard to put to sleep-needs more meds   Diabetes mellitus without complication (HCC)    IDDM   GERD (gastroesophageal reflux disease)    Headache    MIGRAINES   Hypertension    Hypothyroidism    Restless leg syndrome    Past Surgical History:  Procedure Laterality Date   ABDOMINAL HYSTERECTOMY     has ovaries-due to abnormal pap smears   BREAST EXCISIONAL BIOPSY Left 1988   BREAST SURGERY     lumpectomy neg 1980's   CATARACT EXTRACTION W/PHACO Left 01/20/2016   Procedure: CATARACT EXTRACTION PHACO AND INTRAOCULAR LENS PLACEMENT ([place]);  Surgeon: Leandrew Koyanagi, MD;  Location: [facility] ORS;  Service: Ophthalmology;  Laterality: Left;  Lot # [id number] H US:02:01:5 AP:20.2% CDE: 24.51   CATARACT EXTRACTION W/PHACO Right 11/28/2016   Procedure: CATARACT EXTRACTION PHACO AND INTRAOCULAR LENS PLACEMENT ([facility]);  Surgeon: Leandrew Koyanagi, MD;  Location: [facility] OR[facility];  Service: Ophthalmology;  Laterality: Right;  [place] 01:01 AP% 18.3 CDE 11.26 Fluid pack lot # [id number]   COLONOSCOPY WITH PROPOFOL N/A 05/14/2015   Procedure: COLONOSCOPY WITH PROPOFOL;  Surgeon: Josefine Class, MD;  Location: [facility]  ENDOSCOPY;  Service: Endoscopy;  Laterality: N/A;   EYE SURGERY     cataract left   ORIF ANKLE FRACTURE Right 10/24/2019   Procedure: OPEN REDUCTION INTERNAL FIXATION (ORIF) TRIMALLEOLAR ANKLE FRACTURE;  Surgeon: Hiram Gash, MD;  Location: [facility];  Service: Orthopedics;  Laterality: Right;   Social History   Socioeconomic History   Marital status: Married    Spouse name: Sonia Side   Number of children: 1   Years of education: 12   Highest education level: 12th grade  Occupational History   Occupation: retired  Tobacco Use   Smoking status: Former    Packs/day: 1.00    Years: 35.00    Total pack years: 35.00    Types: Cigarettes    Quit date: 10/02/2012    Years since quitting: 9.6   Smokeless tobacco: Never  Vaping Use   Vaping Use: Never used  Substance and Sexual Activity   Alcohol use: No   Drug use: No   Sexual activity: Not Currently    Birth control/protection: None, Surgical  Other Topics Concern   Not on file  Social History Narrative   Quit smoking 2014; no alcohol. Worked in [place]; lives in [place].    Social Determinants of Health   Financial Resource Strain: Low Risk  (05/17/2022)   Overall Financial Resource Strain (CARDIA)    Difficulty of Paying Living Expenses: Not hard at  all  Food Insecurity: No Food Insecurity (05/17/2022)   Hunger Vital Sign    Worried About Running Out of Food in the Last Year: Never true    Ran Out of Food in the Last Year: Never true  Transportation Needs: No Transportation Needs (05/17/2022)   PRAPARE - Hydrologist (Medical): No    Lack of Transportation (Non-Medical): No  Physical Activity: Insufficiently Active (05/17/2022)   Exercise Vital Sign    Days of Exercise per Week: 3 days    Minutes of Exercise per Session: 30 min  Stress: No Stress Concern Present (05/17/2022)   [place]    Feeling of Stress :  Not at all  Social Connections: Moderately Isolated (05/17/2022)   Social Connection and Isolation Panel [NHANES]    Frequency of Communication with Friends and Family: More than three times a week    Frequency of Social Gatherings with Friends and Family: More than three times a week    Attends Religious Services: Never    Marine scientist or Organizations: No    Attends Archivist Meetings: Never    Marital Status: Married  Human resources officer Violence: Not At Risk (05/17/2022)   Humiliation, Afraid, Rape, and Kick questionnaire    Fear of Current or Ex-Partner: No    Emotionally Abused: No    Physically Abused: No    Sexually Abused: No   Family Status  Relation Name Status   Mother  Deceased at age 49   Father  Deceased at age 27   Sister  Alive   Brother  Deceased   MGF  Deceased   Son  Alive   MGM  Deceased   [place]  Deceased   PGF  Deceased   Brother  Alive   Family History  Problem Relation Age of Onset   Cancer Mother        kidney cancer- spread into her lungs-died from heart issues    Heart attack Mother    COPD Father    Throat cancer Father    Heart disease Father    Arthritis Sister    Breast cancer Sister 3   Lung cancer Brother    Asthma Maternal Grandfather    Allergies  Allergen Reactions   Codeine Nausea And Vomiting   Misc. Sulfonamide Containing Compounds    Sulfa Antibiotics Rash    Patient Care Team: Jerrol Banana., MD as PCP - General (Family Medicine) Leandrew Koyanagi, MD as Referring Physician (Ophthalmology) Gabriel Carina, Betsey Holiday, MD as Physician Assistant (Endocrinology) Verner Chol, MD as Consulting Physician (Sports Medicine) Hiram Gash, MD as Consulting Physician (Orthopedic Surgery)   Medications: Outpatient Medications Prior to Visit  Medication Sig   ALPRAZolam (XANAX) 0.5 MG tablet Take 1 tablet (0.5 mg total) by mouth 2 (two) times daily as needed. for anxiety   Ascorbic Acid (VITAMIN C) 100 MG  tablet    CALCIUM PO Take 600 mg by mouth. One in the morning and one at bedtime   denosumab (PROLIA) 60 MG/ML SOSY injection Inject 60 mg into the skin every 6 (six) months.   fluticasone (FLONASE) 50 MCG/ACT nasal spray Place 2 sprays into both nostrils daily as needed for allergies.    gabapentin (NEURONTIN) 100 MG capsule TAKE 1 CAPSULE BY MOUTH THREE TIMES DAILY   Glucagon (GVOKE HYPOPEN 2-PACK) 0.5 MG/0.1ML SOAJ Inject into the skin.   GVOKE HYPOPEN 2-PACK 0.5  MG/0.1ML SOAJ    Influenza vac split quadrivalent PF (FLUZONE HIGH-DOSE) 0.5 ML injection    insulin aspart (NOVOLOG) 100 UNIT/ML injection INJECT UP TO 60 UNITS SUBCUTANEOUSLY DAILY PER  SLIDING  SCALE (Patient taking differently: Inject 1-60 Units into the skin See admin instructions. INJECT UP TO 60 UNITS SUBCUTANEOUSLY DAILY PER  SLIDING  SCALE)   insulin glargine (LANTUS) 100 UNIT/ML injection    insulin glargine, 1 Unit Dial, (TOUJEO SOLOSTAR) 300 UNIT/ML Solostar Pen Inject 6 units daily as directed   Insulin Glargine, 2 Unit Dial, (TOUJEO MAX SOLOSTAR) 300 UNIT/ML SOPN Inject 6 Units into the skin at bedtime.    insulin lispro (HUMALOG) 100 UNIT/ML injection    levothyroxine (EUTHYROX) 50 MCG tablet Take 1 tablet (50 mcg total) by mouth daily before breakfast.   loratadine (CLARITIN) 10 MG tablet Take 1 tablet by mouth once daily   losartan (COZAAR) 25 MG tablet Take 1 tablet by mouth once daily   Menaquinone-7 (VITAMIN K2 PO) Take 1 capsule by mouth daily.   NOVOLOG 100 UNIT/ML injection Inject into the skin.   rosuvastatin (CRESTOR) 5 MG tablet Take 1 tablet by mouth once daily   topiramate (TOPAMAX) 50 MG tablet TAKE 3 TABLETS BY MOUTH NIGHTLY   TOUJEO SOLOSTAR 300 UNIT/ML Solostar Pen SMARTSIG:6 Unit(s) SUB-Q Daily   [DISCONTINUED] amoxicillin-clavulanate (AUGMENTIN) 875-125 MG tablet  (Patient not taking: Reported on 05/17/2022)   [DISCONTINUED] Cholecalciferol (VITAMIN D3) 10 MCG (400 UNIT) CAPS  (Patient not taking:  Reported on 05/17/2022)   [DISCONTINUED] doxycycline (ADOXA) 100 MG tablet  (Patient not taking: Reported on 05/17/2022)   [DISCONTINUED] guaiFENesin (MUCINEX) 600 MG 12 hr tablet Take 600 mg by mouth 2 (two) times daily as needed. (Patient not taking: Reported on 05/17/2022)   [DISCONTINUED] meloxicam (MOBIC) 7.5 MG tablet Take 1 tablet every day by oral route with meals. (Patient not taking: Reported on 05/17/2022)   [DISCONTINUED] predniSONE (DELTASONE) 10 MG tablet  (Patient not taking: Reported on 05/17/2022)   [DISCONTINUED] simvastatin (ZOCOR) 10 MG tablet  (Patient not taking: Reported on 05/17/2022)   No facility-administered medications prior to visit.    Review of Systems  Constitutional:  Negative for appetite change, chills, fatigue and fever.  Respiratory:  Negative for chest tightness and shortness of breath.   Cardiovascular:  Negative for chest pain and palpitations.  Gastrointestinal:  Negative for abdominal pain, nausea and vomiting.  Neurological:  Negative for dizziness and weakness.       Objective      Vitals:  BP 140/64 Important   Ht '5\' 1"'$  (1.549 m) Wt 126 lb 11.2 oz (57.5 kg) BMI 23.94 kg/m BSA 1.57 m   BP Readings from Last 3 Encounters:  05/17/22 (!) 140/64  11/14/21 124/73  07/05/21 140/78   Wt Readings from Last 3 Encounters:  05/17/22 126 lb 11.2 oz (57.5 kg)  11/14/21 127 lb (57.6 kg)  07/05/21 128 lb 12.8 oz (58.4 kg)       Physical Exam Vitals reviewed.  Constitutional:      General: She is not in acute distress.    Appearance: She is well-developed.  HENT:     Head: Normocephalic and atraumatic.     Right Ear: External ear normal.     Left Ear: External ear normal.     Nose: Nose normal.  Eyes:     General: No scleral icterus.    Conjunctiva/sclera: Conjunctivae normal.     Pupils: Pupils are equal, round, and reactive to light.  Cardiovascular:     Rate and Rhythm: Normal rate and regular rhythm.     Heart sounds: Normal heart  sounds. No murmur heard.    No friction rub.  Pulmonary:     Effort: Pulmonary effort is normal.     Breath sounds: Normal breath sounds. No wheezing.  Chest:     Chest wall: No tenderness.  Breasts:    Right: No inverted nipple, mass or nipple discharge.     Left: No inverted nipple, mass or nipple discharge.  Abdominal:     General: Bowel sounds are normal. There is no distension.     Palpations: Abdomen is soft.     Tenderness: There is no abdominal tenderness.  Musculoskeletal:        General: No tenderness.     Cervical back: Normal range of motion and neck supple.  Skin:    General: Skin is warm and dry.  Neurological:     General: No focal deficit present.     Mental Status: She is alert and oriented to person, place, and time.  Psychiatric:        Mood and Affect: Mood normal.        Behavior: Behavior normal.        Thought Content: Thought content normal.        Judgment: Judgment normal.       Last depression screening scores    05/17/2022    2:40 PM 11/14/2021    2:23 PM 05/12/2021    2:19 PM  PHQ 2/9 Scores  PHQ - 2 Score 0 0 0  PHQ- 9 Score 0 2 2   Last fall risk screening    05/17/2022    2:44 PM  [place] in the past year? 0  Number falls in past yr: 0  Injury with Fall? 0  Risk for fall due to : No Fall Risks  Follow up Falls evaluation completed   Last Audit-C alcohol use screening    05/17/2022    2:39 PM  Alcohol Use Disorder Test (AUDIT)  1. How often do you have a drink containing alcohol? 0  2. How many drinks containing alcohol do you have on a typical day when you are drinking? 0  3. How often do you have six or more drinks on one occasion? 0  AUDIT-C Score 0   A score of 3 or more in women, and 4 or more in men indicates increased risk for alcohol abuse, EXCEPT if all of the points are from question 1   No results found for any visits on 05/17/22.  Assessment & Plan    Routine Health Maintenance and Physical  Exam  Exercise Activities and Dietary recommendations  Goals      DIET - EAT MORE FRUITS AND VEGETABLES     Recommend to eat 2-3 vegetables in daily diet.      DIET - EAT MORE FRUITS AND VEGETABLES     DIET - INCREASE WATER INTAKE     Recommend increasing water intake to 6-8 8 oz glasses a day.      Prevent falls     Recommend to remove any items from the home that may cause slips or trips.        Immunization History  Administered Date(s) Administered   Fluad Quad(high Dose 65+) 07/15/2019, 06/02/2020   Influenza, High Dose Seasonal PF 07/22/2015, 07/24/2016, 07/19/2017, 06/12/2018   Moderna Sars-Covid-2 Vaccination 11/04/2019, 12/02/2019   Pneumococcal Conjugate-13 08/31/2014  Pneumococcal Polysaccharide-23 07/11/2007, 01/16/2013   Tdap 08/25/2008   Zoster Recombinat (Shingrix) 05/01/2019, 08/06/2019   Zoster, Live 03/02/2012    Health Maintenance  Topic Date Due   TETANUS/TDAP  08/25/2018   COVID-19 Vaccine (3 - Moderna risk series) 12/30/2019   FOOT EXAM  12/11/2020   HEMOGLOBIN A1C  12/15/2021   INFLUENZA VACCINE  05/02/2022   Diabetic kidney evaluation - Urine ACR  06/17/2022   OPHTHALMOLOGY EXAM  06/20/2022   Diabetic kidney evaluation - GFR measurement  06/28/2022   DEXA SCAN  02/04/2023   COLONOSCOPY (Pts 45-51yr Insurance coverage will need to be confirmed)  05/13/2025   Pneumonia Vaccine 75 Years old  Completed   Hepatitis C Screening  Completed   Zoster Vaccines- Shingrix  Completed   HPV VACCINES  Aged Out    Discussed health benefits of physical activity, and encouraged her to engage in regular exercise appropriate for her age and condition.  1. Annual physical exam Patient agrees to get mammogram.  2. Type 1 diabetes mellitus with stage 3b chronic kidney disease ([facility] By endocrinology - Lipid panel - TSH - CBC w/Diff/Platelet - Comprehensive Metabolic Panel (CMET)  3. Aortic atherosclerosis ([place] All risk factors treated.  Patient has  quit smoking - Lipid panel - TSH - CBC w/Diff/Platelet - Comprehensive Metabolic Panel (CMET)  4. Essential hypertension Good control - Lipid panel - TSH - CBC w/Diff/Platelet - Comprehensive Metabolic Panel (CMET)  5. Mixed hyperlipidemia  - Lipid panel - TSH - CBC w/Diff/Platelet - Comprehensive Metabolic Panel (CMET)  6. Adult hypothyroidism  - Lipid panel - TSH - CBC w/Diff/Platelet - Comprehensive Metabolic Panel (CMET)  7. Vitamin D deficiency  - Vitamin D (25 hydroxy)  8. Screening mammogram for breast cancer  - MM 3D SCREEN BREAST BILATERAL   No follow-ups on file.     I, RWilhemena Durie MD, have reviewed all documentation for this visit. The documentation on 05/22/22 for the exam, diagnosis, procedures, and orders are all accurate and complete.    Christon Parada GCranford Mon MD  [phone](phone) [phone](fax)  [place]

## 2022-05-17 NOTE — Patient Instructions (Signed)
[name] , Thank you for taking time to come for your Medicare Wellness Visit. I appreciate your ongoing commitment to your health goals. Please review the following plan we discussed and let me know if I can assist you in the future.   Screening recommendations/referrals: Colonoscopy: aged out Mammogram: aged out Bone Density: 02/03/21 Recommended yearly ophthalmology/optometry visit for glaucoma screening and checkup Recommended yearly dental visit for hygiene and checkup  Vaccinations: Influenza vaccine: n/d Pneumococcal vaccine: 08/31/14 Tdap vaccine: 08/25/08, due if have an injury Shingles vaccine: Zostavax 03/02/12 Shingrix 05/01/19, 08/06/19   Covid-19:11/04/19, 12/02/19   Advanced directives: no  Conditions/risks identified: none  Next appointment: Follow up in one year for your annual wellness visit 05/21/23 @ 1pm in person   Preventive Care 62 Years and Older, Female Preventive care refers to lifestyle choices and visits with your health care provider that can promote health and wellness. What does preventive care include? A yearly physical exam. This is also called an annual well check. Dental exams once or twice a year. Routine eye exams. Ask your health care provider how often you should have your eyes checked. Personal lifestyle choices, including: Daily care of your teeth and gums. Regular physical activity. Eating a healthy diet. Avoiding tobacco and drug use. Limiting alcohol use. Practicing safe sex. Taking low-dose aspirin every day. Taking vitamin and mineral supplements as recommended by your health care provider. What happens during an annual well check? The services and screenings done by your health care provider during your annual well check will depend on your age, overall health, lifestyle risk factors, and family history of disease. Counseling  Your health care provider may ask you questions about your: Alcohol use. Tobacco use. Drug use. Emotional  well-being. Home and relationship well-being. Sexual activity. Eating habits. History of falls. Memory and ability to understand (cognition). Work and work Statistician. Reproductive health. Screening  You may have the following tests or measurements: Height, weight, and BMI. Blood pressure. Lipid and cholesterol levels. These may be checked every 5 years, or more frequently if you are over 50 years old. Skin check. Lung cancer screening. You may have this screening every year starting at age 67 if you have a 30-pack-year history of smoking and currently smoke or have quit within the past 15 years. Fecal occult blood test (FOBT) of the stool. You may have this test every year starting at age 79. Flexible sigmoidoscopy or colonoscopy. You may have a sigmoidoscopy every 5 years or a colonoscopy every 10 years starting at age 23. Hepatitis C blood test. Hepatitis B blood test. Sexually transmitted disease (STD) testing. Diabetes screening. This is done by checking your blood sugar (glucose) after you have not eaten for a while (fasting). You may have this done every 1-3 years. Bone density scan. This is done to screen for osteoporosis. You may have this done starting at age 98. Mammogram. This may be done every 1-2 years. Talk to your health care provider about how often you should have regular mammograms. Talk with your health care provider about your test results, treatment options, and if necessary, the need for more tests. Vaccines  Your health care provider may recommend certain vaccines, such as: Influenza vaccine. This is recommended every year. Tetanus, diphtheria, and acellular pertussis (Tdap, Td) vaccine. You may need a Td booster every 10 years. Zoster vaccine. You may need this after age 46. Pneumococcal 13-valent conjugate (PCV13) vaccine. One dose is recommended after age 59. Pneumococcal polysaccharide (PPSV23) vaccine. One dose  is recommended after age 15. Talk to your  health care provider about which screenings and vaccines you need and how often you need them. This information is not intended to replace advice given to you by your health care provider. Make sure you discuss any questions you have with your health care provider. Document Released: 10/15/2015 Document Revised: 06/07/2016 Document Reviewed: 07/20/2015 Elsevier Interactive Patient Education  2017 [place] Prevention in the Home Falls can cause injuries. They can happen to people of all ages. There are many things you can do to make your home safe and to help prevent falls. What can I do on the outside of my home? Regularly fix the edges of walkways and driveways and fix any cracks. Remove anything that might make you trip as you walk through a door, such as a raised step or threshold. Trim any bushes or trees on the path to your home. Use bright outdoor lighting. Clear any walking paths of anything that might make someone trip, such as rocks or tools. Regularly check to see if handrails are loose or broken. Make sure that both sides of any steps have handrails. Any raised decks and porches should have guardrails on the edges. Have any leaves, snow, or ice cleared regularly. Use sand or salt on walking paths during winter. Clean up any spills in your garage right away. This includes oil or grease spills. What can I do in the bathroom? Use night lights. Install grab bars by the toilet and in the tub and shower. Do not use towel bars as grab bars. Use non-skid mats or decals in the tub or shower. If you need to sit down in the shower, use a plastic, non-slip stool. Keep the floor dry. Clean up any water that spills on the floor as soon as it happens. Remove soap buildup in the tub or shower regularly. Attach bath mats securely with double-sided non-slip rug tape. Do not have throw rugs and other things on the floor that can make you trip. What can I do in the bedroom? Use night  lights. Make sure that you have a light by your bed that is easy to reach. Do not use any sheets or blankets that are too big for your bed. They should not hang down onto the floor. Have a firm chair that has side arms. You can use this for support while you get dressed. Do not have throw rugs and other things on the floor that can make you trip. What can I do in the kitchen? Clean up any spills right away. Avoid walking on wet floors. Keep items that you use a lot in easy-to-reach places. If you need to reach something above you, use a strong step stool that has a grab bar. Keep electrical cords out of the way. Do not use floor polish or wax that makes floors slippery. If you must use wax, use non-skid floor wax. Do not have throw rugs and other things on the floor that can make you trip. What can I do with my stairs? Do not leave any items on the stairs. Make sure that there are handrails on both sides of the stairs and use them. Fix handrails that are broken or loose. Make sure that handrails are as long as the stairways. Check any carpeting to make sure that it is firmly attached to the stairs. Fix any carpet that is loose or worn. Avoid having throw rugs at the top or bottom of the stairs.  If you do have throw rugs, attach them to the floor with carpet tape. Make sure that you have a light switch at the top of the stairs and the bottom of the stairs. If you do not have them, ask someone to add them for you. What else can I do to help prevent falls? Wear shoes that: Do not have high heels. Have rubber bottoms. Are comfortable and fit you well. Are closed at the toe. Do not wear sandals. If you use a stepladder: Make sure that it is fully opened. Do not climb a closed stepladder. Make sure that both sides of the stepladder are locked into place. Ask someone to hold it for you, if possible. Clearly mark and make sure that you can see: Any grab bars or handrails. First and last  steps. Where the edge of each step is. Use tools that help you move around (mobility aids) if they are needed. These include: Canes. Walkers. Scooters. Crutches. Turn on the lights when you go into a dark area. Replace any light bulbs as soon as they burn out. Set up your furniture so you have a clear path. Avoid moving your furniture around. If any of your floors are uneven, fix them. If there are any pets around you, be aware of where they are. Review your medicines with your doctor. Some medicines can make you feel dizzy. This can increase your chance of falling. Ask your doctor what other things that you can do to help prevent falls. This information is not intended to replace advice given to you by your health care provider. Make sure you discuss any questions you have with your health care provider. Document Released: 07/15/2009 Document Revised: 02/24/2016 Document Reviewed: 10/23/2014 Elsevier Interactive Patient Education  2017 [employer].

## 2022-05-17 NOTE — Progress Notes (Signed)
Subjective:   Keaisha Sublette Gethers is a 76 y.o. female who presents for Medicare Annual (Subsequent) preventive examination.  Review of Systems     Cardiac Risk Factors include: advanced age (>88mn, >>61women);diabetes mellitus;dyslipidemia;hypertension     Objective:    Today's Vitals   05/17/22 1432  BP: (!) 140/64  Weight: 126 lb 11.2 oz (57.5 kg)  Height: '5\' 1"'$  (1.549 m)   Body mass index is 23.94 kg/m.     05/17/2022    2:43 PM 07/05/2021    1:52 PM 07/05/2020    2:01 PM 05/11/2020    7:48 AM 05/03/2020    3:38 PM 05/01/2020    2:47 PM 10/24/2019   10:25 AM  Advanced Directives  Does Patient Have a Medical Advance Directive? No No No No No No No  Would patient like information on creating a medical advance directive? No - Patient declined Yes (MAU/Ambulatory/Procedural Areas - Information given) No - Patient declined  No - Patient declined  No - Patient declined    Current Medications (verified) Outpatient Encounter Medications as of 05/17/2022  Medication Sig   ALPRAZolam (XANAX) 0.5 MG tablet Take 1 tablet (0.5 mg total) by mouth 2 (two) times daily as needed. for anxiety   Ascorbic Acid (VITAMIN C) 100 MG tablet    CALCIUM PO Take 600 mg by mouth. One in the morning and one at bedtime   denosumab (PROLIA) 60 MG/ML SOSY injection Inject 60 mg into the skin every 6 (six) months.   fluticasone (FLONASE) 50 MCG/ACT nasal spray Place 2 sprays into both nostrils daily as needed for allergies.    gabapentin (NEURONTIN) 100 MG capsule TAKE 1 CAPSULE BY MOUTH THREE TIMES DAILY   Glucagon (GVOKE HYPOPEN 2-PACK) 0.5 MG/0.1ML SOAJ Inject into the skin.   GVOKE HYPOPEN 2-PACK 0.5 MG/0.1ML SOAJ    Influenza vac split quadrivalent PF (FLUZONE HIGH-DOSE) 0.5 ML injection    insulin aspart (NOVOLOG) 100 UNIT/ML injection INJECT UP TO 60 UNITS SUBCUTANEOUSLY DAILY PER  SLIDING  SCALE (Patient taking differently: Inject 1-60 Units into the skin See admin instructions. INJECT UP TO 60 UNITS  SUBCUTANEOUSLY DAILY PER  SLIDING  SCALE)   insulin glargine (LANTUS) 100 UNIT/ML injection    insulin glargine, 1 Unit Dial, (TOUJEO SOLOSTAR) 300 UNIT/ML Solostar Pen Inject 6 units daily as directed   Insulin Glargine, 2 Unit Dial, (TOUJEO MAX SOLOSTAR) 300 UNIT/ML SOPN Inject 6 Units into the skin at bedtime.    insulin lispro (HUMALOG) 100 UNIT/ML injection    levothyroxine (EUTHYROX) 50 MCG tablet Take 1 tablet (50 mcg total) by mouth daily before breakfast.   loratadine (CLARITIN) 10 MG tablet Take 1 tablet by mouth once daily   losartan (COZAAR) 25 MG tablet Take 1 tablet by mouth once daily   Menaquinone-7 (VITAMIN K2 PO) Take 1 capsule by mouth daily.   NOVOLOG 100 UNIT/ML injection Inject into the skin.   rosuvastatin (CRESTOR) 5 MG tablet Take 1 tablet by mouth once daily   topiramate (TOPAMAX) 50 MG tablet TAKE 3 TABLETS BY MOUTH NIGHTLY   TOUJEO SOLOSTAR 300 UNIT/ML Solostar Pen SMARTSIG:6 Unit(s) SUB-Q Daily   amoxicillin-clavulanate (AUGMENTIN) 875-125 MG tablet  (Patient not taking: Reported on 05/17/2022)   Cholecalciferol (VITAMIN D3) 10 MCG (400 UNIT) CAPS  (Patient not taking: Reported on 05/17/2022)   doxycycline (ADOXA) 100 MG tablet  (Patient not taking: Reported on 05/17/2022)   guaiFENesin (MUCINEX) 600 MG 12 hr tablet Take 600 mg by mouth 2 (  two) times daily as needed. (Patient not taking: Reported on 05/17/2022)   meloxicam (MOBIC) 7.5 MG tablet Take 1 tablet every day by oral route with meals. (Patient not taking: Reported on 05/17/2022)   predniSONE (DELTASONE) 10 MG tablet  (Patient not taking: Reported on 05/17/2022)   simvastatin (ZOCOR) 10 MG tablet  (Patient not taking: Reported on 05/17/2022)   No facility-administered encounter medications on file as of 05/17/2022.    Allergies (verified) Codeine, Misc. sulfonamide containing compounds, and Sulfa antibiotics   History: Past Medical History:  Diagnosis Date   Adaptive colitis    Anxiety    Arthritis     Bronchitis    CHRONIC   Buedinger-Ludloff-Laewen disease    Complication of anesthesia    hard to put to sleep-needs more meds   Diabetes mellitus without complication (HCC)    IDDM   GERD (gastroesophageal reflux disease)    Headache    MIGRAINES   Hypertension    Hypothyroidism    Restless leg syndrome    Past Surgical History:  Procedure Laterality Date   ABDOMINAL HYSTERECTOMY     has ovaries-due to abnormal pap smears   BREAST EXCISIONAL BIOPSY Left 1988   BREAST SURGERY     lumpectomy neg 1980's   CATARACT EXTRACTION W/PHACO Left 01/20/2016   Procedure: CATARACT EXTRACTION PHACO AND INTRAOCULAR LENS PLACEMENT ([place]);  Surgeon: Leandrew Koyanagi, MD;  Location: [facility] ORS;  Service: Ophthalmology;  Laterality: Left;  Lot # [id number] H US:02:01:5 AP:20.2% CDE: 24.51   CATARACT EXTRACTION W/PHACO Right 11/28/2016   Procedure: CATARACT EXTRACTION PHACO AND INTRAOCULAR LENS PLACEMENT ([facility]);  Surgeon: Leandrew Koyanagi, MD;  Location: [facility] OR[facility];  Service: Ophthalmology;  Laterality: Right;  [place] 01:01 AP% 18.3 CDE 11.26 Fluid pack lot # [id number]   COLONOSCOPY WITH PROPOFOL N/A 05/14/2015   Procedure: COLONOSCOPY WITH PROPOFOL;  Surgeon: Josefine Class, MD;  Location: [facility] ENDOSCOPY;  Service: Endoscopy;  Laterality: N/A;   EYE SURGERY     cataract left   ORIF ANKLE FRACTURE Right 10/24/2019   Procedure: OPEN REDUCTION INTERNAL FIXATION (ORIF) TRIMALLEOLAR ANKLE FRACTURE;  Surgeon: Hiram Gash, MD;  Location: [facility];  Service: Orthopedics;  Laterality: Right;   Family History  Problem Relation Age of Onset   Cancer Mother        kidney cancer- spread into her lungs-died from heart issues    Heart attack Mother    COPD Father    Throat cancer Father    Heart disease Father    Arthritis Sister    Breast cancer Sister 79   Lung cancer Brother    Asthma Maternal Grandfather    Social History   Socioeconomic History   Marital status: Married     Spouse name: Sonia Side   Number of children: 1   Years of education: 12   Highest education level: 12th grade  Occupational History   Occupation: retired  Tobacco Use   Smoking status: Former    Packs/day: 1.00    Years: 35.00    Total pack years: 35.00    Types: Cigarettes    Quit date: 10/02/2012    Years since quitting: 9.6   Smokeless tobacco: Never  Vaping Use   Vaping Use: Never used  Substance and Sexual Activity   Alcohol use: No   Drug use: No   Sexual activity: Not Currently    Birth control/protection: None, Surgical  Other Topics Concern   Not on file  Social History Narrative  Quit smoking 2014; no alcohol. Worked in [place]; lives in [place].    Social Determinants of Health   Financial Resource Strain: Low Risk  (05/17/2022)   Overall Financial Resource Strain (CARDIA)    Difficulty of Paying Living Expenses: Not hard at all  Food Insecurity: No Food Insecurity (05/17/2022)   Hunger Vital Sign    Worried About Running Out of Food in the Last Year: Never true    Ran Out of Food in the Last Year: Never true  Transportation Needs: No Transportation Needs (05/17/2022)   PRAPARE - Hydrologist (Medical): No    Lack of Transportation (Non-Medical): No  Physical Activity: Insufficiently Active (05/17/2022)   Exercise Vital Sign    Days of Exercise per Week: 3 days    Minutes of Exercise per Session: 30 min  Stress: No Stress Concern Present (05/17/2022)   [place]    Feeling of Stress : Not at all  Social Connections: Moderately Isolated (05/17/2022)   Social Connection and Isolation Panel [NHANES]    Frequency of Communication with Friends and Family: More than three times a week    Frequency of Social Gatherings with Friends and Family: More than three times a week    Attends Religious Services: Never    Marine scientist or Organizations: No    Attends Programme researcher, broadcasting/film/video: Never    Marital Status: Married    Tobacco Counseling Counseling given: Not Answered   Clinical Intake:  Pre-visit preparation completed: Yes  Pain : No/denies pain     Nutritional Risks: None Diabetes: Yes CBG done?: No Did pt. bring in CBG monitor from home?: No  How often do you need to have someone help you when you read instructions, pamphlets, or other written materials from your doctor or pharmacy?: 1 - Never  Diabetic?yes Nutrition Risk Assessment:  Has the patient had any N/V/D within the last 2 months?  No  Does the patient have any non-healing wounds?  No  Has the patient had any unintentional weight loss or weight gain?  No   Diabetes:  Is the patient diabetic?  Yes  If diabetic, was a CBG obtained today?  No  Did the patient bring in their glucometer from home?  No  How often do you monitor your CBG's? 6 times/day.   Financial Strains and Diabetes Management:  Are you having any financial strains with the device, your supplies or your medication? No .  Does the patient want to be seen by Chronic Care Management for management of their diabetes?  No  Would the patient like to be referred to a Nutritionist or for Diabetic Management?  No   Diabetic Exams:  Diabetic Eye Exam: Completed 06/20/21.  Pt has been advised about the importance in completing this exam.  Diabetic Foot Exam: Completed 12/12/19. Pt has been advised about the importance in completing this exam.   Interpreter Needed?: No  Information entered by :: Kirke Shaggy, LPN   Activities of Daily Living    05/17/2022    2:45 PM 11/14/2021    2:23 PM  In your present state of health, do you have any difficulty performing the following activities:  Hearing? 1 0  Vision? 0 0  Difficulty concentrating or making decisions? 0 0  Walking or climbing stairs? 0 0  Dressing or bathing? 0 0  Doing errands, shopping? 0 0  Preparing Food and eating ?  N   Using the Toilet?  N   In the past six months, have you accidently leaked urine? N   Do you have problems with loss of bowel control? N   Managing your Medications? N   Managing your Finances? N   Housekeeping or managing your Housekeeping? N     Patient Care Team: Jerrol Banana., MD as PCP - General (Family Medicine) Leandrew Koyanagi, MD as Referring Physician (Ophthalmology) Gabriel Carina Betsey Holiday, MD as Physician Assistant (Endocrinology) Verner Chol, MD as Consulting Physician (Sports Medicine) Hiram Gash, MD as Consulting Physician (Orthopedic Surgery)  Indicate any recent Medical Services you may have received from other than [facility] providers in the past year (date may be approximate).     Assessment:   This is a routine wellness examination for Shatia.  Hearing/Vision screen Hearing Screening - Comments:: No aids Vision Screening - Comments:: Readers- [facility]  Dietary issues and exercise activities discussed: Current Exercise Habits: Home exercise routine, Type of exercise: walking, Time (Minutes): 30, Frequency (Times/Week): 3, Weekly Exercise (Minutes/Week): 90, Intensity: Mild   Goals Addressed             This Visit's Progress    DIET - EAT MORE FRUITS AND VEGETABLES         Depression Screen    05/17/2022    2:40 PM 11/14/2021    2:23 PM 05/12/2021    2:19 PM 12/02/2020    2:52 PM 04/26/2020    2:41 PM 04/22/2019    2:14 PM 07/24/2018    2:15 PM  PHQ 2/9 Scores  PHQ - 2 Score 0 0 0 0 3 0 0  PHQ- 9 Score 0 '2 2  10  '$ 0    Fall Risk    05/17/2022    2:44 PM 11/14/2021    2:22 PM 05/12/2021    2:21 PM 12/02/2020    2:34 PM 05/03/2020    3:38 PM  Fall Risk   Falls in the past year? 0 0 '1 1 1  '$ Comment     Tripped  Number falls in past yr: 0 0 0 1 0  Injury with Fall? 0 0 '1 1 1  '$ Comment     Broke left foot and right foot big toe.  Risk for fall due to : No Fall Risks No Fall Risks History of fall(s) History of fall(s) No Fall Risks  Follow up Falls evaluation  completed Falls evaluation completed Falls prevention discussed Falls prevention discussed Falls prevention discussed    FALL RISK PREVENTION PERTAINING TO THE HOME:  Any stairs in or around the home? No  If so, are there any without handrails? No  Home free of loose throw rugs in walkways, pet beds, electrical cords, etc? Yes  Adequate lighting in your home to reduce risk of falls? Yes   ASSISTIVE DEVICES UTILIZED TO PREVENT FALLS:  Life alert? No  Use of a cane, walker or w/c? No  Grab bars in the bathroom? Yes  Shower chair or bench in shower? Yes  Elevated toilet seat or a handicapped toilet? No   TIMED UP AND GO:  Was the test performed? Yes .  Length of time to ambulate 10 feet: 4 sec.   Gait steady and fast without use of assistive device  Cognitive Function:PT A & O Declined test        04/17/2017    2:03 PM  6CIT Screen  What Year? 0 points  What month? 0  points  What time? 0 points  Count back from 20 0 points  Months in reverse 0 points  Repeat phrase 4 points  Total Score 4 points    Immunizations Immunization History  Administered Date(s) Administered   Fluad Quad(high Dose 65+) 07/15/2019, 06/02/2020   Influenza, High Dose Seasonal PF 07/22/2015, 07/24/2016, 07/19/2017, 06/12/2018   Moderna Sars-Covid-2 Vaccination 11/04/2019, 12/02/2019   Pneumococcal Conjugate-13 08/31/2014   Pneumococcal Polysaccharide-23 07/11/2007, 01/16/2013   Tdap 08/25/2008   Zoster Recombinat (Shingrix) 05/01/2019, 08/06/2019   Zoster, Live 03/02/2012    TDAP status: Due, Education has been provided regarding the importance of this vaccine. Advised may receive this vaccine at local pharmacy or Health Dept. Aware to provide a copy of the vaccination record if obtained from local pharmacy or Health Dept. Verbalized acceptance and understanding.  Flu Vaccine status: Declined, Education has been provided regarding the importance of this vaccine but patient still declined.  Advised may receive this vaccine at local pharmacy or Health Dept. Aware to provide a copy of the vaccination record if obtained from local pharmacy or Health Dept. Verbalized acceptance and understanding.  Pneumococcal vaccine status: Up to date  Covid-19 vaccine status: Completed vaccines  Qualifies for Shingles Vaccine? Yes   Zostavax completed Yes   Shingrix Completed?: Yes  Screening Tests Health Maintenance  Topic Date Due   TETANUS/TDAP  08/25/2018   COVID-19 Vaccine (3 - Moderna risk series) 12/30/2019   FOOT EXAM  12/11/2020   HEMOGLOBIN A1C  12/15/2021   INFLUENZA VACCINE  05/02/2022   Diabetic kidney evaluation - Urine ACR  06/17/2022   OPHTHALMOLOGY EXAM  06/20/2022   Diabetic kidney evaluation - GFR measurement  06/28/2022   DEXA SCAN  02/04/2023   COLONOSCOPY (Pts 45-9yr Insurance coverage will need to be confirmed)  05/13/2025   Pneumonia Vaccine 75 Years old  Completed   Hepatitis C Screening  Completed   Zoster Vaccines- Shingrix  Completed   HPV VACCINES  Aged Out    Health Maintenance  Health Maintenance Due  Topic Date Due   TETANUS/TDAP  08/25/2018   COVID-19 Vaccine (3 - Moderna risk series) 12/30/2019   FOOT EXAM  12/11/2020   HEMOGLOBIN A1C  12/15/2021   INFLUENZA VACCINE  05/02/2022   Diabetic kidney evaluation - Urine ACR  06/17/2022    Colorectal cancer screening: No longer required.   Mammogram status: No longer required due to age.  Bone Density status: Completed 02/03/21. Results reflect: Bone density results: OSTEOPENIA. Repeat every 5 years.  Lung Cancer Screening: (Low Dose CT Chest recommended if Age 75-80years, 30 pack-year currently smoking OR have quit w/in 15years.) does not qualify.   Additional Screening:  Hepatitis C Screening: does qualify; Completed 05/10/17  Vision Screening: Recommended annual ophthalmology exams for early detection of glaucoma and other disorders of the eye. Is the patient up to date with their annual  eye exam?  Yes  Who is the provider or what is the name of the office in which the patient attends annual eye exams? [facility]If pt is not established with a provider, would they like to be referred to a provider to establish care? No .   Dental Screening: Recommended annual dental exams for proper oral hygiene  Community Resource Referral / Chronic Care Management: CRR required this visit?  No   CCM required this visit?  No      Plan:     I have personally reviewed and noted the following in the patient's chart:  Medical and social history Use of alcohol, tobacco or illicit drugs  Current medications and supplements including opioid prescriptions.  Functional ability and status Nutritional status Physical activity Advanced directives List of other physicians Hospitalizations, surgeries, and ER visits in previous 12 months Vitals Screenings to include cognitive, depression, and falls Referrals and appointments  In addition, I have reviewed and discussed with patient certain preventive protocols, quality metrics, and best practice recommendations. A written personalized care plan for preventive services as well as general preventive health recommendations were provided to patient.     Dionisio David, LPN   [id number]/[id number]   Nurse Notes: none

## 2022-05-18 ENCOUNTER — Telehealth: Payer: Self-pay

## 2022-05-18 NOTE — Telephone Encounter (Signed)
Please review. Don't see who or any documentation.

## 2022-05-18 NOTE — Telephone Encounter (Signed)
Copied from [place] [phone]. Topic: General - Other >> May 18, 2022  1:22 PM Chapman Fitch wrote: Reason for CRM: Pt missed a call from the nurse / she is not sure if it was the nurse health advisor about her appt or if it was the nurse calling about scheduling her Mammogram / please advise

## 2022-05-22 DIAGNOSIS — E559 Vitamin D deficiency, unspecified: Secondary | ICD-10-CM | POA: Diagnosis not present

## 2022-05-22 DIAGNOSIS — E039 Hypothyroidism, unspecified: Secondary | ICD-10-CM | POA: Diagnosis not present

## 2022-05-22 DIAGNOSIS — E782 Mixed hyperlipidemia: Secondary | ICD-10-CM | POA: Diagnosis not present

## 2022-05-22 DIAGNOSIS — N1832 Chronic kidney disease, stage 3b: Secondary | ICD-10-CM | POA: Diagnosis not present

## 2022-05-22 DIAGNOSIS — E1022 Type 1 diabetes mellitus with diabetic chronic kidney disease: Secondary | ICD-10-CM | POA: Diagnosis not present

## 2022-05-22 DIAGNOSIS — I700 Atherosclerosis of aorta: Secondary | ICD-10-CM | POA: Diagnosis not present

## 2022-05-22 DIAGNOSIS — I10 Essential (primary) hypertension: Secondary | ICD-10-CM | POA: Diagnosis not present

## 2022-05-23 ENCOUNTER — Other Ambulatory Visit: Payer: Self-pay | Admitting: Family Medicine

## 2022-05-23 DIAGNOSIS — M5431 Sciatica, right side: Secondary | ICD-10-CM

## 2022-05-23 DIAGNOSIS — M79604 Pain in right leg: Secondary | ICD-10-CM

## 2022-05-23 LAB — COMPREHENSIVE METABOLIC PANEL
ALT: 12 IU/L (ref 0–32)
AST: 25 IU/L (ref 0–40)
Albumin/Globulin Ratio: 2.3 — ABNORMAL HIGH (ref 1.2–2.2)
Albumin: 4.1 g/dL (ref 3.8–4.8)
Alkaline Phosphatase: 66 IU/L (ref 44–121)
BUN/Creatinine Ratio: 13 (ref 12–28)
BUN: 18 mg/dL (ref 8–27)
Bilirubin Total: 0.3 mg/dL (ref 0.0–1.2)
CO2: 23 mmol/L (ref 20–29)
Calcium: 10.7 mg/dL — ABNORMAL HIGH (ref 8.7–10.3)
Chloride: 105 mmol/L (ref 96–106)
Creatinine, Ser: 1.43 mg/dL — ABNORMAL HIGH (ref 0.57–1.00)
Globulin, Total: 1.8 g/dL (ref 1.5–4.5)
Glucose: 55 mg/dL — ABNORMAL LOW (ref 70–99)
Potassium: 4.9 mmol/L (ref 3.5–5.2)
Sodium: 141 mmol/L (ref 134–144)
Total Protein: 5.9 g/dL — ABNORMAL LOW (ref 6.0–8.5)
eGFR: 38 mL/min/{1.73_m2} — ABNORMAL LOW (ref >59)

## 2022-05-23 LAB — LIPID PANEL
Chol/HDL Ratio: 1.9 ratio (ref 0.0–4.4)
Cholesterol, Total: 137 mg/dL (ref 100–199)
HDL: 74 mg/dL (ref >39)
LDL Chol Calc (NIH): 55 mg/dL (ref 0–99)
Triglycerides: 25 mg/dL (ref 0–149)
VLDL Cholesterol Cal: 8 mg/dL (ref 5–40)

## 2022-05-23 LAB — CBC WITH DIFFERENTIAL/PLATELET
Basophils Absolute: 0.1 10*3/uL (ref 0.0–0.2)
Basos: 1 %
EOS (ABSOLUTE): 0.2 10*3/uL (ref 0.0–0.4)
Eos: 3 %
Hematocrit: 38.9 % (ref 34.0–46.6)
Hemoglobin: 12.7 g/dL (ref 11.1–15.9)
Immature Grans (Abs): 0 10*3/uL (ref 0.0–0.1)
Immature Granulocytes: 0 %
Lymphocytes Absolute: 2.2 10*3/uL (ref 0.7–3.1)
Lymphs: 40 %
MCH: 33.2 pg — ABNORMAL HIGH (ref 26.6–33.0)
MCHC: 32.6 g/dL (ref 31.5–35.7)
MCV: 102 fL — ABNORMAL HIGH (ref 79–97)
Monocytes Absolute: 0.6 10*3/uL (ref 0.1–0.9)
Monocytes: 11 %
Neutrophils Absolute: 2.5 10*3/uL (ref 1.4–7.0)
Neutrophils: 45 %
Platelets: 301 10*3/uL (ref 150–450)
RBC: 3.82 x10E6/uL (ref 3.77–5.28)
RDW: 11.8 % (ref 11.7–15.4)
WBC: 5.6 10*3/uL (ref 3.4–10.8)

## 2022-05-23 LAB — VITAMIN D 25 HYDROXY (VIT D DEFICIENCY, FRACTURES): Vit D, 25-Hydroxy: 83.2 ng/mL (ref 30.0–100.0)

## 2022-05-23 LAB — TSH: TSH: 2.73 u[IU]/mL (ref 0.450–4.500)

## 2022-05-26 ENCOUNTER — Ambulatory Visit: Payer: Self-pay

## 2022-05-26 NOTE — Chronic Care Management (AMB) (Unsigned)
   05/26/2022  Debbie Dennis [birth year] [id number]  Documentation encounter created to complete case transition. The care management team will follow for care coordination as needed.   [facility] [phone]

## 2022-05-31 ENCOUNTER — Ambulatory Visit (INDEPENDENT_AMBULATORY_CARE_PROVIDER_SITE_OTHER): Payer: Medicare HMO | Admitting: Family Medicine

## 2022-05-31 ENCOUNTER — Encounter: Payer: Self-pay | Admitting: Family Medicine

## 2022-05-31 DIAGNOSIS — E1022 Type 1 diabetes mellitus with diabetic chronic kidney disease: Secondary | ICD-10-CM

## 2022-05-31 DIAGNOSIS — N189 Chronic kidney disease, unspecified: Secondary | ICD-10-CM

## 2022-05-31 DIAGNOSIS — M8000XD Age-related osteoporosis with current pathological fracture, unspecified site, subsequent encounter for fracture with routine healing: Secondary | ICD-10-CM

## 2022-05-31 DIAGNOSIS — F419 Anxiety disorder, unspecified: Secondary | ICD-10-CM

## 2022-05-31 DIAGNOSIS — Z79899 Other long term (current) drug therapy: Secondary | ICD-10-CM

## 2022-05-31 DIAGNOSIS — N1832 Chronic kidney disease, stage 3b: Secondary | ICD-10-CM

## 2022-05-31 NOTE — Progress Notes (Signed)
Established patient visit  I,[name],acting as a scribe for Wilhemena Durie, MD.,have documented all relevant documentation on the behalf of Wilhemena Durie, MD,as directed by  Wilhemena Durie, MD while in the presence of Wilhemena Durie, MD.   Patient: Debbie Dennis   DOB: 02-27-1946   76 y.o. Female  MRN: [id number] Visit Date: 05/31/2022  Today's healthcare provider: Wilhemena Durie, MD   Chief Complaint  Patient presents with   Follow-up   Subjective    HPI  Patient is here to discuss her lab results.  She had some hypercalcemia and some mild progressive chronic kidney disease.  Overall she feels pretty well.  Medications: Outpatient Medications Prior to Visit  Medication Sig   ALPRAZolam (XANAX) 0.5 MG tablet Take 1 tablet by mouth twice daily as needed for anxiety   Ascorbic Acid (VITAMIN C) 100 MG tablet    CALCIUM PO Take 600 mg by mouth. One in the morning and one at bedtime   denosumab (PROLIA) 60 MG/ML SOSY injection Inject 60 mg into the skin every 6 (six) months.   fluticasone (FLONASE) 50 MCG/ACT nasal spray Place 2 sprays into both nostrils daily as needed for allergies.    gabapentin (NEURONTIN) 100 MG capsule TAKE 1 CAPSULE BY MOUTH THREE TIMES DAILY   Glucagon (GVOKE HYPOPEN 2-PACK) 0.5 MG/0.1ML SOAJ Inject into the skin.   GVOKE HYPOPEN 2-PACK 0.5 MG/0.1ML SOAJ    Influenza vac split quadrivalent PF (FLUZONE HIGH-DOSE) 0.5 ML injection    insulin aspart (NOVOLOG) 100 UNIT/ML injection INJECT UP TO 60 UNITS SUBCUTANEOUSLY DAILY PER  SLIDING  SCALE (Patient taking differently: Inject 1-60 Units into the skin See admin instructions. INJECT UP TO 60 UNITS SUBCUTANEOUSLY DAILY PER  SLIDING  SCALE)   insulin glargine (LANTUS) 100 UNIT/ML injection    insulin glargine, 1 Unit Dial, (TOUJEO SOLOSTAR) 300 UNIT/ML Solostar Pen Inject 6 units daily as directed   Insulin Glargine, 2 Unit Dial, (TOUJEO MAX SOLOSTAR) 300 UNIT/ML SOPN Inject 6 Units into  the skin at bedtime.    insulin lispro (HUMALOG) 100 UNIT/ML injection    levothyroxine (EUTHYROX) 50 MCG tablet Take 1 tablet (50 mcg total) by mouth daily before breakfast.   loratadine (CLARITIN) 10 MG tablet Take 1 tablet by mouth once daily   losartan (COZAAR) 25 MG tablet Take 1 tablet by mouth once daily   Menaquinone-7 (VITAMIN K2 PO) Take 1 capsule by mouth daily.   NOVOLOG 100 UNIT/ML injection Inject into the skin.   rosuvastatin (CRESTOR) 5 MG tablet Take 1 tablet by mouth once daily   topiramate (TOPAMAX) 50 MG tablet TAKE 3 TABLETS BY MOUTH NIGHTLY   TOUJEO SOLOSTAR 300 UNIT/ML Solostar Pen SMARTSIG:6 Unit(s) SUB-Q Daily   No facility-administered medications prior to visit.    Review of Systems  Constitutional:  Negative for appetite change, chills, fatigue and fever.  Respiratory:  Negative for chest tightness and shortness of breath.   Cardiovascular:  Negative for chest pain and palpitations.  Gastrointestinal:  Negative for abdominal pain, nausea and vomiting.  Neurological:  Negative for dizziness and weakness.    Last hemoglobin A1c Lab Results  Component Value Date   HGBA1C 6.1 06/17/2021       Objective    BP (!) 145/52 (BP Location: Left Arm, Patient Position: Sitting, Cuff Size: Normal)   Pulse 79   Resp 16   SpO2 99%  BP Readings from Last 3 Encounters:  05/31/22 Marland Kitchen)  145/52  05/17/22 (!) 140/64  11/14/21 124/73   Wt Readings from Last 3 Encounters:  05/17/22 126 lb 11.2 oz (57.5 kg)  11/14/21 127 lb (57.6 kg)  07/05/21 128 lb 12.8 oz (58.4 kg)      Physical Exam Vitals reviewed.  Constitutional:      Comments: Cachectic white female in no acute distress.  HENT:     Head: Normocephalic and atraumatic.     Right Ear: External ear normal.     Left Ear: External ear normal.     Nose: Nose normal.  Eyes:     General: No scleral icterus.    Conjunctiva/sclera: Conjunctivae normal.     Pupils: Pupils are equal, round, and reactive to light.   Neck:     Thyroid: No thyromegaly.  Cardiovascular:     Rate and Rhythm: Normal rate and regular rhythm.     Heart sounds: Normal heart sounds.  Pulmonary:     Effort: Pulmonary effort is normal.     Breath sounds: Normal breath sounds.  Abdominal:     Palpations: Abdomen is soft.  Lymphadenopathy:     Cervical: No cervical adenopathy.  Skin:    General: Skin is warm and dry.  Neurological:     General: No focal deficit present.     Mental Status: She is alert and oriented to person, place, and time.  Psychiatric:        Mood and Affect: Mood normal.        Behavior: Behavior normal.        Thought Content: Thought content normal.        Judgment: Judgment normal.       No results found for any visits on 05/31/22.  Assessment & Plan     1. Hypercalcemia Labs ionized calcium and PTH - Parathyroid hormone, intact (no Ca) - Calcium - Calcium, ionized - Renal function panel - B12 - Folate  2. Type 1 diabetes mellitus with stage 3b chronic kidney disease ([place]) Followed by endocrinology - Parathyroid hormone, intact (no Ca) - Calcium - Calcium, ionized - Renal function panel - B12 - Folate  3. High risk medication use  - Parathyroid hormone, intact (no Ca) - Calcium - Calcium, ionized - Renal function panel - B12 - Folate  4. Chronic kidney disease, unspecified CKD stage Avoid nonsteroidals.  Diabetes certainly contributes to this potential issue.  Watch blood pressure closely. - Parathyroid hormone, intact (no Ca) - Calcium - Calcium, ionized - Renal function panel - B12 - Folate  5. Anxiety Chronic but stable  6. Age-related osteoporosis with current pathological fracture with routine healing, subsequent encounter    No follow-ups on file.      I, Wilhemena Durie, MD, have reviewed all documentation for this visit. The documentation on 06/04/22 for the exam, diagnosis, procedures, and orders are all accurate and complete.    Ieshia Hatcher  Cranford Mon, MD  [facility] [phone] (phone) [phone] (fax)  [place]

## 2022-06-01 LAB — RENAL FUNCTION PANEL
Albumin: 4.4 g/dL (ref 3.8–4.8)
BUN/Creatinine Ratio: 13 (ref 12–28)
BUN: 15 mg/dL (ref 8–27)
CO2: 23 mmol/L (ref 20–29)
Calcium: 9.5 mg/dL (ref 8.7–10.3)
Chloride: 105 mmol/L (ref 96–106)
Creatinine, Ser: 1.13 mg/dL — ABNORMAL HIGH (ref 0.57–1.00)
Glucose: 142 mg/dL — ABNORMAL HIGH (ref 70–99)
Phosphorus: 4.3 mg/dL (ref 3.0–4.3)
Potassium: 5.2 mmol/L (ref 3.5–5.2)
Sodium: 142 mmol/L (ref 134–144)
eGFR: 51 mL/min/{1.73_m2} — ABNORMAL LOW (ref >59)

## 2022-06-01 LAB — PARATHYROID HORMONE, INTACT (NO CA): PTH: 20 pg/mL (ref 15–65)

## 2022-06-01 LAB — FOLATE: Folate: >20 ng/mL (ref >3.0)

## 2022-06-01 LAB — VITAMIN B12: Vitamin B-12: 250 pg/mL (ref 232–1245)

## 2022-06-01 LAB — CALCIUM, IONIZED: Calcium, Ion: 5.1 mg/dL (ref 4.5–5.6)

## 2022-06-14 ENCOUNTER — Ambulatory Visit (INDEPENDENT_AMBULATORY_CARE_PROVIDER_SITE_OTHER): Payer: Medicare HMO

## 2022-06-14 DIAGNOSIS — E538 Deficiency of other specified B group vitamins: Secondary | ICD-10-CM

## 2022-06-14 MED ORDER — CYANOCOBALAMIN 1000 MCG/ML IJ SOLN: 1000.0000 ug | INTRAMUSCULAR | 3 refills | Status: AC

## 2022-06-14 MED ORDER — CYANOCOBALAMIN 1000 MCG/ML IJ SOLN
1000.0000 ug | Freq: Once | INTRAMUSCULAR | Status: AC
  Administered 2022-06-14: 1000 ug via INTRAMUSCULAR

## 2022-06-16 ENCOUNTER — Ambulatory Visit
Admission: RE | Admit: 2022-06-16 | Discharge: 2022-06-16 | Disposition: A | Discharge status: Home / Self Care | Payer: Medicare HMO | Source: Ambulatory Visit | Attending: Family Medicine | Admitting: Family Medicine

## 2022-06-16 DIAGNOSIS — Z1231 Encounter for screening mammogram for malignant neoplasm of breast: Secondary | ICD-10-CM | POA: Insufficient documentation

## 2022-06-21 ENCOUNTER — Other Ambulatory Visit: Payer: Self-pay | Admitting: Family Medicine

## 2022-06-22 DIAGNOSIS — E103392 Type 1 diabetes mellitus with moderate nonproliferative diabetic retinopathy without macular edema, left eye: Secondary | ICD-10-CM | POA: Diagnosis not present

## 2022-06-22 DIAGNOSIS — E109 Type 1 diabetes mellitus without complications: Secondary | ICD-10-CM | POA: Diagnosis not present

## 2022-06-29 DIAGNOSIS — E10649 Type 1 diabetes mellitus with hypoglycemia without coma: Secondary | ICD-10-CM | POA: Diagnosis not present

## 2022-06-29 DIAGNOSIS — E1022 Type 1 diabetes mellitus with diabetic chronic kidney disease: Secondary | ICD-10-CM | POA: Diagnosis not present

## 2022-06-29 DIAGNOSIS — E109 Type 1 diabetes mellitus without complications: Secondary | ICD-10-CM | POA: Diagnosis not present

## 2022-06-29 DIAGNOSIS — N1831 Chronic kidney disease, stage 3a: Secondary | ICD-10-CM | POA: Diagnosis not present

## 2022-07-04 ENCOUNTER — Other Ambulatory Visit: Payer: Self-pay

## 2022-07-04 DIAGNOSIS — D472 Monoclonal gammopathy: Secondary | ICD-10-CM

## 2022-07-05 ENCOUNTER — Inpatient Hospital Stay: Payer: Medicare HMO | Attending: Internal Medicine

## 2022-07-05 ENCOUNTER — Inpatient Hospital Stay: Payer: Medicare HMO | Admitting: Internal Medicine

## 2022-07-05 ENCOUNTER — Encounter: Payer: Self-pay | Admitting: Internal Medicine

## 2022-07-05 DIAGNOSIS — K589 Irritable bowel syndrome without diarrhea: Secondary | ICD-10-CM | POA: Diagnosis not present

## 2022-07-05 DIAGNOSIS — K219 Gastro-esophageal reflux disease without esophagitis: Secondary | ICD-10-CM | POA: Insufficient documentation

## 2022-07-05 DIAGNOSIS — N183 Chronic kidney disease, stage 3 unspecified: Secondary | ICD-10-CM | POA: Insufficient documentation

## 2022-07-05 DIAGNOSIS — E039 Hypothyroidism, unspecified: Secondary | ICD-10-CM | POA: Insufficient documentation

## 2022-07-05 DIAGNOSIS — Z8261 Family history of arthritis: Secondary | ICD-10-CM | POA: Insufficient documentation

## 2022-07-05 DIAGNOSIS — Z87891 Personal history of nicotine dependence: Secondary | ICD-10-CM | POA: Diagnosis not present

## 2022-07-05 DIAGNOSIS — I129 Hypertensive chronic kidney disease with stage 1 through stage 4 chronic kidney disease, or unspecified chronic kidney disease: Secondary | ICD-10-CM | POA: Insufficient documentation

## 2022-07-05 DIAGNOSIS — Z794 Long term (current) use of insulin: Secondary | ICD-10-CM | POA: Insufficient documentation

## 2022-07-05 DIAGNOSIS — Z79890 Hormone replacement therapy (postmenopausal): Secondary | ICD-10-CM | POA: Insufficient documentation

## 2022-07-05 DIAGNOSIS — D472 Monoclonal gammopathy: Secondary | ICD-10-CM | POA: Diagnosis not present

## 2022-07-05 DIAGNOSIS — E119 Type 2 diabetes mellitus without complications: Secondary | ICD-10-CM | POA: Diagnosis not present

## 2022-07-05 DIAGNOSIS — M810 Age-related osteoporosis without current pathological fracture: Secondary | ICD-10-CM | POA: Insufficient documentation

## 2022-07-05 DIAGNOSIS — Z79899 Other long term (current) drug therapy: Secondary | ICD-10-CM | POA: Insufficient documentation

## 2022-07-05 LAB — COMPREHENSIVE METABOLIC PANEL
ALT: 14 U/L (ref 0–44)
AST: 28 U/L (ref 15–41)
Albumin: 3.9 g/dL (ref 3.5–5.0)
Alkaline Phosphatase: 60 U/L (ref 38–126)
Anion gap: 7 (ref 5–15)
BUN: 18 mg/dL (ref 8–23)
CO2: 29 mmol/L (ref 22–32)
Calcium: 9.4 mg/dL (ref 8.9–10.3)
Chloride: 99 mmol/L (ref 98–111)
Creatinine, Ser: 1.16 mg/dL — ABNORMAL HIGH (ref 0.44–1.00)
GFR, Estimated: 49 mL/min — ABNORMAL LOW (ref >60)
Glucose, Bld: 122 mg/dL — ABNORMAL HIGH (ref 70–99)
Potassium: 3.8 mmol/L (ref 3.5–5.1)
Sodium: 135 mmol/L (ref 135–145)
Total Bilirubin: 0.7 mg/dL (ref 0.3–1.2)
Total Protein: 6.6 g/dL (ref 6.5–8.1)

## 2022-07-05 LAB — CBC WITH DIFFERENTIAL/PLATELET
Abs Immature Granulocytes: 0.02 10*3/uL (ref 0.00–0.07)
Basophils Absolute: 0 10*3/uL (ref 0.0–0.1)
Basophils Relative: 1 %
Eosinophils Absolute: 0.1 10*3/uL (ref 0.0–0.5)
Eosinophils Relative: 2 %
HCT: 38.1 % (ref 36.0–46.0)
Hemoglobin: 12.8 g/dL (ref 12.0–15.0)
Immature Granulocytes: 0 %
Lymphocytes Relative: 20 %
Lymphs Abs: 1.4 10*3/uL (ref 0.7–4.0)
MCH: 33.3 pg (ref 26.0–34.0)
MCHC: 33.6 g/dL (ref 30.0–36.0)
MCV: 99.2 fL (ref 80.0–100.0)
Monocytes Absolute: 0.6 10*3/uL (ref 0.1–1.0)
Monocytes Relative: 8 %
Neutro Abs: 4.9 10*3/uL (ref 1.7–7.7)
Neutrophils Relative %: 69 %
Platelets: 280 10*3/uL (ref 150–400)
RBC: 3.84 MIL/uL — ABNORMAL LOW (ref 3.87–5.11)
RDW: 13 % (ref 11.5–15.5)
WBC: 7.1 10*3/uL (ref 4.0–10.5)
nRBC: 0 % (ref 0.0–0.2)

## 2022-07-05 NOTE — Progress Notes (Signed)
Pt in for follow up, denies any concerns today. 

## 2022-07-05 NOTE — Assessment & Plan Note (Addendum)
# [  osteoporosis/orthopedics]-recommend further work-up for plasma cell dyscrasia.  2022-Multiple myeloma panel-negative for any monoclonal protein. SEP 2023- Kappa lambda-2.16 [slightly abnormal-likely secondary to CKD].  CBC; no-hypercalcemia.  # # Discussed with patient and husband that unlikely any major concerns for multiple myeloma or plasma cell dyscrasia at this time.  Would not recommend any of the follow-up.  #CKD stage III/diabetes-GFR-49 UNlikely related to multiple myeloma.  Overall stable.  Follow-up with PCP.  #Severe osteoporosis-followed by orthopedics.  #History of smoking-lung cancer screening program.  call- [phone] [facility] number] # DISPOSITION:  # follow up as needed-Dr.B

## 2022-07-06 LAB — KAPPA/LAMBDA LIGHT CHAINS
Kappa free light chain: 33 mg/L — ABNORMAL HIGH (ref 3.3–19.4)
Kappa, lambda light chain ratio: 2.16 — ABNORMAL HIGH (ref 0.26–1.65)
Lambda free light chains: 15.3 mg/L (ref 5.7–26.3)

## 2022-07-07 NOTE — Progress Notes (Signed)
[place] NOTE  Patient Care Team: Jerrol Banana., MD as PCP - General (Family Medicine) Leandrew Koyanagi, MD as Referring Physician (Ophthalmology) Gabriel Carina, Betsey Holiday, MD as Physician Assistant (Endocrinology) Verner Chol, MD as Consulting Physician (Sports Medicine) Hiram Gash, MD as Consulting Physician (Orthopedic Surgery)  CHIEF COMPLAINTS/PURPOSE OF CONSULTATION: Possible plasma cell dyscrasia  #Hypogammaglobinemia [sep 2021]-incidental; hemoglobin 11.4 [normal]; calcium 10.0; platelets normal white count normal.   #Severe osteoporosis; diabetes type 2 on insulin; history of smoking Oncology History   No history exists.     HISTORY OF PRESENTING ILLNESS: Ambulating independently.  Accompanied by husband. Debbie Dennis 76 y.o.  female prior history of smoking and history of severe osteoporosis /is here for possible plasma cell dyscrasia follow-up.  Patient denies any worsening bone pain or joint pains.  Denies any nausea vomiting abdominal pain.  Denies any shortness of breath or cough. Denies any tingling or numbness.     Review of Systems  Constitutional:  Negative for chills, diaphoresis, fever, malaise/fatigue and weight loss.  HENT:  Negative for nosebleeds and sore throat.   Eyes:  Negative for double vision.  Respiratory:  Negative for cough, hemoptysis, sputum production, shortness of breath and wheezing.   Cardiovascular:  Negative for chest pain, palpitations, orthopnea and leg swelling.  Gastrointestinal:  Negative for abdominal pain, blood in stool, constipation, diarrhea, heartburn, melena, nausea and vomiting.  Genitourinary:  Negative for dysuria, frequency and urgency.  Musculoskeletal:  Positive for back pain and joint pain.  Skin: Negative.  Negative for itching and rash.  Neurological:  Negative for dizziness, tingling, focal weakness, weakness and headaches.  Endo/Heme/Allergies:  Does not bruise/bleed easily.   Psychiatric/Behavioral:  Negative for depression. The patient is not nervous/anxious and does not have insomnia.      MEDICAL HISTORY:  Past Medical History:  Diagnosis Date   Adaptive colitis    Anxiety    Arthritis    Bronchitis    CHRONIC   Buedinger-Ludloff-Laewen disease    Complication of anesthesia    hard to put to sleep-needs more meds   Diabetes mellitus without complication (HCC)    IDDM   GERD (gastroesophageal reflux disease)    Headache    MIGRAINES   Hypertension    Hypothyroidism    Restless leg syndrome     SURGICAL HISTORY: Past Surgical History:  Procedure Laterality Date   ABDOMINAL HYSTERECTOMY     has ovaries-due to abnormal pap smears   BREAST EXCISIONAL BIOPSY Left 1988   BREAST SURGERY     lumpectomy neg 1980's   CATARACT EXTRACTION W/PHACO Left 01/20/2016   Procedure: CATARACT EXTRACTION PHACO AND INTRAOCULAR LENS PLACEMENT ([place]);  Surgeon: Leandrew Koyanagi, MD;  Location: [facility] ORS;  Service: Ophthalmology;  Laterality: Left;  Lot # [id number] H US:02:01:5 AP:20.2% CDE: 24.51   CATARACT EXTRACTION W/PHACO Right 11/28/2016   Procedure: CATARACT EXTRACTION PHACO AND INTRAOCULAR LENS PLACEMENT ([facility]);  Surgeon: Leandrew Koyanagi, MD;  Location: [facility] ORS;  Service: Ophthalmology;  Laterality: Right;  [place] 01:01 AP% 18.3 CDE 11.26 Fluid pack lot # [id number]   COLONOSCOPY WITH PROPOFOL N/A 05/14/2015   Procedure: COLONOSCOPY WITH PROPOFOL;  Surgeon: Josefine Class, MD;  [facility]: [facility] END[facility];  Service: Endoscopy;  Laterality: N/A;   EYE SURGERY     cataract left   ORIF ANKLE FRACTURE Right 10/24/2019   Procedure: OPEN REDUCTION INTERNAL FIXATION (ORIF) TRIMALLEOLAR ANKLE FRACTURE;  Surgeon: Hiram Gash, MD;  Location: [facility];  Service: Orthopedics;  Laterality: Right;    SOCIAL HISTORY: Social History   Socioeconomic History   Marital status: Married    Spouse name: Debbie Dennis   Number of children: 1   Years of  education: 12   Highest education level: 12th grade  Occupational History   Occupation: retired  Tobacco Use   Smoking status: Former    Packs/day: 1.00    Years: 35.00    Total pack years: 35.00    Types: Cigarettes    Quit date: 10/02/2012    Years since quitting: 9.7   Smokeless tobacco: Never  Vaping Use   Vaping Use: Never used  Substance and Sexual Activity   Alcohol use: No   Drug use: No   Sexual activity: Not Currently    Birth control/protection: None, Surgical  Other Topics Concern   Not on file  Social History Narrative   Quit smoking 2014; no alcohol. Worked in [place]; lives in [place].    Social Determinants of Health   Financial Resource Strain: Low Risk  (05/17/2022)   Overall Financial Resource Strain (CARDIA)    Difficulty of Paying Living Expenses: Not hard at all  Food Insecurity: No Food Insecurity (05/17/2022)   Hunger Vital Sign    Worried About Running Out of Food in the Last Year: Never true    Ran Out of Food in the Last Year: Never true  Transportation Needs: No Transportation Needs (05/17/2022)   PRAPARE - Hydrologist (Medical): No    Lack of Transportation (Non-Medical): No  Physical Activity: Insufficiently Active (05/17/2022)   Exercise Vital Sign    Days of Exercise per Week: 3 days    Minutes of Exercise per Session: 30 min  Stress: No Stress Concern Present (05/17/2022)   [place]    Feeling of Stress : Not at all  Social Connections: Moderately Isolated (05/17/2022)   Social Connection and Isolation Panel [NHANES]    Frequency of Communication with Friends and Family: More than three times a week    Frequency of Social Gatherings with Friends and Family: More than three times a week    Attends Religious Services: Never    Marine scientist or Organizations: No    Attends Archivist Meetings: Never    Marital Status:  Married  Human resources officer Violence: Not At Risk (05/17/2022)   Humiliation, Afraid, Rape, and Kick questionnaire    Fear of Current or Ex-Partner: No    Emotionally Abused: No    Physically Abused: No    Sexually Abused: No    FAMILY HISTORY: Family History  Problem Relation Age of Onset   Cancer Mother        kidney cancer- spread into her lungs-died from heart issues    Heart attack Mother    COPD Father    Throat cancer Father    Heart disease Father    Arthritis Sister    Breast cancer Sister 19   Lung cancer Brother    Asthma Maternal Grandfather     ALLERGIES:  is allergic to codeine, misc. sulfonamide containing compounds, and sulfa antibiotics.  MEDICATIONS:  Current Outpatient Medications  Medication Sig Dispense Refill   ALPRAZolam (XANAX) 0.5 MG tablet Take 1 tablet by mouth twice daily as needed for anxiety 60 tablet 3   Ascorbic Acid (VITAMIN C) 100 MG tablet      CALCIUM PO Take 600 mg by mouth.  One in the morning and one at bedtime     cyanocobalamin (VITAMIN B12) 1000 MCG/ML injection Inject 1 mL (1,000 mcg total) into the skin every 30 (thirty) days. 3 mL 3   cyanocobalamin (VITAMIN B12) 1000 MCG/ML injection Inject into the skin.     denosumab (PROLIA) 60 MG/ML SOSY injection Inject 60 mg into the skin every 6 (six) months.     fluticasone (FLONASE) 50 MCG/ACT nasal spray Place 2 sprays into both nostrils daily as needed for allergies.      gabapentin (NEURONTIN) 100 MG capsule TAKE 1 CAPSULE BY MOUTH THREE TIMES DAILY 270 capsule 0   Glucagon (GVOKE HYPOPEN 2-PACK) 0.5 MG/0.1ML SOAJ Inject into the skin.     insulin aspart (NOVOLOG) 100 UNIT/ML injection INJECT UP TO 60 UNITS SUBCUTANEOUSLY DAILY PER  SLIDING  SCALE (Patient taking differently: Inject 1-60 Units into the skin See admin instructions. INJECT UP TO 60 UNITS SUBCUTANEOUSLY DAILY PER  SLIDING  SCALE) 90 mL 3   levothyroxine (EUTHYROX) 50 MCG tablet Take 1 tablet (50 mcg total) by mouth daily before  breakfast. 30 tablet 12   loratadine (CLARITIN) 10 MG tablet Take 1 tablet by mouth once daily 90 tablet 3   losartan (COZAAR) 25 MG tablet Take 1 tablet by mouth once daily 90 tablet 3   Menaquinone-7 (VITAMIN K2 PO) Take 1 capsule by mouth daily.     rosuvastatin (CRESTOR) 5 MG tablet Take 1 tablet by mouth once daily 90 tablet 3   topiramate (TOPAMAX) 50 MG tablet TAKE 3 TABLETS BY MOUTH NIGHTLY 270 tablet 1   TOUJEO SOLOSTAR 300 UNIT/ML Solostar Pen SMARTSIG:6 Unit(s) SUB-Q Daily     No current facility-administered medications for this visit.      Marland Kitchen  PHYSICAL EXAMINATION: ECOG PERFORMANCE STATUS: 1 - Symptomatic but completely ambulatory  Vitals:   07/05/22 1404  BP: 131/81  Pulse: 75  Resp: 16  Temp: (!) 96.1 F (35.6 C)  SpO2: 100%   Filed Weights   07/05/22 1404  Weight: 126 lb (57.2 kg)    Physical Exam HENT:     Head: Normocephalic and atraumatic.     Mouth/Throat:     Pharynx: No oropharyngeal exudate.  Eyes:     Pupils: Pupils are equal, round, and reactive to light.  Cardiovascular:     Rate and Rhythm: Normal rate and regular rhythm.  Pulmonary:     Effort: No respiratory distress.     Breath sounds: No wheezing.     Comments: Decreased air entry bilaterally. Abdominal:     General: Bowel sounds are normal. There is no distension.     Palpations: Abdomen is soft. There is no mass.     Tenderness: There is no abdominal tenderness. There is no guarding or rebound.  Musculoskeletal:        General: No tenderness. Normal range of motion.     Cervical back: Normal range of motion and neck supple.  Skin:    General: Skin is warm.  Neurological:     Mental Status: She is alert and oriented to person, place, and time.  Psychiatric:        Mood and Affect: Affect normal.      LABORATORY DATA:  I have reviewed the data as listed Lab Results  Component Value Date   WBC 7.1 07/05/2022   HGB 12.8 07/05/2022   HCT 38.1 07/05/2022   MCV 99.2  07/05/2022   PLT 280 07/05/2022   Recent Labs  05/22/22 0856 05/31/22 1515 07/05/22 1327  NA 141 142 135  K 4.9 5.2 3.8  CL 105 105 99  CO2 23 23 29   GLUCOSE 55* 142* 122*  BUN 18 15 18   CREATININE 1.43* 1.13* 1.16*  CALCIUM 10.7* 9.5 9.4  GFRNONAA  --   --  49*  PROT 5.9*  --  6.6  ALBUMIN 4.1 4.4 3.9  AST 25  --  28  ALT 12  --  14  ALKPHOS 66  --  60  BILITOT 0.3  --  0.7    RADIOGRAPHIC STUDIES: I have personally reviewed the radiological images as listed and agreed with the findings in the report. MM 3D SCREEN BREAST BILATERAL  Result Date: 06/19/2022 CLINICAL DATA:  Screening. EXAM: DIGITAL SCREENING BILATERAL MAMMOGRAM WITH TOMOSYNTHESIS AND CAD TECHNIQUE: Bilateral screening digital craniocaudal and mediolateral oblique mammograms were obtained. Bilateral screening digital breast tomosynthesis was performed. The images were evaluated with computer-aided detection. COMPARISON:  Previous exam(s). ACR Breast Density Category c: The breast tissue is heterogeneously dense, which may obscure small masses. FINDINGS: There are no findings suspicious for malignancy. IMPRESSION: No mammographic evidence of malignancy. A result letter of this screening mammogram will be mailed directly to the patient. RECOMMENDATION: Screening mammogram in one year. (Code:SM-B-01Y) BI-RADS CATEGORY  1: Negative. Electronically Signed   By: Abelardo Diesel M.D.   On: 06/19/2022 15:02    ASSESSMENT & PLAN:   Monoclonal gammopathy # [osteoporosis/orthopedics]-recommend further work-up for plasma cell dyscrasia.  2022-Multiple myeloma panel-negative for any monoclonal protein. SEP 2023- Kappa lambda-2.16 [slightly abnormal-likely secondary to CKD].  CBC; no-hypercalcemia.  # # Discussed with patient and husband that unlikely any major concerns for multiple myeloma or plasma cell dyscrasia at this time.  Would not recommend any of the follow-up.  #CKD stage III/diabetes-GFR-49 UNlikely related to  multiple myeloma.  Overall stable.  Follow-up with PCP.  #Severe osteoporosis-followed by orthopedics.  #History of smoking-lung cancer screening program.  call- [phone] [facility] number] # DISPOSITION:  # follow up as needed-Dr.B  All questions were answered. The patient knows to call the clinic with any problems, questions or concerns.    Cammie Sickle, MD 07/07/2022 12:47 PM

## 2022-07-10 LAB — MULTIPLE MYELOMA PANEL, SERUM
Albumin SerPl Elph-Mcnc: 3.5 g/dL (ref 2.9–4.4)
Albumin/Glob SerPl: 1.5 (ref 0.7–1.7)
Alpha 1: 0.2 g/dL (ref 0.0–0.4)
Alpha2 Glob SerPl Elph-Mcnc: 0.7 g/dL (ref 0.4–1.0)
B-Globulin SerPl Elph-Mcnc: 0.8 g/dL (ref 0.7–1.3)
Gamma Glob SerPl Elph-Mcnc: 0.8 g/dL (ref 0.4–1.8)
Globulin, Total: 2.5 g/dL (ref 2.2–3.9)
IgA: 77 mg/dL (ref 64–422)
IgG (Immunoglobin G), Serum: 679 mg/dL (ref 586–1602)
IgM (Immunoglobulin M), Srm: 180 mg/dL (ref 26–217)
Total Protein ELP: 6 g/dL (ref 6.0–8.5)

## 2022-08-03 ENCOUNTER — Telehealth: Payer: Self-pay | Admitting: Family Medicine

## 2022-08-03 ENCOUNTER — Other Ambulatory Visit: Payer: Self-pay

## 2022-08-03 MED ORDER — LORATADINE 10 MG PO TABS: 10.0000 mg | ORAL_TABLET | Freq: Every day | ORAL | 0 refills | Status: AC

## 2022-08-03 NOTE — Telephone Encounter (Signed)
[facility] faxed refill request for the following medications:    losartan (COZAAR) 25 MG tablet    Please advise

## 2022-08-09 ENCOUNTER — Telehealth: Payer: Self-pay | Admitting: Family Medicine

## 2022-08-09 ENCOUNTER — Other Ambulatory Visit: Payer: Self-pay | Admitting: *Deleted

## 2022-08-09 ENCOUNTER — Other Ambulatory Visit: Payer: Self-pay

## 2022-08-09 DIAGNOSIS — E10649 Type 1 diabetes mellitus with hypoglycemia without coma: Secondary | ICD-10-CM | POA: Diagnosis not present

## 2022-08-09 MED ORDER — LOSARTAN POTASSIUM 25 MG PO TABS: 25.0000 mg | ORAL_TABLET | Freq: Every day | ORAL | 0 refills | Status: DC

## 2022-08-09 NOTE — Telephone Encounter (Signed)
Also attempted to reach husband on mobile, appears he answered then hung up or call dropped.

## 2022-08-09 NOTE — Telephone Encounter (Signed)
Requested medication (s) are due for refill today: yes  Requested medication (s) are on the active medication list: yes    Last refill: 08/09/21  #90 3 refills  Future visit scheduled no  Notes to clinic:Pt's husband at pharmacy to pick up refill, states cannot drive back to pharmacy. States request faxed 08/03/22. Pt of Dr. Marlan Palau, needs to establish care for refills. Attempted to speak to husband, hung up. Attempted to reach pt at home #, "Not accepting messages at this time."  Called during practice lunch break. Please advise.   Thank you.  Requested Prescriptions  Pending Prescriptions Disp Refills   losartan (COZAAR) 25 MG tablet 90 tablet 3    Sig: Take 1 tablet (25 mg total) by mouth daily.     Cardiovascular:  Angiotensin Receptor Blockers Failed - 08/09/2022 12:38 PM      Failed - Cr in normal range and within 180 days    Creatinine, Ser  Date Value Ref Range Status  07/05/2022 1.16 (H) 0.44 - 1.00 mg/dL Final         Passed - K in normal range and within 180 days    Potassium  Date Value Ref Range Status  07/05/2022 3.8 3.5 - 5.1 mmol/L Final         Passed - Patient is not pregnant      Passed - Last BP in normal range    BP Readings from Last 1 Encounters:  07/05/22 131/81         Passed - Valid encounter within last 6 months    Recent Outpatient Visits           2 months ago Hypercalcemia   [facility] Jerrol Banana., MD   2 months ago Annual physical exam   [facility] Jerrol Banana., MD   8 months ago Essential hypertension   [facility] Jerrol Banana., MD   1 year ago Encounter for [employer] annual wellness exam   [facility] Jerrol Banana., MD   1 year ago Primary hypertension   [facility] Jerrol Banana., MD       Future Appointments             In 2 months Ralene Bathe, MD [place]

## 2022-08-09 NOTE — Telephone Encounter (Unsigned)
Copied from [place] [phone]. Topic: General - Other >> Aug 09, 2022 10:00 AM Everette C wrote: Reason for CRM: Medication Refill - Medication: losartan (COZAAR) 25 MG tablet [[id number]]  Has the patient contacted their pharmacy? Yes.  The patient has been directed to contact their PCP  (Agent: If no, request that the patient contact the pharmacy for the refill. If patient does not wish to contact the pharmacy document the reason why and proceed with request.) (Agent: If yes, when and what did the pharmacy advise?)  Preferred Pharmacy (with phone number or street name): [place], [place] - [address] [place] [address] Phone: [phone] Fax: [phone] Hours: Not open 24 hours   Has the patient been seen for an appointment in the last year OR does the patient have an upcoming appointment? Yes.    Agent: Please be advised that RX refills may take up to 3 business days. We ask that you follow-up with your pharmacy.

## 2022-08-22 DIAGNOSIS — E10649 Type 1 diabetes mellitus with hypoglycemia without coma: Secondary | ICD-10-CM | POA: Diagnosis not present

## 2022-08-30 DIAGNOSIS — M810 Age-related osteoporosis without current pathological fracture: Secondary | ICD-10-CM | POA: Diagnosis not present

## 2022-08-31 ENCOUNTER — Telehealth: Payer: Self-pay | Admitting: Family Medicine

## 2022-08-31 ENCOUNTER — Other Ambulatory Visit: Payer: Self-pay

## 2022-08-31 DIAGNOSIS — E039 Hypothyroidism, unspecified: Secondary | ICD-10-CM

## 2022-08-31 MED ORDER — LEVOTHYROXINE SODIUM 50 MCG PO TABS: 50.0000 ug | ORAL_TABLET | Freq: Every day | ORAL | 12 refills | Status: AC

## 2022-08-31 NOTE — Telephone Encounter (Signed)
[facility] faxed refill request for the following medications:   levothyroxine (EUTHYROX) 50 MCG tablet     Please advise.

## 2022-09-01 ENCOUNTER — Telehealth: Payer: Self-pay | Admitting: Family Medicine

## 2022-09-01 DIAGNOSIS — M5431 Sciatica, right side: Secondary | ICD-10-CM

## 2022-09-01 DIAGNOSIS — M79604 Pain in right leg: Secondary | ICD-10-CM

## 2022-09-01 MED ORDER — GABAPENTIN 100 MG PO CAPS: 100.0000 mg | ORAL_CAPSULE | Freq: Three times a day (TID) | ORAL | 0 refills | Status: DC

## 2022-09-01 NOTE — Telephone Encounter (Signed)
Script was sent yesterday. Patient notified.

## 2022-09-01 NOTE — Telephone Encounter (Signed)
[facility] faxed refill request for the following medications:   gabapentin (NEURONTIN) 100 MG capsule    Please advise.

## 2022-09-01 NOTE — Telephone Encounter (Signed)
Patient has appointment scheduled with you February 27,2024.

## 2022-09-01 NOTE — Telephone Encounter (Signed)
Husband states they called in pt's  gabapentin (NEURONTIN) 100 MG capsule  With the in w/ the levothyroxine.(Around 11/23)  Pt is out and needs today.  Pt has made appt w/ Dr Quentin Cornwall.   [place], [place]

## 2022-09-26 ENCOUNTER — Other Ambulatory Visit: Payer: Self-pay | Admitting: Family Medicine

## 2022-09-26 DIAGNOSIS — M79604 Pain in right leg: Secondary | ICD-10-CM

## 2022-09-26 DIAGNOSIS — M5431 Sciatica, right side: Secondary | ICD-10-CM

## 2022-09-27 NOTE — Telephone Encounter (Signed)
Next appointment scheduled with you is 11/28/2022 at 3:00 PM

## 2022-10-13 ENCOUNTER — Other Ambulatory Visit: Payer: Self-pay | Admitting: Family Medicine

## 2022-10-13 MED ORDER — TOPIRAMATE 50 MG PO TABS: ORAL_TABLET | ORAL | 0 refills | Status: AC

## 2022-10-13 NOTE — Telephone Encounter (Signed)
Requested Prescriptions  Pending Prescriptions Disp Refills   topiramate (TOPAMAX) 50 MG tablet 270 tablet 0    Sig: TAKE 3 TABLETS BY MOUTH NIGHTLY     Neurology: Anticonvulsants - topiramate & zonisamide Failed - 10/13/2022 10:44 AM      Failed - Cr in normal range and within 360 days    Creatinine, Ser  Date Value Ref Range Status  07/05/2022 1.16 (H) 0.44 - 1.00 mg/dL Final         Passed - CO2 in normal range and within 360 days    CO2  Date Value Ref Range Status  07/05/2022 29 22 - 32 mmol/L Final         Passed - ALT in normal range and within 360 days    ALT  Date Value Ref Range Status  07/05/2022 14 0 - 44 U/L Final         Passed - AST in normal range and within 360 days    AST  Date Value Ref Range Status  07/05/2022 28 15 - 41 U/L Final         Passed - Completed PHQ-2 or PHQ-9 in the last 360 days      Passed - Valid encounter within last 12 months    Recent Outpatient Visits           4 months ago Hypercalcemia   [facility] Jerrol Banana., MD   4 months ago Annual physical exam   [facility] Jerrol Banana., MD   11 months ago Essential hypertension   [facility] Jerrol Banana., MD   1 year ago Encounter for [employer] annual wellness exam   [facility] Jerrol Banana., MD   1 year ago Primary hypertension   [facility] Jerrol Banana., MD       Future Appointments             In 2 weeks Ralene Bathe, MD [place]   In 1 month Simmons-Robinson, Riki Sheer, MD [facility] [place]

## 2022-10-13 NOTE — Telephone Encounter (Signed)
Medication Refill - Medication: topiramate (TOPAMAX) 50 MG tablet   The patient is almost completely out   Has the patient contacted their pharmacy? Yes.   (Agent: If no, request that the patient contact the pharmacy for the refill. If patient does not wish to contact the pharmacy document the reason why and proceed with request.) (Agent: If yes, when and what did the pharmacy advise?)  Preferred Pharmacy (with phone number or street name):   [place], [place] - [address] [place] [address]  Phone: [phone] Fax: [phone]   Has the patient been seen for an appointment in the last year OR does the patient have an upcoming appointment? Yes.    Agent: Please be advised that RX refills may take up to 3 business days. We ask that you follow-up with your pharmacy.

## 2022-10-23 ENCOUNTER — Other Ambulatory Visit: Payer: Self-pay | Admitting: Family Medicine

## 2022-10-23 ENCOUNTER — Telehealth: Payer: Self-pay | Admitting: Family Medicine

## 2022-10-23 MED ORDER — ALPRAZOLAM 0.5 MG PO TABS: 0.5000 mg | ORAL_TABLET | Freq: Two times a day (BID) | ORAL | 0 refills | Status: DC | PRN

## 2022-10-23 NOTE — Telephone Encounter (Signed)
Pharmacy requesitng refill of Alprazolam 0.'5mg'$  qty 60 - 1 x 2daily PRN   Last refill 06/22/22 Next appt Dr. Quentin Cornwall 2/27

## 2022-10-28 ENCOUNTER — Other Ambulatory Visit: Payer: Self-pay | Admitting: Family Medicine

## 2022-10-28 DIAGNOSIS — M5431 Sciatica, right side: Secondary | ICD-10-CM

## 2022-10-28 DIAGNOSIS — M79604 Pain in right leg: Secondary | ICD-10-CM

## 2022-10-31 ENCOUNTER — Ambulatory Visit: Payer: Medicare HMO | Admitting: Dermatology

## 2022-11-01 ENCOUNTER — Other Ambulatory Visit: Payer: Self-pay | Admitting: Physician Assistant

## 2022-11-01 ENCOUNTER — Ambulatory Visit: Payer: Medicare HMO | Admitting: Dermatology

## 2022-11-07 DIAGNOSIS — E10649 Type 1 diabetes mellitus with hypoglycemia without coma: Secondary | ICD-10-CM | POA: Diagnosis not present

## 2022-11-20 ENCOUNTER — Other Ambulatory Visit: Payer: Self-pay | Admitting: Family Medicine

## 2022-11-27 ENCOUNTER — Other Ambulatory Visit: Payer: Self-pay | Admitting: Family Medicine

## 2022-11-27 DIAGNOSIS — M79604 Pain in right leg: Secondary | ICD-10-CM

## 2022-11-27 DIAGNOSIS — M5431 Sciatica, right side: Secondary | ICD-10-CM

## 2022-11-28 ENCOUNTER — Ambulatory Visit: Payer: Medicare HMO | Admitting: Family Medicine

## 2022-12-20 DIAGNOSIS — N1832 Chronic kidney disease, stage 3b: Secondary | ICD-10-CM | POA: Diagnosis not present

## 2022-12-20 DIAGNOSIS — E7800 Pure hypercholesterolemia, unspecified: Secondary | ICD-10-CM | POA: Diagnosis not present

## 2022-12-20 DIAGNOSIS — Z87891 Personal history of nicotine dependence: Secondary | ICD-10-CM | POA: Diagnosis not present

## 2022-12-20 DIAGNOSIS — F419 Anxiety disorder, unspecified: Secondary | ICD-10-CM | POA: Diagnosis not present

## 2022-12-20 DIAGNOSIS — E1022 Type 1 diabetes mellitus with diabetic chronic kidney disease: Secondary | ICD-10-CM | POA: Diagnosis not present

## 2022-12-20 DIAGNOSIS — I700 Atherosclerosis of aorta: Secondary | ICD-10-CM | POA: Diagnosis not present

## 2022-12-20 DIAGNOSIS — E039 Hypothyroidism, unspecified: Secondary | ICD-10-CM | POA: Diagnosis not present

## 2022-12-21 DIAGNOSIS — E1022 Type 1 diabetes mellitus with diabetic chronic kidney disease: Secondary | ICD-10-CM | POA: Diagnosis not present

## 2022-12-21 DIAGNOSIS — E109 Type 1 diabetes mellitus without complications: Secondary | ICD-10-CM | POA: Diagnosis not present

## 2022-12-21 DIAGNOSIS — N1832 Chronic kidney disease, stage 3b: Secondary | ICD-10-CM | POA: Diagnosis not present

## 2022-12-28 DIAGNOSIS — E109 Type 1 diabetes mellitus without complications: Secondary | ICD-10-CM | POA: Diagnosis not present

## 2022-12-28 DIAGNOSIS — E10649 Type 1 diabetes mellitus with hypoglycemia without coma: Secondary | ICD-10-CM | POA: Diagnosis not present

## 2022-12-28 DIAGNOSIS — E1022 Type 1 diabetes mellitus with diabetic chronic kidney disease: Secondary | ICD-10-CM | POA: Diagnosis not present

## 2022-12-28 DIAGNOSIS — N1831 Chronic kidney disease, stage 3a: Secondary | ICD-10-CM | POA: Diagnosis not present

## 2023-01-05 ENCOUNTER — Ambulatory Visit: Payer: Medicare HMO

## 2023-01-15 ENCOUNTER — Other Ambulatory Visit: Payer: Self-pay | Admitting: Family Medicine

## 2023-01-15 NOTE — Telephone Encounter (Signed)
Patient is established with Debbie Dennis in [facility]

## 2023-01-16 DIAGNOSIS — M810 Age-related osteoporosis without current pathological fracture: Secondary | ICD-10-CM | POA: Diagnosis not present

## 2023-01-16 DIAGNOSIS — E559 Vitamin D deficiency, unspecified: Secondary | ICD-10-CM | POA: Diagnosis not present

## 2023-01-16 DIAGNOSIS — R5383 Other fatigue: Secondary | ICD-10-CM | POA: Diagnosis not present

## 2023-01-19 ENCOUNTER — Other Ambulatory Visit: Payer: Self-pay | Admitting: Sports Medicine

## 2023-01-19 DIAGNOSIS — M810 Age-related osteoporosis without current pathological fracture: Secondary | ICD-10-CM

## 2023-01-22 ENCOUNTER — Other Ambulatory Visit: Payer: Self-pay

## 2023-01-22 ENCOUNTER — Telehealth: Payer: Self-pay | Admitting: Pharmacy Technician

## 2023-01-22 DIAGNOSIS — M810 Age-related osteoporosis without current pathological fracture: Secondary | ICD-10-CM | POA: Insufficient documentation

## 2023-01-22 NOTE — Telephone Encounter (Signed)
Auth Submission: APPROVED Change in site updated  Site of care: Site of care: [facility] Payer: [employer] Medication & CPT/J Code(s) submitted: Prolia (Denosumab) [id number] Route of submission (phone, fax, portal):  Phone #[phone] Fax #[phone] Auth type: Buy/Bill Units/visits requested: x2 Reference number: [id number] Approval from: 01/26/21 to 10/02/23

## 2023-01-30 ENCOUNTER — Other Ambulatory Visit: Payer: Self-pay | Admitting: Family Medicine

## 2023-01-31 NOTE — Telephone Encounter (Signed)
Unable to refill per protocol, last refill by another provider not a this practice.   Requested Prescriptions  Pending Prescriptions Disp Refills   losartan (COZAAR) 25 MG tablet [Pharmacy Med Name: Losartan Potassium 25 MG Oral Tablet] 90 tablet 0    Sig: Take 1 tablet by mouth once daily     Cardiovascular:  Angiotensin Receptor Blockers Failed - 01/30/2023  2:40 PM      Failed - Cr in normal range and within 180 days    Creatinine, Ser  Date Value Ref Range Status  07/05/2022 1.16 (H) 0.44 - 1.00 mg/dL Final         Failed - K in normal range and within 180 days    Potassium  Date Value Ref Range Status  07/05/2022 3.8 3.5 - 5.1 mmol/L Final         Failed - Valid encounter within last 6 months    Recent Outpatient Visits           8 months ago Hypercalcemia   [facility] Bosie Clos, MD   8 months ago Annual physical exam   [facility] Bosie Clos, MD   1 year ago Essential hypertension   [facility] Bosie Clos, MD   1 year ago Encounter for Medicare annual wellness exam   [facility] Bosie Clos, MD   2 years ago Primary hypertension   [facility] Bosie Clos, MD              Passed - Patient is not pregnant      Passed - Last BP in normal range    BP Readings from Last 1 Encounters:  07/05/22 131/81

## 2023-02-05 ENCOUNTER — Other Ambulatory Visit: Payer: Self-pay | Admitting: Family Medicine

## 2023-02-05 DIAGNOSIS — E10649 Type 1 diabetes mellitus with hypoglycemia without coma: Secondary | ICD-10-CM | POA: Diagnosis not present

## 2023-03-09 ENCOUNTER — Encounter (HOSPITAL_COMMUNITY)
Admission: RE | Admit: 2023-03-09 | Discharge: 2023-03-09 | Disposition: A | Discharge status: Home / Self Care | Payer: Medicare HMO | Source: Ambulatory Visit | Attending: Sports Medicine | Admitting: Sports Medicine

## 2023-03-09 VITALS — BP 121/64 | HR 85 | Temp 98.4°F | Resp 16

## 2023-03-09 DIAGNOSIS — M810 Age-related osteoporosis without current pathological fracture: Secondary | ICD-10-CM | POA: Diagnosis not present

## 2023-03-09 MED ORDER — DENOSUMAB 60 MG/ML ~~LOC~~ SOSY
60.0000 mg | PREFILLED_SYRINGE | Freq: Once | SUBCUTANEOUS | Status: AC
  Administered 2023-03-09: 60 mg via SUBCUTANEOUS

## 2023-03-09 NOTE — Progress Notes (Signed)
Diagnosis: Osteoporosis  Provider:  Albertha Ghee MD  Procedure: Injection  Prolia (Denosumab), Dose: 60 mg, Site: subcutaneous, Number of injections: 1  Post Care: Patient declined observation  Discharge: Condition: Good, Destination: Home . AVS Declined  Performed by:  Fritzi Mandes, RN

## 2023-03-26 ENCOUNTER — Ambulatory Visit
Admission: RE | Admit: 2023-03-26 | Discharge: 2023-03-26 | Disposition: A | Discharge status: Home / Self Care | Payer: Medicare HMO | Source: Ambulatory Visit | Attending: Sports Medicine | Admitting: Sports Medicine

## 2023-03-26 DIAGNOSIS — M8589 Other specified disorders of bone density and structure, multiple sites: Secondary | ICD-10-CM | POA: Diagnosis not present

## 2023-03-26 DIAGNOSIS — M810 Age-related osteoporosis without current pathological fracture: Secondary | ICD-10-CM | POA: Insufficient documentation

## 2023-04-18 ENCOUNTER — Other Ambulatory Visit: Payer: Self-pay

## 2023-04-23 DIAGNOSIS — E109 Type 1 diabetes mellitus without complications: Secondary | ICD-10-CM | POA: Diagnosis not present

## 2023-04-30 DIAGNOSIS — E1022 Type 1 diabetes mellitus with diabetic chronic kidney disease: Secondary | ICD-10-CM | POA: Diagnosis not present

## 2023-04-30 DIAGNOSIS — E109 Type 1 diabetes mellitus without complications: Secondary | ICD-10-CM | POA: Diagnosis not present

## 2023-04-30 DIAGNOSIS — E10649 Type 1 diabetes mellitus with hypoglycemia without coma: Secondary | ICD-10-CM | POA: Diagnosis not present

## 2023-04-30 DIAGNOSIS — N1831 Chronic kidney disease, stage 3a: Secondary | ICD-10-CM | POA: Diagnosis not present

## 2023-05-06 DIAGNOSIS — E10649 Type 1 diabetes mellitus with hypoglycemia without coma: Secondary | ICD-10-CM | POA: Diagnosis not present

## 2023-05-22 DIAGNOSIS — N1832 Chronic kidney disease, stage 3b: Secondary | ICD-10-CM | POA: Diagnosis not present

## 2023-05-22 DIAGNOSIS — I700 Atherosclerosis of aorta: Secondary | ICD-10-CM | POA: Diagnosis not present

## 2023-05-22 DIAGNOSIS — I129 Hypertensive chronic kidney disease with stage 1 through stage 4 chronic kidney disease, or unspecified chronic kidney disease: Secondary | ICD-10-CM | POA: Diagnosis not present

## 2023-05-22 DIAGNOSIS — M810 Age-related osteoporosis without current pathological fracture: Secondary | ICD-10-CM | POA: Diagnosis not present

## 2023-05-22 DIAGNOSIS — E7800 Pure hypercholesterolemia, unspecified: Secondary | ICD-10-CM | POA: Diagnosis not present

## 2023-05-22 DIAGNOSIS — Z1389 Encounter for screening for other disorder: Secondary | ICD-10-CM | POA: Diagnosis not present

## 2023-05-22 DIAGNOSIS — Z0000 Encounter for general adult medical examination without abnormal findings: Secondary | ICD-10-CM | POA: Diagnosis not present

## 2023-05-22 DIAGNOSIS — E039 Hypothyroidism, unspecified: Secondary | ICD-10-CM | POA: Diagnosis not present

## 2023-05-22 DIAGNOSIS — Z8669 Personal history of other diseases of the nervous system and sense organs: Secondary | ICD-10-CM | POA: Diagnosis not present

## 2023-05-22 DIAGNOSIS — E1022 Type 1 diabetes mellitus with diabetic chronic kidney disease: Secondary | ICD-10-CM | POA: Diagnosis not present

## 2023-05-24 ENCOUNTER — Other Ambulatory Visit: Payer: Self-pay

## 2023-05-28 ENCOUNTER — Telehealth: Payer: Self-pay

## 2023-05-28 NOTE — Telephone Encounter (Signed)
Auth Submission: APPROVED Site of care: Site of care: [facility] Payer: [employer] Medication & CPT/J Code(s) submitted: Prolia (Denosumab) [id number] Route of submission (phone, fax, portal): Availity Portal  Auth type: Buy/Bill PB Units/visits requested: 60mg  q 6 months Reference number: [id number] Approval from: 01/26/21 to 10/02/23

## 2023-07-04 ENCOUNTER — Other Ambulatory Visit: Payer: Self-pay | Admitting: Family Medicine

## 2023-07-04 DIAGNOSIS — Z1231 Encounter for screening mammogram for malignant neoplasm of breast: Secondary | ICD-10-CM

## 2023-07-19 ENCOUNTER — Ambulatory Visit
Admission: RE | Admit: 2023-07-19 | Discharge: 2023-07-19 | Disposition: A | Discharge status: Home / Self Care | Payer: Medicare HMO | Source: Ambulatory Visit | Attending: Family Medicine | Admitting: Family Medicine

## 2023-07-19 DIAGNOSIS — Z1231 Encounter for screening mammogram for malignant neoplasm of breast: Secondary | ICD-10-CM | POA: Diagnosis not present

## 2023-08-07 DIAGNOSIS — E10649 Type 1 diabetes mellitus with hypoglycemia without coma: Secondary | ICD-10-CM | POA: Diagnosis not present

## 2023-08-15 DIAGNOSIS — M810 Age-related osteoporosis without current pathological fracture: Secondary | ICD-10-CM | POA: Diagnosis not present

## 2023-08-15 DIAGNOSIS — R5383 Other fatigue: Secondary | ICD-10-CM | POA: Diagnosis not present

## 2023-08-15 DIAGNOSIS — E559 Vitamin D deficiency, unspecified: Secondary | ICD-10-CM | POA: Diagnosis not present

## 2023-08-24 ENCOUNTER — Telehealth: Payer: Self-pay | Admitting: Family Medicine

## 2023-08-24 NOTE — Telephone Encounter (Signed)
Opened in error

## 2023-08-29 DIAGNOSIS — E10649 Type 1 diabetes mellitus with hypoglycemia without coma: Secondary | ICD-10-CM | POA: Diagnosis not present

## 2023-09-06 DIAGNOSIS — N1832 Chronic kidney disease, stage 3b: Secondary | ICD-10-CM | POA: Diagnosis not present

## 2023-09-06 DIAGNOSIS — E1022 Type 1 diabetes mellitus with diabetic chronic kidney disease: Secondary | ICD-10-CM | POA: Diagnosis not present

## 2023-09-06 DIAGNOSIS — E109 Type 1 diabetes mellitus without complications: Secondary | ICD-10-CM | POA: Diagnosis not present

## 2023-09-10 ENCOUNTER — Encounter (HOSPITAL_COMMUNITY): Admission: RE | Admit: 2023-09-10 | Payer: Medicare HMO | Source: Ambulatory Visit

## 2023-09-10 ENCOUNTER — Encounter: Payer: Medicare HMO | Attending: Sports Medicine | Admitting: *Deleted

## 2023-09-10 VITALS — BP 146/69 | HR 109 | Temp 97.7°F | Resp 16

## 2023-09-10 DIAGNOSIS — M810 Age-related osteoporosis without current pathological fracture: Secondary | ICD-10-CM | POA: Diagnosis not present

## 2023-09-10 MED ORDER — DENOSUMAB 60 MG/ML ~~LOC~~ SOSY
60.0000 mg | PREFILLED_SYRINGE | Freq: Once | SUBCUTANEOUS | Status: AC
  Administered 2023-09-10: 60 mg via SUBCUTANEOUS

## 2023-09-10 NOTE — Progress Notes (Signed)
Diagnosis: Osteoporosis  Provider:  Albertha Ghee MD  Procedure: Injection  Prolia (Denosumab), Dose: 60 mg, Site: subcutaneous, Number of injections: 1  Injection Site(s): Left arm  Post Care: Observation period completed  Discharge: Condition: Good, Destination: Home . AVS Provided  Performed by:  Daleen Squibb, RN

## 2023-09-13 ENCOUNTER — Ambulatory Visit: Payer: Medicare HMO

## 2023-09-27 DIAGNOSIS — E103392 Type 1 diabetes mellitus with moderate nonproliferative diabetic retinopathy without macular edema, left eye: Secondary | ICD-10-CM | POA: Diagnosis not present

## 2023-09-27 DIAGNOSIS — Z0100 Encounter for examination of eyes and vision without abnormal findings: Secondary | ICD-10-CM | POA: Diagnosis not present

## 2023-09-27 DIAGNOSIS — H43813 Vitreous degeneration, bilateral: Secondary | ICD-10-CM | POA: Diagnosis not present

## 2023-09-27 DIAGNOSIS — H26493 Other secondary cataract, bilateral: Secondary | ICD-10-CM | POA: Diagnosis not present

## 2023-10-08 ENCOUNTER — Other Ambulatory Visit: Payer: Self-pay | Admitting: Sports Medicine

## 2023-10-25 ENCOUNTER — Telehealth: Payer: Self-pay

## 2023-10-25 NOTE — Telephone Encounter (Signed)
Auth Submission: APPROVED Site of care: [facility] of care: [facility] Payer: humana medicare Medication & CPT/J Code(s) submitted: Prolia (Denosumab) [id number] Route of submission (phone, fax, portal): fax Phone # Fax # Auth type: Buy/Bill PB Units/visits requested: 60mg , q67months Reference number: [id number] Approval from: 10/24/23 to 10/01/24

## 2023-11-05 DIAGNOSIS — E10649 Type 1 diabetes mellitus with hypoglycemia without coma: Secondary | ICD-10-CM | POA: Diagnosis not present

## 2024-02-03 DIAGNOSIS — E10649 Type 1 diabetes mellitus with hypoglycemia without coma: Secondary | ICD-10-CM | POA: Diagnosis not present

## 2024-02-18 ENCOUNTER — Other Ambulatory Visit: Payer: Self-pay | Admitting: Sports Medicine

## 2024-03-04 DIAGNOSIS — R5383 Other fatigue: Secondary | ICD-10-CM | POA: Diagnosis not present

## 2024-03-04 DIAGNOSIS — M810 Age-related osteoporosis without current pathological fracture: Secondary | ICD-10-CM | POA: Diagnosis not present

## 2024-03-04 DIAGNOSIS — E559 Vitamin D deficiency, unspecified: Secondary | ICD-10-CM | POA: Diagnosis not present

## 2024-03-10 DIAGNOSIS — E10649 Type 1 diabetes mellitus with hypoglycemia without coma: Secondary | ICD-10-CM | POA: Diagnosis not present

## 2024-03-10 DIAGNOSIS — E109 Type 1 diabetes mellitus without complications: Secondary | ICD-10-CM | POA: Diagnosis not present

## 2024-03-11 ENCOUNTER — Ambulatory Visit: Payer: Medicare HMO

## 2024-03-11 ENCOUNTER — Other Ambulatory Visit: Payer: Self-pay | Admitting: Sports Medicine

## 2024-03-13 DIAGNOSIS — E1022 Type 1 diabetes mellitus with diabetic chronic kidney disease: Secondary | ICD-10-CM | POA: Diagnosis not present

## 2024-03-13 DIAGNOSIS — E10649 Type 1 diabetes mellitus with hypoglycemia without coma: Secondary | ICD-10-CM | POA: Diagnosis not present

## 2024-03-13 DIAGNOSIS — N1832 Chronic kidney disease, stage 3b: Secondary | ICD-10-CM | POA: Diagnosis not present

## 2024-03-13 DIAGNOSIS — E109 Type 1 diabetes mellitus without complications: Secondary | ICD-10-CM | POA: Diagnosis not present

## 2024-03-17 ENCOUNTER — Encounter: Attending: Sports Medicine | Admitting: Internal Medicine

## 2024-03-17 VITALS — BP 147/78 | HR 85 | Temp 97.6°F | Resp 18

## 2024-03-17 DIAGNOSIS — M810 Age-related osteoporosis without current pathological fracture: Secondary | ICD-10-CM

## 2024-03-17 MED ORDER — DENOSUMAB 60 MG/ML ~~LOC~~ SOSY
60.0000 mg | PREFILLED_SYRINGE | Freq: Once | SUBCUTANEOUS | Status: AC
  Administered 2024-03-17: 60 mg via SUBCUTANEOUS

## 2024-03-17 NOTE — Progress Notes (Signed)
 Diagnosis: Osteoporosis  Provider:  Ezella Holly MD  Procedure: Injection  Prolia  (Denosumab ), Dose: 60 mg, Site: subcutaneous, Number of injections: 1  Injection Site(s): Left arm  Post Care: Observation period completed  Discharge: Condition: Good, Destination: Home . AVS Provided  Performed by:  Laqueta Plane, LPN

## 2024-05-03 DIAGNOSIS — E10649 Type 1 diabetes mellitus with hypoglycemia without coma: Secondary | ICD-10-CM | POA: Diagnosis not present

## 2024-05-22 DIAGNOSIS — Z1331 Encounter for screening for depression: Secondary | ICD-10-CM | POA: Diagnosis not present

## 2024-05-22 DIAGNOSIS — Z87891 Personal history of nicotine dependence: Secondary | ICD-10-CM | POA: Diagnosis not present

## 2024-05-22 DIAGNOSIS — Z8669 Personal history of other diseases of the nervous system and sense organs: Secondary | ICD-10-CM | POA: Diagnosis not present

## 2024-05-22 DIAGNOSIS — E7800 Pure hypercholesterolemia, unspecified: Secondary | ICD-10-CM | POA: Diagnosis not present

## 2024-05-22 DIAGNOSIS — G2581 Restless legs syndrome: Secondary | ICD-10-CM | POA: Diagnosis not present

## 2024-05-22 DIAGNOSIS — Z8659 Personal history of other mental and behavioral disorders: Secondary | ICD-10-CM | POA: Diagnosis not present

## 2024-05-22 DIAGNOSIS — J309 Allergic rhinitis, unspecified: Secondary | ICD-10-CM | POA: Diagnosis not present

## 2024-05-22 DIAGNOSIS — E1022 Type 1 diabetes mellitus with diabetic chronic kidney disease: Secondary | ICD-10-CM | POA: Diagnosis not present

## 2024-05-22 DIAGNOSIS — Z0000 Encounter for general adult medical examination without abnormal findings: Secondary | ICD-10-CM | POA: Diagnosis not present

## 2024-07-03 ENCOUNTER — Other Ambulatory Visit: Payer: Self-pay | Admitting: Family Medicine

## 2024-07-03 DIAGNOSIS — Z1231 Encounter for screening mammogram for malignant neoplasm of breast: Secondary | ICD-10-CM

## 2024-07-22 DIAGNOSIS — E109 Type 1 diabetes mellitus without complications: Secondary | ICD-10-CM | POA: Diagnosis not present

## 2024-07-22 DIAGNOSIS — M5416 Radiculopathy, lumbar region: Secondary | ICD-10-CM | POA: Diagnosis not present

## 2024-07-22 DIAGNOSIS — Z23 Encounter for immunization: Secondary | ICD-10-CM | POA: Diagnosis not present

## 2024-08-01 DIAGNOSIS — E10649 Type 1 diabetes mellitus with hypoglycemia without coma: Secondary | ICD-10-CM | POA: Diagnosis not present

## 2024-08-19 ENCOUNTER — Ambulatory Visit
Admission: RE | Admit: 2024-08-19 | Discharge: 2024-08-19 | Disposition: A | Discharge status: Home / Self Care | Source: Ambulatory Visit | Attending: Family Medicine | Admitting: Family Medicine

## 2024-08-19 DIAGNOSIS — Z1231 Encounter for screening mammogram for malignant neoplasm of breast: Secondary | ICD-10-CM | POA: Diagnosis not present

## 2024-09-17 ENCOUNTER — Encounter: Attending: Sports Medicine | Admitting: *Deleted

## 2024-09-17 VITALS — BP 135/72 | HR 106 | Temp 97.3°F | Resp 16

## 2024-09-17 DIAGNOSIS — M810 Age-related osteoporosis without current pathological fracture: Secondary | ICD-10-CM | POA: Diagnosis not present

## 2024-09-17 MED ORDER — DENOSUMAB 60 MG/ML ~~LOC~~ SOSY
60.0000 mg | PREFILLED_SYRINGE | Freq: Once | SUBCUTANEOUS | Status: AC
  Administered 2024-09-17: 14:00:00 60 mg via SUBCUTANEOUS

## 2024-09-17 NOTE — Progress Notes (Signed)
 Diagnosis: Osteoporosis  Provider:  Albertha Ghee MD  Procedure: Injection  Prolia (Denosumab), Dose: 60 mg, Site: subcutaneous, Number of injections: 1  Injection Site(s): Left arm  Post Care: Observation period completed  Discharge: Condition: Good, Destination: Home . AVS Provided  Performed by:  Daleen Squibb, RN

## 2024-10-13 ENCOUNTER — Other Ambulatory Visit: Payer: Self-pay | Admitting: Family Medicine

## 2024-10-13 ENCOUNTER — Encounter: Payer: Self-pay | Admitting: Family Medicine

## 2024-10-13 DIAGNOSIS — Z122 Encounter for screening for malignant neoplasm of respiratory organs: Secondary | ICD-10-CM

## 2025-03-19 ENCOUNTER — Ambulatory Visit
